# Patient Record
Sex: Female | Born: 1940 | Race: White | Hispanic: No | Smoking: Current every day smoker
Health system: Southern US, Community
[De-identification: ages and names within clinical notes are randomized; demographics above are authoritative.]

## PROBLEM LIST (undated history)

## (undated) DIAGNOSIS — R7309 Other abnormal glucose: Secondary | ICD-10-CM

## (undated) DIAGNOSIS — Z720 Tobacco use: Secondary | ICD-10-CM

## (undated) DIAGNOSIS — I700 Atherosclerosis of aorta: Secondary | ICD-10-CM

## (undated) DIAGNOSIS — R52 Pain, unspecified: Secondary | ICD-10-CM

## (undated) DIAGNOSIS — I2510 Atherosclerotic heart disease of native coronary artery without angina pectoris: Secondary | ICD-10-CM

## (undated) DIAGNOSIS — R0600 Dyspnea, unspecified: Secondary | ICD-10-CM

## (undated) DIAGNOSIS — M1990 Unspecified osteoarthritis, unspecified site: Secondary | ICD-10-CM

## (undated) DIAGNOSIS — J449 Chronic obstructive pulmonary disease, unspecified: Secondary | ICD-10-CM

## (undated) HISTORY — DX: Atherosclerotic heart disease of native coronary artery without angina pectoris: I25.10

## (undated) HISTORY — DX: Unspecified osteoarthritis, unspecified site: M19.90

## (undated) HISTORY — DX: Other abnormal glucose: R73.09

## (undated) HISTORY — DX: Chronic obstructive pulmonary disease, unspecified: J44.9

## (undated) HISTORY — PX: MULTIPLE TOOTH EXTRACTIONS: SHX2053

## (undated) HISTORY — DX: Tobacco use: Z72.0

## (undated) HISTORY — DX: Atherosclerosis of aorta: I70.0

## (undated) HISTORY — PX: EYE SURGERY: SHX253

---
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
## 1966-07-22 HISTORY — PX: OTHER SURGICAL HISTORY: SHX169

## 1974-07-22 HISTORY — PX: ABDOMINAL HYSTERECTOMY: SHX81

## 1983-07-23 HISTORY — PX: OTHER SURGICAL HISTORY: SHX169

## 1988-07-22 HISTORY — PX: BREAST SURGERY: SHX581

## 1988-07-22 HISTORY — PX: MASTECTOMY: SHX3

## 1998-10-20 ENCOUNTER — Encounter: Payer: Self-pay | Admitting: Internal Medicine

## 1998-10-20 ENCOUNTER — Ambulatory Visit (HOSPITAL_COMMUNITY): Admission: RE | Admit: 1998-10-20 | Discharge: 1998-10-20 | Payer: Self-pay | Admitting: Internal Medicine

## 1999-12-07 ENCOUNTER — Encounter: Payer: Self-pay | Admitting: Internal Medicine

## 1999-12-07 ENCOUNTER — Ambulatory Visit (HOSPITAL_COMMUNITY): Admission: RE | Admit: 1999-12-07 | Discharge: 1999-12-07 | Payer: Self-pay | Admitting: Internal Medicine

## 2000-12-26 ENCOUNTER — Ambulatory Visit (HOSPITAL_COMMUNITY): Admission: RE | Admit: 2000-12-26 | Discharge: 2000-12-26 | Payer: Self-pay | Admitting: Internal Medicine

## 2000-12-26 ENCOUNTER — Encounter: Payer: Self-pay | Admitting: Internal Medicine

## 2001-09-25 ENCOUNTER — Encounter: Payer: Self-pay | Admitting: Internal Medicine

## 2001-09-25 ENCOUNTER — Ambulatory Visit (HOSPITAL_COMMUNITY): Admission: RE | Admit: 2001-09-25 | Discharge: 2001-09-25 | Payer: Self-pay | Admitting: Internal Medicine

## 2002-01-01 ENCOUNTER — Encounter: Payer: Self-pay | Admitting: Internal Medicine

## 2002-01-01 ENCOUNTER — Ambulatory Visit (HOSPITAL_COMMUNITY): Admission: RE | Admit: 2002-01-01 | Discharge: 2002-01-01 | Payer: Self-pay | Admitting: Internal Medicine

## 2002-10-15 ENCOUNTER — Ambulatory Visit (HOSPITAL_COMMUNITY): Admission: RE | Admit: 2002-10-15 | Discharge: 2002-10-15 | Payer: Self-pay | Admitting: Internal Medicine

## 2002-10-15 ENCOUNTER — Encounter: Payer: Self-pay | Admitting: Internal Medicine

## 2003-01-28 ENCOUNTER — Ambulatory Visit (HOSPITAL_COMMUNITY): Admission: RE | Admit: 2003-01-28 | Discharge: 2003-01-28 | Payer: Self-pay | Admitting: Internal Medicine

## 2003-01-28 ENCOUNTER — Encounter: Payer: Self-pay | Admitting: Internal Medicine

## 2003-07-23 HISTORY — PX: BACK SURGERY: SHX140

## 2003-10-28 ENCOUNTER — Ambulatory Visit (HOSPITAL_COMMUNITY): Admission: RE | Admit: 2003-10-28 | Discharge: 2003-10-28 | Payer: Self-pay

## 2004-02-14 ENCOUNTER — Ambulatory Visit (HOSPITAL_COMMUNITY): Admission: RE | Admit: 2004-02-14 | Discharge: 2004-02-14 | Payer: Self-pay | Admitting: Internal Medicine

## 2004-03-06 ENCOUNTER — Ambulatory Visit (HOSPITAL_COMMUNITY): Admission: RE | Admit: 2004-03-06 | Discharge: 2004-03-06 | Payer: Self-pay | Admitting: Internal Medicine

## 2004-03-14 ENCOUNTER — Ambulatory Visit (HOSPITAL_COMMUNITY): Admission: RE | Admit: 2004-03-14 | Discharge: 2004-03-14 | Payer: Self-pay | Admitting: Internal Medicine

## 2004-07-22 HISTORY — PX: CHOLECYSTECTOMY: SHX55

## 2004-12-14 ENCOUNTER — Ambulatory Visit (HOSPITAL_COMMUNITY): Admission: RE | Admit: 2004-12-14 | Discharge: 2004-12-14 | Payer: Self-pay | Admitting: Internal Medicine

## 2005-01-24 ENCOUNTER — Ambulatory Visit (HOSPITAL_COMMUNITY): Admission: RE | Admit: 2005-01-24 | Discharge: 2005-01-24 | Payer: Self-pay | Admitting: Internal Medicine

## 2005-02-14 ENCOUNTER — Encounter: Admission: RE | Admit: 2005-02-14 | Discharge: 2005-03-06 | Payer: Self-pay | Admitting: Family Medicine

## 2005-02-26 ENCOUNTER — Ambulatory Visit (HOSPITAL_COMMUNITY): Admission: RE | Admit: 2005-02-26 | Discharge: 2005-02-26 | Payer: Self-pay | Admitting: Internal Medicine

## 2005-04-28 ENCOUNTER — Emergency Department (HOSPITAL_COMMUNITY): Admission: EM | Admit: 2005-04-28 | Discharge: 2005-04-28 | Payer: Self-pay | Admitting: Emergency Medicine

## 2005-05-10 ENCOUNTER — Ambulatory Visit: Payer: Self-pay | Admitting: Hematology and Oncology

## 2005-05-16 ENCOUNTER — Encounter: Payer: Self-pay | Admitting: Hematology and Oncology

## 2005-05-16 ENCOUNTER — Other Ambulatory Visit: Admission: RE | Admit: 2005-05-16 | Discharge: 2005-05-16 | Payer: Self-pay | Admitting: Hematology and Oncology

## 2005-07-22 HISTORY — PX: SHOULDER SURGERY: SHX246

## 2005-11-02 ENCOUNTER — Encounter: Admission: RE | Admit: 2005-11-02 | Discharge: 2005-11-02 | Payer: Self-pay | Admitting: Sports Medicine

## 2005-11-20 ENCOUNTER — Ambulatory Visit (HOSPITAL_BASED_OUTPATIENT_CLINIC_OR_DEPARTMENT_OTHER): Admission: RE | Admit: 2005-11-20 | Discharge: 2005-11-20 | Payer: Self-pay | Admitting: Orthopedic Surgery

## 2006-03-28 ENCOUNTER — Ambulatory Visit (HOSPITAL_COMMUNITY): Admission: RE | Admit: 2006-03-28 | Discharge: 2006-03-28 | Payer: Self-pay | Admitting: Internal Medicine

## 2006-05-06 ENCOUNTER — Encounter: Admission: RE | Admit: 2006-05-06 | Discharge: 2006-05-06 | Payer: Self-pay | Admitting: Internal Medicine

## 2006-05-16 ENCOUNTER — Ambulatory Visit (HOSPITAL_COMMUNITY): Admission: RE | Admit: 2006-05-16 | Discharge: 2006-05-16 | Payer: Self-pay | Admitting: General Surgery

## 2006-07-22 HISTORY — PX: CHOLECYSTECTOMY: SHX55

## 2006-07-30 ENCOUNTER — Encounter (INDEPENDENT_AMBULATORY_CARE_PROVIDER_SITE_OTHER): Payer: Self-pay | Admitting: Specialist

## 2006-07-30 ENCOUNTER — Ambulatory Visit (HOSPITAL_COMMUNITY): Admission: RE | Admit: 2006-07-30 | Discharge: 2006-07-30 | Payer: Self-pay | Admitting: General Surgery

## 2006-12-30 ENCOUNTER — Ambulatory Visit (HOSPITAL_COMMUNITY): Admission: RE | Admit: 2006-12-30 | Discharge: 2006-12-30 | Payer: Self-pay | Admitting: Internal Medicine

## 2007-01-12 ENCOUNTER — Encounter: Admission: RE | Admit: 2007-01-12 | Discharge: 2007-01-12 | Payer: Self-pay | Admitting: Internal Medicine

## 2007-04-23 ENCOUNTER — Encounter: Admission: RE | Admit: 2007-04-23 | Discharge: 2007-04-23 | Payer: Self-pay | Admitting: Internal Medicine

## 2008-03-27 ENCOUNTER — Encounter: Admission: RE | Admit: 2008-03-27 | Discharge: 2008-03-27 | Payer: Self-pay | Admitting: Orthopedic Surgery

## 2008-05-03 ENCOUNTER — Inpatient Hospital Stay (HOSPITAL_COMMUNITY): Admission: RE | Admit: 2008-05-03 | Discharge: 2008-05-04 | Payer: Self-pay | Admitting: Neurosurgery

## 2009-03-03 ENCOUNTER — Ambulatory Visit (HOSPITAL_COMMUNITY): Admission: RE | Admit: 2009-03-03 | Discharge: 2009-03-03 | Payer: Self-pay | Admitting: Internal Medicine

## 2009-12-12 ENCOUNTER — Ambulatory Visit: Admission: RE | Admit: 2009-12-12 | Discharge: 2009-12-12 | Payer: Self-pay | Admitting: Internal Medicine

## 2009-12-14 ENCOUNTER — Encounter: Payer: Self-pay | Admitting: Internal Medicine

## 2009-12-14 ENCOUNTER — Encounter: Admission: RE | Admit: 2009-12-14 | Discharge: 2009-12-14 | Payer: Self-pay | Admitting: Internal Medicine

## 2010-01-01 ENCOUNTER — Ambulatory Visit (HOSPITAL_COMMUNITY): Admission: RE | Admit: 2010-01-01 | Discharge: 2010-01-01 | Payer: Self-pay | Admitting: Internal Medicine

## 2010-01-12 ENCOUNTER — Ambulatory Visit: Payer: Self-pay | Admitting: Internal Medicine

## 2010-01-12 DIAGNOSIS — F17200 Nicotine dependence, unspecified, uncomplicated: Secondary | ICD-10-CM | POA: Insufficient documentation

## 2010-01-12 DIAGNOSIS — I10 Essential (primary) hypertension: Secondary | ICD-10-CM | POA: Insufficient documentation

## 2010-01-12 DIAGNOSIS — E782 Mixed hyperlipidemia: Secondary | ICD-10-CM

## 2010-01-18 DIAGNOSIS — J302 Other seasonal allergic rhinitis: Secondary | ICD-10-CM

## 2010-01-18 DIAGNOSIS — J3089 Other allergic rhinitis: Secondary | ICD-10-CM

## 2010-02-12 ENCOUNTER — Ambulatory Visit: Payer: Self-pay | Admitting: Internal Medicine

## 2010-04-13 ENCOUNTER — Ambulatory Visit: Payer: Self-pay | Admitting: Internal Medicine

## 2010-04-30 ENCOUNTER — Ambulatory Visit (HOSPITAL_COMMUNITY): Admission: RE | Admit: 2010-04-30 | Discharge: 2010-04-30 | Payer: Self-pay | Admitting: Internal Medicine

## 2010-08-21 NOTE — Miscellaneous (Signed)
Summary: Orders Update pft charges  Clinical Lists Changes  Orders: Added new Service order of Carbon Monoxide diffusing w/capacity (94720) - Signed Added new Service order of Lung Volumes (94240) - Signed Added new Service order of Spirometry (Pre & Post) (94060) - Signed 

## 2010-08-21 NOTE — Assessment & Plan Note (Signed)
Summary: [name]///[name]   Primary Tiane Szydlowski/Referring Freeda Spivey:  Dr Oneta Rack  CC:  2 month follow up visit-Review PFT.[name]  History of Present Illness: History of Present Illness: January 12, 2010- 70yoF smoker seen courtesy of Dr Oneta Rack with complaint of cough. Long hx of chronic bronchitis. sinus congestion each witner. Denies hx asthma. Says she had pneumonia 2 weeks ago- given antibiotic and prednisone, but this time cough didn't clear. Worse at night and supine. Postnasal drip and easy dyspnea on exertion. Denies reflux. Taking ACE inhibitor. Denies blood, fever, swollen glands or chest pain. No cough with meals. CXR- 12/12/09- lingular streaking CT angio- 12/14/09- No PE, diffuse COPD. Mild cardiomegaly. No acute process or infiltrate.  February 12, 2010-  Allergic rhinitis, Tobacco, COPD Gets bronchitis easily. says in the last week she has been wheezing more, bvut otherwise she had  been breathing better since last here.  Sample Benicar seemed less associated with cough than her enalapril. She will follow up with Dr Oneta Rack for this long term. Spiriva sample helped. PFT was due today, but delayed because machine is being repaired. Still smoking 8 cigs/ day. We discussed gradually weaning off.  April 13, 2010- Allergic rhinitis, COPD, tobacco Feels ok. Hot summer was smothery and took Spiriva more regularly. Now uses it some days but doesn't always feel the need. Has a sample Ventolin HFA she uses up to two times a day some days, none on other days. Coughs only some days, not always productive. Denies being awakened, chest pain. Does get occasional palpitation./ skipped beat. Nasal congestion continues to bother her. Eyes itch. Has to pace hereslf, sit and rest while housecleaning or out shopping. Remembers Advair helping in past when she was given it during spell of bronchits.  PFT- mild obstructive disease w/ response to BD, DLCO severely reduced.   Preventive Screening-Counseling &  Management  Alcohol-Tobacco     Smoking Status: current     Smoking Cessation Counseling: yes     Smoke Cessation Stage: contemplative     Packs/Day: <0.25     Pack years: 52 years     Tobacco Counseling: to quit use of tobacco products  Current Medications (verified): 1)  Furosemide 40 Mg Tabs (Furosemide) .... Take 1 By Mouth Once Daily 2)  Losartan Potassium 100 Mg Tabs (Losartan Potassium) .... Take 1 By Mouth Once Daily 3)  Zetia 10 Mg Tabs (Ezetimibe) .... Take 1 By Mouth Once Daily 4)  Aspirin 81 Mg Tbec (Aspirin) .... Take 1 By Mouth Once Daily 5)  Fenofibrate Micronized 134 Mg Caps (Fenofibrate Micronized) .... Take 1 By Mouth Once Daily 6)  Magnesium Oxide 400 Mg Tabs (Magnesium Oxide) .... With Bone Health Take 1 By Mouth Once Daily 7)  Fish Oil 1000 Mg Caps (Omega-3 Fatty Acids) .... Take 1 By Mouth Two Times A Day 8)  Vitamin D3 2000 Unit Caps (Cholecalciferol) .... Take 2 By Mouth Once Daily 9)  Flax Seed Oil 1000 Mg Caps (Flaxseed (Linseed)) .... Take 1 By Mouth Three Times A Day 10)  Spiriva Handihaler 18 Mcg Caps (Tiotropium Bromide Monohydrate) .[name].. 1 Daily  Allergies (verified): 1)  ! * Acyclovir  Past History:  Past Medical History: Last updated: 01/12/2010 C V A / Stroke-1985 Hypertension Hyperlipidemia Tobacco Breast cancer, hx Allergic Rhinitis  Past Surgical History: Last updated: 01/12/2010 Back surgery-2005 Right mastectomy 1990, no adjuvant Cholecystectomy-2006 Hysterectomy-1976  Family History: Last updated: 01/12/2010 Emphysema/COPD-2 brothers, sister Allergies:all brothers and sisters Asthma: 2 sisters Heart Disease: mother, sisters,  brothers Cancer: pt(breast) mother(breast), 3 sisters(breast)  Social History: Last updated: 01/12/2010 married with children smokes age 70 to present 1/2 ppd 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  The patient denies shortness of breath at rest, non-productive cough, coughing up blood, chest pain, irregular heartbeats, acid heartburn, indigestion, loss of appetite, weight change, abdominal pain, difficulty swallowing, sore throat, tooth/dental problems, headaches, sneezing, itching, ear ache, rash, and fever.    Vital Signs:  Patient profile:   70 year old female Height:      64 inches Weight:      166 pounds BMI:     28.60 O2 Sat:      94 % on Room air Pulse rate:   72 / minute BP sitting:   124 / 76  (left arm) Cuff size:   regular  Vitals Entered By: Reynaldo Minium CMA (April 13, 2010 3:56 PM)  O2 Flow:  Room air CC: 2 month follow up visit-Review PFT.   Physical Exam  Additional Exam:  General: A/Ox3; pleasant and cooperative, NAD, SKIN: no rash, lesions NODES: no lymphadenopathy HEENT: [place]/AT, EOM- WNL, Conjuctivae- clear, PERRLA, TM-WNL, Nose- clear, Throat- clear and wnl, Mallampati  II NECK: Supple w/ fair ROM, JVD- none, normal carotid impulses w/o bruits Thyroid- CHEST: Decreased, mild lose cough, R mastectomy HEART: RRR, no m/g/r heard ABDOMEN: Soft and nl; JYN:WGNF, nl pulses, no edema . Right arm in elastic sleeve. NEURO: Grossly intact to observation      Impression & Recommendations:  Problem # 1:  COPD (ICD-496) I believe she has more COPD, as indicated by mild reversibility and her reduced DLCO, than the spirometry flows would suggest. She lives in [place] and doesn't have transportation to permit consideration of the [employer].  I will encourage her to walk a lot. We will let her try Advair again to see if it adds value.  Flu vax.  Problem # 2:  TOBACCO USER (ICD-305.1) She must stop smoking. I've discussed available support and suggested she use the patches.  Problem # 3:  ALLERGIC  RHINITIS (ICD-477.9) Recent seasonal exacerbation with nasal congestin and itching eyes. We are at peak ragweed levels now. Antinhistamines will likely be sufficient to get her through.  Medications Added to Medication List This Visit: 1)  Losartan Potassium 100 Mg Tabs (Losartan potassium) .... Take 1 by mouth once daily 2)  Advair Diskus 100-50 Mcg/dose Aepb (Fluticasone-salmeterol) .[name].. 1 puff and rinse mouth, twice daily 3)  Ventolin Hfa 108 (90 Base) Mcg/act Aers (Albuterol sulfate) .... 2 puffs four times a day as needed rescue inhaler  Other Orders: Est. Patient Level IV (62130)  Patient Instructions: 1)  Please schedule a follow-up appointment in 6 Gray. 2)  Sample / Script Advair 100/50 3)    1 puff and rinse mouth, twice  daily 4)     5)  Continue Spiriva, once daily 6)  Continue Ventolin HFA rescue inhaler/ albuterol 7)    2 puffs up to 4 x daily if needed 8)  Please keep trying to stop smoking 9)  Flu vax Prescriptions: VENTOLIN HFA 108 (90 BASE) MCG/ACT AERS (ALBUTEROL SULFATE) 2 puffs four times a day as needed rescue inhaler  #1 x prn   Entered and Authorized  by:   Waymon Budge MD   Signed by:   Waymon Budge MD on 04/13/2010   Method used:   Print then Give to Patient   RxID:   [id number] ADVAIR DISKUS 100-50 MCG/DOSE AEPB (FLUTICASONE-SALMETEROL) 1 puff and rinse mouth, twice daily  #1 x prn   Entered and Authorized by:   Waymon Budge MD   Signed by:   Waymon Budge MD on 04/13/2010   Method used:   Print then Give to Patient   RxID:   [id number]    Immunization History:  Influenza Immunization History:    Influenza:  historical (04/23/2009)  Pneumovax Immunization History:    Pneumovax:  historical (01/20/2010)

## 2010-08-21 NOTE — Assessment & Plan Note (Signed)
Summary: ROV AFTER PFT ///kp   Primary Provider/Referring Provider:  Dr Posey Boyer  CC:  1 month follow up.  Breathing and cough have improved however still has a dry cough and a lot of wheezing x 1 wk.  Still smoking 8-10 cig/day.Marland Kitchen  History of Present Illness: History of Present Illness: January 12, 2010- 70yoF smoker seen courtesy of Dr Oneta Rack with complaint of cough. Long hx of chronic bronchitis. sinus congestion each witner. Denies hx asthma. Says she had pneumonia 2 weeks ago- given antibiotic and prednisone, but this time cough didn't clear. Worse at night and supine. Postnasal drip and easy dyspnea on exertion. Denies reflux. Taking ACE inhibitor. Denies blood, fever, swollen glands or chest pain. No cough with meals. CXR- 12/12/09- lingular streaking CT angio- 12/14/09- No PE, diffuse COPD. Mild cardiomegaly. No acute process or infiltrate.  February 12, 2010-  Allergic rhinitis, Tobacco, COPD Gets bronchitis easily. says in the last week she has been wheezing more, bvut otherwise she had  been breathing better since last here.  Sample Benicar seemed less associated with cough than her enalapril. She will follow up with Dr Oneta Rack for this long term. Spiriva sample helped. PFT was due today, but delayed because machine is being repaired. Still smoking 8 cigs/ day. We discussed gradually weaning off.    Preventive Screening-Counseling & Management  Alcohol-Tobacco     Smoking Status: current     Smoke Cessation Stage: contemplative     Packs/Day: <0.25  Comments: Trying to quit now.  Current Medications (verified): 1)  Furosemide 40 Mg Tabs (Furosemide) .... Take 1 By Mouth Once Daily 2)  Benicar 20 Mg Tabs (Olmesartan Medoxomil) .... Take 1 Tablet By Mouth Once A Day 3)  Zetia 10 Mg Tabs (Ezetimibe) .... Take 1 By Mouth Once Daily 4)  Aspirin 81 Mg Tbec (Aspirin) .... Take 1 By Mouth Once Daily 5)  Fenofibrate Micronized 134 Mg Caps (Fenofibrate Micronized) .... Take 1 By Mouth  Once Daily 6)  Magnesium Oxide 400 Mg Tabs (Magnesium Oxide) .... With Bone Health Take 1 By Mouth Once Daily 7)  Fish Oil 1000 Mg Caps (Omega-3 Fatty Acids) .... Take 1 By Mouth Two Times A Day 8)  Vitamin D3 2000 Unit Caps (Cholecalciferol) .... Take 2 By Mouth Once Daily 9)  Flax Seed Oil 1000 Mg Caps (Flaxseed (Linseed)) .... Take 1 By Mouth Three Times A Day  Allergies (verified): 1)  ! * Acyclovir  Past History:  Past Medical History: Last updated: 01/12/2010 C V A / Stroke-1985 Hypertension Hyperlipidemia Tobacco Breast cancer, hx Allergic Rhinitis  Past Surgical History: Last updated: 01/12/2010 Back surgery-2005 Right mastectomy 1990, no adjuvant Cholecystectomy-2006 Hysterectomy-1976  Family History: Last updated: 01/12/2010 Emphysema/COPD-2 brothers, sister Allergies:all brothers and sisters Asthma: 2 sisters Heart Disease: mother, sisters, brothers Cancer: pt(breast) mother(breast), 3 sisters(breast)  Social History: Last updated: 01/12/2010 married with children smokes age 65 to present 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  The patient denies shortness of breath at rest, productive cough, non-productive cough, coughing up blood, chest pain, acid heartburn, indigestion, loss of appetite, weight change, abdominal pain, difficulty swallowing, sore throat, tooth/dental problems, headaches, nasal congestion/difficulty breathing through nose, and sneezing.    Vital Signs:  Patient profile:  70 year old female Height:      64 inches Weight:      165.25 pounds BMI:     28.47 O2 Sat:      97 % on Room air Pulse rate:   55 / minute BP sitting:   110 / 62  (left arm) Cuff size:   regular  Vitals Entered By: Gweneth Dimitri RN (February 12, 2010 4:14 PM)  O2 Flow:  Room air CC: 1 month follow up.  Breathing and cough have improved however still has a dry cough and a lot of wheezing x 1 wk.  Still smoking 8-10 cig/day. Comments Medications reviewed with patient Daytime contact number verified with patient. Gweneth Dimitri RN  February 12, 2010 4:16 PM    Physical Exam  Additional Exam:  General: A/Ox3; pleasant and cooperative, NAD, SKIN: no rash, lesions NODES: no lymphadenopathy HEENT: [place]/AT, EOM- WNL, Conjuctivae- clear, PERRLA, TM-WNL, Nose- clear, Throat- clear and wnl NECK: Supple w/ fair ROM, JVD- none, normal carotid impulses w/o bruits Thyroid- CHEST: Clear to P&A, no wheeze or cough here, R mastectomy HEART: RRR, no m/g/r heard ABDOMEN: Soft and nl; ZOX:WRUE, nl pulses, no edema . Right arm in elastic sleeve. NEURO: Grossly intact to observation      Impression & Recommendations:  Problem # 1:  COPD (ICD-496) We will refill Spiriva and reschedule her PFT Cough almost certainly was aggravated by enalapril based on her observation. We will give more  Benicar samples, but ask her to talk with Dr Oneta Rack about his long term preference.,  Problem # 2:  TOBACCO USER (ICD-305.1)  We talked again today about smoking cessation. She may be at a point where she can taper by 1-2 cigarettes/ week.  Problem # 3:  ALLERGIC RHINITIS (ICD-477.9)  Now is not peak seasonal allergy time. We will manage symptomatically as needed.  Medications Added to Medication List This Visit: 1)  Benicar 20 Mg Tabs (Olmesartan medoxomil) .... Take 1 tablet by mouth once a day 2)  Spiriva Handihaler 18 Mcg Caps (Tiotropium bromide monohydrate) .Marland Kitchen.. 1 daily  Other Orders: Est. Patient Level III (45409)  Patient Instructions: 1)  Please schedule a follow-up appointment in 2 months. 2)  Reschedule PFT to be done when machine is working again. 3)  Please keep trying to drop off those last few cigarettes. 4)  Sample and  script Spriva: 1 daily 5)  samples Benicar 20 x 3 weeks. One daily. Please ask Dr Oneta Rack what to do since the enalapril caused you to cough. Prescriptions: SPIRIVA HANDIHALER 18 MCG CAPS (TIOTROPIUM BROMIDE MONOHYDRATE) 1 daily  #30 x prn   Entered and Authorized by:   Waymon Budge MD   Signed by:   Waymon Budge MD on 02/12/2010   Method used:   Print then Give to Patient   RxID:   [id number]

## 2010-08-21 NOTE — Assessment & Plan Note (Signed)
Summary: chronic cough/ mbw   Vital Signs:  Patient profile:   70 year old female Weight:      162 pounds O2 Sat:      94 % on Room air Pulse rate:   60 / minute BP sitting:   124 / 84  (left arm) Cuff size:   regular  Vitals Entered By: Reynaldo Minium CMA (January 12, 2010 3:28 PM)  O2 Flow:  Room air CC: Pulmonary consult-Chronic cough-Dr. Oneta Rack.   Primary Provider/Referring Provider:  Dr Posey Boyer  CC:  Pulmonary consult-Chronic cough-Dr. Oneta Rack.Marland Kitchen  History of Present Illness: January 12, 2010- 68yoF smoker seen courtesy of Dr Oneta Rack with complaint of cough. Long hx of chronic bronchitis. sinus congestion each witner. Denies hx asthma. Says she had pneumonia 2 weeks ago- given antibiotic and prednisone, but this time cough didn't clear. Worse at night and supine. Postnasal drip and easy dyspnea on exertion. Denies reflux. Taking ACE inhibitor. Denies blood, fever, swollen glands or chest pain. No cough with meals. CXR- 12/12/09- lingular streaking CT angio- 12/14/09- No PE, diffuse COPD. Mild cardiomegaly. No acute process or infiltrate.   Preventive Screening-Counseling & Management  Alcohol-Tobacco     Smoking Status: current     Smoking Cessation Counseling: yes     Smoke Cessation Stage: contemplative     Packs/Day: 0.5     Pack years: 52 years     Tobacco Counseling: to quit use of tobacco products  Current Medications (verified): 1)  Furosemide 40 Mg Tabs (Furosemide) .... Take 1 By Mouth Once Daily 2)  Enalapril Maleate 20 Mg Tabs (Enalapril Maleate) .... Take 1 By Mouth Once Daily 3)  Zetia 10 Mg Tabs (Ezetimibe) .... Take 1 By Mouth Once Daily 4)  Aspirin 81 Mg Tbec (Aspirin) .... Take 1 By Mouth Once Daily 5)  Fenofibrate Micronized 134 Mg Caps (Fenofibrate Micronized) .... Take 1 By Mouth Once Daily 6)  Magnesium Oxide 400 Mg Tabs (Magnesium Oxide) .... With Bone Health Take 1 By Mouth Once Daily 7)  Fish Oil 1000 Mg Caps (Omega-3 Fatty Acids) .... Take 1 By Mouth  Two Times A Day 8)  Vitamin D3 2000 Unit Caps (Cholecalciferol) .... Take 2 By Mouth Once Daily 9)  Flax Seed Oil 1000 Mg Caps (Flaxseed (Linseed)) .... Take 1 By Mouth Three Times A Day  Allergies (verified): 1)  ! 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: Smoking Status: current (01/12/2010) Packs/Day: 0.5 (01/12/2010)  Past Medical History: C V A / Stroke-1985 Hypertension Hyperlipidemia Tobacco Breast cancer, hx Allergic Rhinitis  Past Surgical History: Back surgery-2005 Right mastectomy 1990, no adjuvant Cholecystectomy-2006 Hysterectomy-1976  Family History: Emphysema/COPD-2 brothers, sister Allergies:all brothers and sisters Asthma: 2 sisters Heart Disease: mother, sisters, brothers Cancer: pt(breast) mother(breast), 3 sisters(breast)  Social History: married with children smokes age 89 to present 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  The patient denies shortness of breath at rest, non-productive cough, coughing up blood,  chest pain, irregular heartbeats, acid heartburn, indigestion, loss of appetite, abdominal pain, difficulty swallowing, sore throat, tooth/dental problems, headaches, itching, ear ache, anxiety, depression, hand/feet swelling, joint stiffness or pain, rash, change in color of mucus, and fever.         Weight down 10 lbs  Physical  Exam  Additional Exam:  General: A/Ox3; pleasant and cooperative, NAD, SKIN: no rash, lesions NODES: no lymphadenopathy HEENT: [place]/AT, EOM- WNL, Conjuctivae- clear, PERRLA, TM-WNL, Nose- clear, Throat- clear and wnl NECK: Supple w/ fair ROM, JVD- none, normal carotid impulses w/o bruits Thyroid- normal to palpation CHEST: Clear to P&A, no wheeze or cough here, R mastectomy HEART: RRR, no m/g/r heard ABDOMEN: Soft and nl; nml bowel sounds; no organomegaly or masses noted ZOX:WRUE, nl pulses, no edema . Right arm in elastic sleeve. NEURO: Grossly intact to observation      Impression & Recommendations:  Problem # 1:  TOBACCO USER (ICD-305.1)  We began discussion. Cessation will be key, and may make the difference with her cough as well.  Problem # 2:  COUGH (ICD-786.2)  Nonspecific. Worse wih supine position, which can indicate reflux, orthopnea or postnasal drip mechanism. I don't get hx suggesting reflux or aspiration otherwise. Of concern in a smoker and I discussed that with her, noting recent CT does not suggest an area of particular concern. ACE inhibitor cough can begin after a trigger like an acute bronchits, then persist. We will give sample of  Benicar to try for several weeks as an alternative to see if that helps.  Orders: Consultation Level IV (45409)  Problem # 3:  COPD (ICD-496) She probably has significant COPD, with evidece as described on her CT. We will schedule PFT and give sample trial of Spriva.  Medications Added to Medication List This Visit: 1)  Furosemide 40 Mg Tabs (Furosemide) .... Take 1 by mouth once daily 2)  Enalapril Maleate 20 Mg Tabs (Enalapril maleate) .... Take 1 by mouth once daily 3)  Zetia 10 Mg Tabs (Ezetimibe) .... Take 1 by mouth once daily 4)  Aspirin 81 Mg Tbec (Aspirin) .... Take 1 by mouth once daily 5)  Fenofibrate Micronized 134 Mg Caps (Fenofibrate micronized) .... Take 1 by mouth once daily 6)  Magnesium Oxide 400 Mg Tabs  (Magnesium oxide) .... With bone health take 1 by mouth once daily 7)  Fish Oil 1000 Mg Caps (Omega-3 fatty acids) .... Take 1 by mouth two times a day 8)  Vitamin D3 2000 Unit Caps (Cholecalciferol) .... Take 2 by mouth once daily 9)  Flax Seed Oil 1000 Mg Caps (Flaxseed (linseed)) .... Take 1 by mouth three times a day  Patient Instructions: 1)  Please schedule a follow-up appointment in 1 month. 2)  Schedule PFT 3)  Please try to stop smoking. the otc 21 mg nicotine patches can be a good place to start. 4)  sample Spiriva - 1 daily - see if it helps your breathing 5)  sample Benicar 20 mg, 2-3 weeks supply. Try this once daily instead of enalapril to see if cough gets better away from enalapril 6)  Copy sent to:[facility]

## 2010-10-01 ENCOUNTER — Encounter: Payer: Self-pay | Admitting: Internal Medicine

## 2010-10-11 ENCOUNTER — Ambulatory Visit (INDEPENDENT_AMBULATORY_CARE_PROVIDER_SITE_OTHER): Payer: Medicare Other | Admitting: Internal Medicine

## 2010-10-11 ENCOUNTER — Encounter: Payer: Self-pay | Admitting: Internal Medicine

## 2010-10-11 VITALS — BP 120/64 | HR 61 | Ht 64.0 in | Wt 170.8 lb

## 2010-10-11 DIAGNOSIS — F17200 Nicotine dependence, unspecified, uncomplicated: Secondary | ICD-10-CM

## 2010-10-11 DIAGNOSIS — J4489 Other specified chronic obstructive pulmonary disease: Secondary | ICD-10-CM

## 2010-10-11 DIAGNOSIS — J309 Allergic rhinitis, unspecified: Secondary | ICD-10-CM

## 2010-10-11 DIAGNOSIS — J449 Chronic obstructive pulmonary disease, unspecified: Secondary | ICD-10-CM

## 2010-10-11 NOTE — Assessment & Plan Note (Signed)
We will continue to emphasize cessation support and techniques and will give her info on the [facility] smoking cessation program.

## 2010-10-11 NOTE — Progress Notes (Signed)
  Subjective:    Patient ID: [name], female    DOB: 1940/12/06, 70 y.o.   MRN: [id number]  HPI 7 yoF 1/2 PPD smoker returning for f/u of COPD and allergic rhinitis. PFT 2011 showed mild obstruction w/ severe reduction DLCO. Wears elastic sleeve right arm since remote mastectomy. Lat here September 23,2011. She and her husband shared a chest cold 1 month ago, resolved back to baseline w/o presciption med. She admits frequent wheeze. Gets short of breath on long walks, housework.  Uses Albuterol about 3 x/ daily, but hasn't used Advair saying "I didn't know what it was for". Little change day to day or since last here.  Having Spring seasonal nasal congestion and blowing for the past month. Eyes itch. Tried benadryl, but made her too sleepy.  Denies chest pains, blood or nodes. Occasional palpitation.  She would like to stop smoking, but hasn't tried even patches.   Review of Systems    Constitutional:   No weight loss, night sweats,  Fevers, chills, fatigue, lassitude. HEENT:   No headaches,  Difficulty swallowing,  Tooth/dental problems,  Sore throat,                No sneezing, itching, ear ache, nasal congestion, post nasal drip,   CV:  No chest pain,  Orthopnea, PND, swelling in lower extremities, anasarca, dizziness, palpitations  GI  No heartburn, indigestion, abdominal pain, nausea, vomiting, diarrhea, change in bowel habits, loss of appetite  Resp: No shortness of breath with exertion or at rest.  No excess mucus, no productive cough,  No non-productive cough,  No coughing up of blood.  No change in color of mucus.  No wheezing.  No chest wall deformity  Skin: no rash or lesions.  GU: no dysuria, change in color of urine, no urgency or frequency.  No flank pain.  MS:  No joint pain or swelling.  No decreased range of motion.  No back pain.  Psych:  No change in mood or affect. No depression or anxiety.  No memory loss.  Objective:   Physical Exam    General- Alert,  Oriented, Affect-appropriate, Distress- none acute   Room smells of tobacco  Skin- rash-none, lesions- none, excoriation- none.    Prematurely aged  Lymphadenopathy- none  Head- atraumatic  Eyes- Gross vision intact, PERRLA, conjunctivae clear, secretions  Ears- Hearing, canals, Tm L ,   R ,  Nose- Stuffy, No-  Septal dev, mucus, polyps, erosion, perforation   Throat- Mallampati II , mucosa clear , drainage- none, tonsils- atrophic  Neck- flexible , trachea midline, no stridor , thyroid nl, carotid no bruit  Chest - symmetrical excursion , unlabored       Heart/CV- RRR , no murmur , no gallop  , no rub, nl s1 s2                     - JVD- none , edema- none, stasis changes- none, varices- none     Lung- diminished, wheeze- none, cough- none , dullness-none, rub- none     Chest wall- right mastectomy  Abd- tender-no, distended-no, bowel sounds-present, HSM- no  Br/ Gen/ Rectal- Not done, not indicated  Extrem- cyanosis- none, clubbing, none, atrophy- none, strength- nl  Neuro- grossly intact to observation      Assessment & Plan:

## 2010-10-11 NOTE — Assessment & Plan Note (Signed)
PFT consistent with emphysema pattern. I don't expect meds to make a lot of difference, but will let her continue Spiriva. And add a sample trial of Advair for a week.

## 2010-10-11 NOTE — Patient Instructions (Signed)
Please keep trying to stop smoking. It makes your nose and chest more congested and we don't want you to get worse lung and breathing problems.   Give information sheet on [facility] smoking Program  Continue Spiriva once daily  Give sample Advair 250/ 50   1 puff and rinse mouth, twice every day. See if the wheeze gets better.

## 2010-10-11 NOTE — Assessment & Plan Note (Signed)
Seasonal exacerbation. We educated on antihistamines vs decongestants and will give names of otc meds. 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

## 2010-12-04 NOTE — Op Note (Signed)
[name], [name] NO.:  [id number]   MEDICAL RECORD NO.:  [id number]          PATIENT TYPE:  INP   LOCATION:  2899                         FACILITY:  [facility]   PHYSICIAN:  Kathaleen Maser. Pool, M.D.    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   PROCEDURE:  L3-4, L4-5 decompressive laminectomy with L3, L4, and L5  decompressive foraminotomies, bilaterally.   SURGEON:  Kathaleen Maser. Pool, MD   ASSISTANT:  Reinaldo Meeker, MD   ANESTHESIA:  General endotracheal.   INDICATION:  [name] is a 70 year old female with history of back  and bilateral lower extremity pain, left greater than right, consistent  with neurogenic claudication.  Workup demonstrates evidence of severe  stenosis at L3-4 and L4-5.  The patient presents now for two-level  decompressed laminectomy and foraminotomies.   OPERATIVE NOTE:  [name] was placed on the operative table in supine  position.  After caudal anesthesia was achieved, the patient was prone  onto Wilson frame.  The patient's lumbar regions prepped and draped  sterilely.  A #10 blade was used to make a curvilinear skin incision  overlying the L3-4, L4-5 levels.  This carried down sharply in the  midline.  Subperiosteal dissection was then performed through the lamina  and facet joints L3-4, L4-5.  Deep self __________ placed.  Intraoperative x-rays taken, level was confirmed.  Decompressive  laminectomy was then performed using Leksell rongeurs, Kerrison  rongeurs, high-speed drill to remove the inferior two-thirds of the  lamina of L3, the entire lamina of L4, and the superior one-third of the  lamina of L5.  Medial facetectomies L3-4, L4-5 were also performed.  Ligament flavum was elevated and resected in the usual fashion using  Kerrison  rongeurs.  Underlying thecal sac and exiting L3, L4, and L5  nerve roots were identified bilaterally.  Decompressive foraminotomies  then performed along the course of exiting nerve roots bilaterally.  Disk space were inspected and found to be free of any soft and large  disk herniation.  __________ more kind of chronic spondylitic  protrusions.  At this point, __________achieved.  There is no injury to  thecal sac or nerve roots.  Wound was then irrigated with saline  solution.  Gelfoam was placed topically.  Hemostasis found to be good.  Hemovac drains left in the intervertebral space.  The wound was then  closed in layers with Vicryl suture.  Steri-Strips and sterile dressings  were applied.  The patient tolerated the procedure well and she returned  to the recovery room __________.           ______________________________  Kathaleen Maser. Pool, M.D.    HAP/MEDQ  D:  05/03/2008  T:  05/04/2008  Job:  [id number]

## 2010-12-07 NOTE — Op Note (Signed)
NAMEELERI, RUBEN                ACCOUNT NO.:  [id number]   MEDICAL RECORD NO.:  [id number]          PATIENT TYPE:  AMB   LOCATION:  DAY                          FACILITY:  [facility]   PHYSICIAN:  Timothy E. Earlene Plater, M.D. DATE OF BIRTH:  22-Oct-1940   DATE OF PROCEDURE:  07/30/2006  DATE OF DISCHARGE:                               OPERATIVE REPORT   PREOPERATIVE DIAGNOSIS:  Cholecystolithiasis.   POSTOPERATIVE DIAGNOSIS:  Cholecystolithiasis.   PROCEDURE:  Laparoscopic cholecystectomy and operative cholangiogram.   SURGEON:  Timothy E. Earlene Plater, M.D.   ASSISTANT:  Wilmon Arms. Tsuei, M.D.   ANESTHESIA:  General.   Ms. Leiter is 26, has a few controlled medical conditions, past history  of stroke, but lives independently with her husband and manages very  well.  She is minimally disabled from the stroke in the 1980s.  She has  had abdominal pain following meals, some nausea.  Gallstones were  diagnosed in October and after wintertime calls and bronchitis, she is  now ready to proceed with surgery.  She has been seen and evaluated by  Anesthesia and myself.  She appears well.  Laboratory data, chest x-ray  and cardiogram are satisfactory.  She is seen, identified, and the  permit signed.   She is taken to the operating room, placed supine, general endotracheal  anesthesia administered.  The abdomen is prepped and draped.  Marcaine  0.25% with epinephrine is used prior to each incision.  A vertical  infraumbilical incision made, fascia identified, peritoneum entered  without complications.  Hasson catheter placed there, tied in place with  a #1 Vicryl, and the abdomen insufflated.  General peritoneoscopy was  unremarkable except for adhesions to the lower midline and a thickened  gallbladder.  A second 10 mm trocar placed in the midepigastrium, two 5  mm trocars in the right upper quadrant.  Adhesions around the umbilical  port were bluntly taken down and then the gallbladder was  grasped,  placed on tension.  Careful dissection at the base of the gallbladder  revealed the cystic duct.  This was completely dissected out with a full  posterior window.  Above that was the cystic artery.  The cystic duct  was clipped near the gallbladder, opened and a percutaneously-placed  catheter was placed into the cystic duct, a clip applied.  Real-time  cholangiography carried out showed rapid filling of the biliary tree  without abnormality.  The clip and catheter removed.  The stump of the  cystic duct triply clipped and divided.  Cystic artery dissected out,  triply clipped and divided, and the gallbladder removed from the  gallbladder bed without complication, though there was considerable  scarring and thickness in the bed of the gallbladder.  It was placed and  an EndoCatch bag.  The gallbladder bed was carefully visualized, cautery  was used where necessary, and it was dry and the irrigation was clear.  The gallbladder was grasped and brought out through the infraumbilical  incision and that incision tied.  Careful inspection from the superior  port revealed no evidence of complications or problems.  Irrigation  carried out until clear and then all irrigant, CO2,  instruments and trocars removed under direct vision.  Skin incisions  closed with Monocryl and Steri-Strips.  Final counts correct.  She  tolerated it well, was removed to the recovery room in good condition.      Timothy E. Earlene Plater, M.D.  Electronically Signed     TED/MEDQ  D:  07/31/2006  T:  07/31/2006  Job:  [id number]

## 2010-12-07 NOTE — Op Note (Signed)
NAMESELITA, [name] NO.:  [id number]   MEDICAL RECORD NO.:  [id number]          PATIENT TYPE:  AMB   LOCATION:  DSC                          FACILITY:  [facility]   PHYSICIAN:  Loreta Ave, M.D. DATE OF BIRTH:  [birth year]   DATE OF PROCEDURE:  11/20/2005  DATE OF DISCHARGE:                                 OPERATIVE REPORT   INDICATIONS FOR PROCEDURE:  Longstanding subacromial impingement, partial  thickness tearing rotator cuff, distal clavicle osteolysis.  Failed  conservative treatment.  Brought to the operating room at this time for  assessment, decompression and cuff repair of a full thickness tear if found.   PREOPERATIVE DIAGNOSES:  Left shoulder impingement, partial thickness tear  and rotator cuff, marked osteolysis, acromioclavicular joint.   POSTOPERATIVE DIAGNOSES:  Left shoulder impingement, partial thickness tear  and rotator cuff, marked osteolysis, acromioclavicular joint with also  anterior-superior and posterior-superior labral tears.   OPERATION PERFORMED:  Left shoulder examination under anesthesia,  arthroscopy, debridement of rotator cuff and labrum, acromioplasty with CA  ligament release.  Bursectomy.  Excision of distal clavicle.   SURGEON:  Loreta Ave, M.D.   ASSISTANT:  Genene Churn. Barry Dienes, P.A.  present throughout the case.   SPECIMENS:  None.   ANESTHESIA:   CULTURES:  None.   COMPLICATIONS:  None.   DRESSING:  Soft compressive with sling.   DESCRIPTION OF PROCEDURE:  The patient was brought to the operating room and  after adequate anesthesia had been obtained, placed in a beach chair  position on the shoulder positioner, prepped and draped in the usual sterile  fashion.  Three portals, anterior, posterior lateral.  Shoulder entered with  blunt obturator, arthroscope introduced, shoulder inspected, articular  cartilage, capsular ligamentous structures intact.  Fraying of the labrum,  in the front and back debrided.   Some changes in the tendon, but  structurally intact, long head biceps tendon.  Undersurface rotator cuff  looked good.  Cannula redirected subacromially.  Type 3 acromion.  Roughening and thinning of the supraspinatus, but no full thickness tears.  Bursa resected and cuff debrided, acromioplasty to a type 1 acromion with  shaver and high speed bur releasing the CA ligament with cautery. Distal  clavicle grade 4 changes, marked periarticular spurs contributing to  impingement.  Periarticular spurs and the lateral 1 cm of clavicle resected.  Adequacy of decompression and clavicle excision confirmed viewing from all  portals.  Instruments and fluid removed.  Portals of shoulder injected with  Marcaine.  Portals closed with 4-0 nylon.  Sterile compressive dressing  applied.  Anesthesia reversed.  Brought to recovery room.  Tolerated surgery  well without complication.      Loreta Ave, M.D.  Electronically Signed     DFM/MEDQ  D:  11/20/2005  T:  11/21/2005  Job:  [id number]

## 2011-02-05 ENCOUNTER — Encounter: Payer: Self-pay | Admitting: Gastroenterology

## 2011-04-07 ENCOUNTER — Inpatient Hospital Stay (INDEPENDENT_AMBULATORY_CARE_PROVIDER_SITE_OTHER)
Admission: RE | Admit: 2011-04-07 | Discharge: 2011-04-07 | Disposition: A | Discharge status: Home / Self Care | Payer: Medicare Other | Source: Ambulatory Visit | Attending: Emergency Medicine | Admitting: Emergency Medicine

## 2011-04-07 DIAGNOSIS — H7090 Unspecified mastoiditis, unspecified ear: Secondary | ICD-10-CM

## 2011-04-16 ENCOUNTER — Ambulatory Visit: Payer: Medicare Other | Admitting: Internal Medicine

## 2011-04-22 ENCOUNTER — Ambulatory Visit: Payer: Medicare Other | Admitting: Internal Medicine

## 2011-04-22 LAB — BASIC METABOLIC PANEL
BUN: 17
CO2: 25
Chloride: 108
Creatinine, Ser: 0.75

## 2011-04-22 LAB — CBC
HCT: 39.8
MCHC: 34.5
MCV: 98.6
Platelets: 257

## 2011-04-22 LAB — DIFFERENTIAL
Basophils Relative: 1
Eosinophils Absolute: 0.2
Eosinophils Relative: 2
Monocytes Relative: 8
Neutrophils Relative %: 35 — ABNORMAL LOW

## 2011-04-22 LAB — TYPE AND SCREEN
ABO/RH(D): O POS
Antibody Screen: NEGATIVE

## 2011-05-23 ENCOUNTER — Ambulatory Visit (INDEPENDENT_AMBULATORY_CARE_PROVIDER_SITE_OTHER)
Admission: RE | Admit: 2011-05-23 | Discharge: 2011-05-23 | Disposition: A | Discharge status: Home / Self Care | Payer: Medicare Other | Source: Ambulatory Visit | Attending: Internal Medicine | Admitting: Internal Medicine

## 2011-05-23 ENCOUNTER — Ambulatory Visit (INDEPENDENT_AMBULATORY_CARE_PROVIDER_SITE_OTHER): Payer: Medicare Other | Admitting: Internal Medicine

## 2011-05-23 ENCOUNTER — Encounter: Payer: Self-pay | Admitting: Internal Medicine

## 2011-05-23 VITALS — BP 130/84 | HR 87 | Ht 64.0 in | Wt 169.7 lb

## 2011-05-23 DIAGNOSIS — J449 Chronic obstructive pulmonary disease, unspecified: Secondary | ICD-10-CM

## 2011-05-23 DIAGNOSIS — J309 Allergic rhinitis, unspecified: Secondary | ICD-10-CM

## 2011-05-23 DIAGNOSIS — J4489 Other specified chronic obstructive pulmonary disease: Secondary | ICD-10-CM

## 2011-05-23 DIAGNOSIS — F17200 Nicotine dependence, unspecified, uncomplicated: Secondary | ICD-10-CM

## 2011-05-23 MED ORDER — FLUTICASONE PROPIONATE 50 MCG/ACT NA SUSP: 2.0000 | Freq: Every day | NASAL | Status: DC

## 2011-05-23 NOTE — Patient Instructions (Signed)
Order- CXR- dx COPD  Script flonase nasal steroid spray- sent to drug store. This will need time to build up.

## 2011-05-23 NOTE — Progress Notes (Signed)
Patient ID: [name], female    DOB: [birth year], 70 y.o.   MRN: [id number]  HPI 32 yoF 1/2 PPD smoker returning for f/u of COPD and allergic rhinitis. PFT 2011 showed mild obstruction w/ severe reduction DLCO. Wears elastic sleeve right arm since remote mastectomy. Lat here September 23,2011. She and her husband shared a chest cold 1 month ago, resolved back to baseline w/o presciption med. She admits frequent wheeze. Gets short of breath on long walks, housework.  Uses Albuterol about 3 x/ daily, but hasn't used Advair saying "I didn't know what it was for". Little change day to day or since last here.  Having Spring seasonal nasal congestion and blowing for the past month. Eyes itch. Tried benadryl, but made her too sleepy.  Denies chest pains, blood or nodes. Occasional palpitation.  She would like to stop smoking, but hasn't tried even patches.   05/23/11- 69 yoF 1/2 PPD smoker returning for f/u of COPD and allergic rhinitis, complicated by history of CVA, breast cancer. She is trying to quit smoking. Now down to 2 or 3 cigarettes a day using an electronic cigarette instead and tried to taper the strength of that. She thinks her breathing is better. Only occasional albuterol, less than once per week. Had flu and pneumococcal vaccines and a TB skin test negative, this year. Right-sided chest and nose both seem the most congested. Occasionally, cough or blow out some phlegm. She denies blood or chest pain. CT chest 12/14/2009 showed changes of COPD, cardiac enlargement, no PE.   Review of Systems See HPI Constitutional:   No-   weight loss, night sweats, fevers, chills, fatigue, lassitude. HEENT:   No-  headaches, difficulty swallowing, tooth/dental problems, sore throat,       No-  sneezing, itching, ear ache, +nasal congestion, post nasal drip,  CV:  No-   chest pain, orthopnea, PND, swelling in lower extremities, anasarca, dizziness, palpitations Resp: No- acute  shortness of breath  with exertion or at rest.              No-   productive cough,  No non-productive cough,  No- coughing up of blood.              No-   change in color of mucus.  No- wheezing.   Skin: No-   rash or lesions. GI:  No-   heartburn, indigestion, abdominal pain, nausea, vomiting, diarrhea,                 change in bowel habits, loss of appetite GU: No-   dysuria, change in color of urine, no urgency or frequency.  No- flank pain. MS:  No-   joint pain or swelling.  No- decreased range of motion.  No- back pain. Neuro-     nothing unusual Psych:  No- change in mood or affect. No depression or anxiety.  No memory loss.    Objective:   Physical Exam  General- Alert, Oriented, Affect-appropriate, Distress- none acute Skin- rash-none, lesions- none, excoriation- none Lymphadenopathy- none Head- atraumatic            Eyes- Gross vision intact, PERRLA, conjunctivae clear secretions            Ears- Hearing, canals-normal            Nose- Clear, no-Septal dev, mucus, polyps, erosion, perforation             Throat- Mallampati II , mucosa clear , drainage-  none, tonsils- atrophic Neck- flexible , trachea midline, no stridor , thyroid nl, carotid no bruit Chest - symmetrical excursion , unlabored           Heart/CV- RRR , no murmur , no gallop  , no rub, nl s1 s2                           - JVD- none , edema- none, stasis changes- none, varices- none           Lung- diminished breath sounds, wheeze with laughter, unlabored., cough- none , dullness-none, rub- none           Chest wall-  Abd- tender-no, distended-no, bowel sounds-present, HSM- no Br/ Gen/ Rectal- Not done, not indicated Extrem- cyanosis- none, clubbing, none, atrophy- none, strength- nl. Right arm remains in the elastic sleeve. Neuro- grossly intact to observation

## 2011-05-24 NOTE — Assessment & Plan Note (Signed)
She feels better she cuts down on her cigarettes and does not have acute symptoms now. She is comfortable with medications.

## 2011-05-24 NOTE — Assessment & Plan Note (Signed)
I encouraged her efforts to quit smoking. She is using the electronic cigarette as intended, tapering the strength of the nicotine. Hopefully she will be able to get off completely.

## 2011-05-24 NOTE — Assessment & Plan Note (Signed)
Noticing some nasal congestion which may be seasonal. I don't think she has a sinus infection. We discussed Flonase, saline, decongestants.

## 2011-05-27 ENCOUNTER — Other Ambulatory Visit (HOSPITAL_COMMUNITY): Payer: Self-pay | Admitting: Internal Medicine

## 2011-05-27 DIAGNOSIS — Z1231 Encounter for screening mammogram for malignant neoplasm of breast: Secondary | ICD-10-CM

## 2011-05-29 NOTE — Progress Notes (Signed)
Quick Note:  Pt aware of results. ______ 

## 2011-06-04 ENCOUNTER — Other Ambulatory Visit (HOSPITAL_COMMUNITY): Payer: Self-pay | Admitting: Internal Medicine

## 2011-06-04 ENCOUNTER — Ambulatory Visit (HOSPITAL_COMMUNITY)
Admission: RE | Admit: 2011-06-04 | Discharge: 2011-06-04 | Disposition: A | Discharge status: Home / Self Care | Payer: Medicare Other | Source: Ambulatory Visit | Attending: Internal Medicine | Admitting: Internal Medicine

## 2011-06-04 DIAGNOSIS — M545 Low back pain: Secondary | ICD-10-CM

## 2011-06-04 DIAGNOSIS — M25559 Pain in unspecified hip: Secondary | ICD-10-CM

## 2011-06-04 DIAGNOSIS — M47817 Spondylosis without myelopathy or radiculopathy, lumbosacral region: Secondary | ICD-10-CM | POA: Insufficient documentation

## 2011-06-21 ENCOUNTER — Ambulatory Visit (HOSPITAL_COMMUNITY): Payer: Medicare Other

## 2011-06-26 ENCOUNTER — Ambulatory Visit (HOSPITAL_COMMUNITY)
Admission: RE | Admit: 2011-06-26 | Discharge: 2011-06-26 | Disposition: A | Discharge status: Home / Self Care | Payer: Medicare Other | Source: Ambulatory Visit | Attending: Internal Medicine | Admitting: Internal Medicine

## 2011-06-26 ENCOUNTER — Other Ambulatory Visit (HOSPITAL_COMMUNITY): Payer: Self-pay | Admitting: Internal Medicine

## 2011-06-26 DIAGNOSIS — Z13820 Encounter for screening for osteoporosis: Secondary | ICD-10-CM | POA: Insufficient documentation

## 2011-06-26 DIAGNOSIS — Z1231 Encounter for screening mammogram for malignant neoplasm of breast: Secondary | ICD-10-CM

## 2011-06-26 DIAGNOSIS — Z780 Asymptomatic menopausal state: Secondary | ICD-10-CM | POA: Insufficient documentation

## 2011-07-04 ENCOUNTER — Other Ambulatory Visit: Payer: Self-pay | Admitting: Neurosurgery

## 2011-07-04 DIAGNOSIS — M545 Low back pain: Secondary | ICD-10-CM

## 2011-07-04 DIAGNOSIS — M79604 Pain in right leg: Secondary | ICD-10-CM

## 2011-07-07 ENCOUNTER — Other Ambulatory Visit: Payer: Medicare Other

## 2011-07-08 ENCOUNTER — Ambulatory Visit
Admission: RE | Admit: 2011-07-08 | Discharge: 2011-07-08 | Disposition: A | Discharge status: Home / Self Care | Payer: Medicare Other | Source: Ambulatory Visit | Attending: Neurosurgery | Admitting: Neurosurgery

## 2011-07-08 DIAGNOSIS — M79604 Pain in right leg: Secondary | ICD-10-CM

## 2011-07-08 DIAGNOSIS — M545 Low back pain: Secondary | ICD-10-CM

## 2011-07-09 ENCOUNTER — Other Ambulatory Visit: Payer: Medicare Other

## 2011-11-21 ENCOUNTER — Ambulatory Visit: Payer: Medicare Other | Admitting: Internal Medicine

## 2011-12-30 ENCOUNTER — Ambulatory Visit (INDEPENDENT_AMBULATORY_CARE_PROVIDER_SITE_OTHER): Payer: Medicare Other | Admitting: Internal Medicine

## 2011-12-30 ENCOUNTER — Encounter: Payer: Self-pay | Admitting: Internal Medicine

## 2011-12-30 VITALS — BP 118/68 | HR 66 | Ht 64.0 in | Wt 170.2 lb

## 2011-12-30 DIAGNOSIS — J449 Chronic obstructive pulmonary disease, unspecified: Secondary | ICD-10-CM

## 2011-12-30 DIAGNOSIS — F17200 Nicotine dependence, unspecified, uncomplicated: Secondary | ICD-10-CM

## 2011-12-30 DIAGNOSIS — J309 Allergic rhinitis, unspecified: Secondary | ICD-10-CM

## 2011-12-30 MED ORDER — ALBUTEROL SULFATE HFA 108 (90 BASE) MCG/ACT IN AERS: 2.0000 | INHALATION_SPRAY | Freq: Four times a day (QID) | RESPIRATORY_TRACT | Status: DC | PRN

## 2011-12-30 NOTE — Patient Instructions (Addendum)
Please keep trying to stop smoking  Try otc mucinex-dm   To help thin the mucus and help your cough. Take it with extra water so you stay well hydrated.  Script sent to refill your albuterol rescue inhaler

## 2011-12-30 NOTE — Progress Notes (Signed)
Patient ID: [name], female    DOB: 08-31-1940, 71 y.o.   MRN: [id number]  HPI 71 yoF 1/2 PPD smoker returning for f/u of COPD and allergic rhinitis. PFT 2011 showed mild obstruction w/ severe reduction DLCO. Wears elastic sleeve right arm since remote mastectomy. Lat here September 23,2011. She and her husband shared a chest cold 1 month ago, resolved back to baseline w/o presciption med. She admits frequent wheeze. Gets short of breath on long walks, housework.  Uses Albuterol about 3 x/ daily, but hasn't used Advair saying "I didn't know what it was for". Little change day to day or since last here.  Having Spring seasonal nasal congestion and blowing for the past month. Eyes itch. Tried benadryl, but made her too sleepy.  Denies chest pains, blood or nodes. Occasional palpitation.  She would like to stop smoking, but hasn't tried even patches.   05/23/11- 71 yoF 1/2 PPD smoker returning for f/u of COPD and allergic rhinitis, complicated by history of CVA, breast cancer. She is trying to quit smoking. Now down to 2 or 3 cigarettes a day using an electronic cigarette instead and tried to taper the strength of that. She thinks her breathing is better. Only occasional albuterol, less than once per week. Had flu and pneumococcal vaccines and a TB skin test negative, this year. Right-sided chest and nose both seem the most congested. Occasionally, cough or blow out some phlegm. She denies blood or chest pain. CT chest 12/14/2009 showed changes of COPD, cardiac enlargement, no PE.  12/30/11- 71 yoF 1/2 PPD smoker returning for f/u of COPD and allergic rhinitis, complicated by history of CVA, breast cancer C/O cough with white mucus and wheezing since 5/29. Denies sob, chest pain, and chest tightness. Still smoking 5 or 6 cigarettes a day despite repeated counseling. After the winter comfortably. Blames the spring allergy for some nasal congestion. Saw her primary physician in May for bronchitis.  Doxycycline caused diarrhea. She completed prednisone taper. Now still has productive cough with white phlegm. Denies fever or adenopathy.  CXR 05/29/11- IMPRESSION:  Stable chest x-ray. No active lung disease.  Original Report Authenticated By: Juline Patch, M.D.   Review of Systems-See HPI Constitutional:   No-   weight loss, night sweats, fevers, chills, fatigue, lassitude. HEENT:   No-  headaches, difficulty swallowing, tooth/dental problems, sore throat,       No-  sneezing, itching, ear ache, +nasal congestion, post nasal drip,  CV:  No-   chest pain, orthopnea, PND, swelling in lower extremities, anasarca, dizziness, palpitations Resp: No- acute  shortness of breath with exertion or at rest.              + productive cough,  No non-productive cough,  No- coughing up of blood.              No-   change in color of mucus.  No- wheezing.   Skin: No-   rash or lesions. GI:  No-   heartburn, indigestion, abdominal pain, nausea, vomiting, GU: . MS:  No-   joint pain or swelling.   Neuro-     nothing unusual Psych:  No- change in mood or affect. No depression or anxiety.  No memory loss.  Objective:   Physical Exam  General- Alert, Oriented, Affect-appropriate, Distress- none acute Skin- rash-none, lesions- none, excoriation- none Lymphadenopathy- none Head- atraumatic            Eyes- Gross vision intact, PERRLA,  conjunctivae clear secretions            Ears- Hearing, canals-normal            Nose- Clear, no-Septal dev, mucus, polyps, erosion, perforation             Throat- Mallampati II , mucosa clear , drainage- none, tonsils- atrophic, dentures Neck- flexible , trachea midline, no stridor , thyroid nl, carotid no bruit Chest - symmetrical excursion , unlabored           Heart/CV- RRR , no murmur , no gallop  , no rub, nl s1 s2                           - JVD- none , edema- none, stasis changes- none, varices- none           Lung- diminished breath sounds, wheeze -none,  unlabored, Loose cough , dullness-none, rub- none           Chest wall-  Abd-  Br/ Gen/ Rectal- Not done, not indicated Extrem- cyanosis- none, clubbing, none, atrophy- none, strength- nl. Right arm remains in the elastic sleeve. Neuro- grossly intact to observation

## 2012-01-04 NOTE — Assessment & Plan Note (Signed)
Seasonal nasal congestion probably reflected some pollen allergy, aggravated by her smoking as discussed.

## 2012-01-04 NOTE — Assessment & Plan Note (Signed)
I again discussed her health history and compared it to the risks associated with her smoking. She is not trying to quit.

## 2012-01-04 NOTE — Assessment & Plan Note (Signed)
Chronic bronchitis, slowly clearing after exacerbation in late May 2013. No evident infection now, sputum is white. We discussed her medications and we'll refill albuterol. Recommend Mucinex DM.

## 2012-01-10 ENCOUNTER — Telehealth: Payer: Self-pay | Admitting: Internal Medicine

## 2012-01-10 NOTE — Telephone Encounter (Signed)
Received PA for Ventolin HFA- called [phone] to initiate PA.  Answered questions over the phone and will await approval/denial. Will forward to [facility].

## 2012-01-13 NOTE — Telephone Encounter (Signed)
PA has been denied; placed in CY's look at on cart for him to advise.

## 2012-01-14 NOTE — Telephone Encounter (Signed)
LMTCB

## 2012-01-14 NOTE — Telephone Encounter (Signed)
Her insurance won't cover Ventolin HFA rescue inhaler. There is not a medical reason to choose one over another. Recommend she use the brand her insurance covers. No prior auth.

## 2012-01-16 MED ORDER — ALBUTEROL SULFATE HFA 108 (90 BASE) MCG/ACT IN AERS: 2.0000 | INHALATION_SPRAY | Freq: Four times a day (QID) | RESPIRATORY_TRACT | Status: DC | PRN

## 2012-01-16 NOTE — Telephone Encounter (Signed)
Spoke with pharmacy at [facility] and was advised that proair is covered. I called the pt to inform her of this and she states ok to send rx Rx was sent and nothing further needed.

## 2012-05-19 ENCOUNTER — Other Ambulatory Visit (HOSPITAL_COMMUNITY): Payer: Self-pay | Admitting: Internal Medicine

## 2012-05-19 ENCOUNTER — Ambulatory Visit (HOSPITAL_COMMUNITY)
Admission: RE | Admit: 2012-05-19 | Discharge: 2012-05-19 | Disposition: A | Discharge status: Home / Self Care | Payer: Medicare Other | Source: Ambulatory Visit | Attending: Internal Medicine | Admitting: Internal Medicine

## 2012-05-19 DIAGNOSIS — I10 Essential (primary) hypertension: Secondary | ICD-10-CM

## 2012-05-19 DIAGNOSIS — F17200 Nicotine dependence, unspecified, uncomplicated: Secondary | ICD-10-CM | POA: Insufficient documentation

## 2012-06-30 ENCOUNTER — Ambulatory Visit: Payer: Medicare Other | Admitting: Internal Medicine

## 2012-07-22 DIAGNOSIS — I700 Atherosclerosis of aorta: Secondary | ICD-10-CM

## 2012-07-22 HISTORY — DX: Atherosclerosis of aorta: I70.0

## 2012-08-03 ENCOUNTER — Encounter: Payer: Self-pay | Admitting: Internal Medicine

## 2012-08-03 ENCOUNTER — Ambulatory Visit (INDEPENDENT_AMBULATORY_CARE_PROVIDER_SITE_OTHER): Payer: Medicare Other | Admitting: Internal Medicine

## 2012-08-03 VITALS — BP 120/76 | HR 67 | Ht 64.0 in | Wt 169.6 lb

## 2012-08-03 DIAGNOSIS — J42 Unspecified chronic bronchitis: Secondary | ICD-10-CM

## 2012-08-03 DIAGNOSIS — J449 Chronic obstructive pulmonary disease, unspecified: Secondary | ICD-10-CM

## 2012-08-03 DIAGNOSIS — F17200 Nicotine dependence, unspecified, uncomplicated: Secondary | ICD-10-CM

## 2012-08-03 MED ORDER — METHYLPREDNISOLONE ACETATE 80 MG/ML IJ SUSP
80.0000 mg | Freq: Once | INTRAMUSCULAR | Status: AC
  Administered 2012-08-03: 80 mg via INTRAMUSCULAR

## 2012-08-03 MED ORDER — LEVALBUTEROL HCL 0.63 MG/3ML IN NEBU
0.6300 mg | INHALATION_SOLUTION | Freq: Once | RESPIRATORY_TRACT | Status: AC
  Administered 2012-08-03: 0.63 mg via RESPIRATORY_TRACT

## 2012-08-03 MED ORDER — ALBUTEROL SULFATE HFA 108 (90 BASE) MCG/ACT IN AERS: 2.0000 | INHALATION_SPRAY | Freq: Four times a day (QID) | RESPIRATORY_TRACT | Status: DC | PRN

## 2012-08-03 NOTE — Patient Instructions (Addendum)
Script to refill albuterol HFA rescue inhaler  Ok to continue [employer] 0.63    Dx COPD acute exacerbation  Depo 80  Information given on [facility] Smoking Cessation program

## 2012-08-03 NOTE — Progress Notes (Signed)
Patient ID: [name], female    DOB: 1940/11/30, 72 y.o.   MRN: [id number]  HPI 40 yoF 1/2 PPD smoker returning for f/u of COPD and allergic rhinitis. PFT 2011 showed mild obstruction w/ severe reduction DLCO. Wears elastic sleeve right arm since remote mastectomy. Lat here September 23,2011. She and her husband shared a chest cold 1 month ago, resolved back to baseline w/o presciption med. She admits frequent wheeze. Gets short of breath on long walks, housework.  Uses Albuterol about 3 x/ daily, but hasn't used Advair saying "I didn't know what it was for". Little change day to day or since last here.  Having Spring seasonal nasal congestion and blowing for the past month. Eyes itch. Tried benadryl, but made her too sleepy.  Denies chest pains, blood or nodes. Occasional palpitation.  She would like to stop smoking, but hasn't tried even patches.   05/23/11- 72 yoF 1/2 PPD smoker returning for f/u of COPD and allergic rhinitis, complicated by history of CVA, breast cancer. She is trying to quit smoking. Now down to 2 or 3 cigarettes a day using an electronic cigarette instead and tried to taper the strength of that. She thinks her breathing is better. Only occasional albuterol, less than once per week. Had flu and pneumococcal vaccines and a TB skin test negative, this year. Right-sided chest and nose both seem the most congested. Occasionally, cough or blow out some phlegm. She denies blood or chest pain. CT chest 12/14/2009 showed changes of COPD, cardiac enlargement, no PE.  12/30/11- 72 yoF 1/2 PPD smoker returning for f/u of COPD and allergic rhinitis, complicated by history of CVA, breast cancer C/O cough with white mucus and wheezing since 5/29. Denies sob, chest pain, and chest tightness. Still smoking 5 or 6 cigarettes a day despite repeated counseling. After the winter comfortably. Blames the spring allergy for some nasal congestion. Saw her primary physician in May for bronchitis.  Doxycycline caused diarrhea. She completed prednisone taper. Now still has productive cough with white phlegm. Denies fever or adenopathy.  CXR 05/29/11- IMPRESSION:  Stable chest x-ray. No active lung disease.  Original Report Authenticated By: Juline Patch, M.D.    08/03/12- 72 yoF 1/2 PPD smoker returning for f/u of COPD and allergic rhinitis, complicated by history of CVA, breast cancer/ lymphedema FOLLOWS FOR: had been doing good with breathing until today; started coughing-productive-yellow in color; SOB and wheezing today as well. No effort to stop smoking despite her discussions. Reports doing very well until the last day or so-new onset of postnasal drip, cough, scant yellow sputum without fever swollen glands or sore throat. Using her Spiriva about 3 times per week and lost her rescue inhaler. CXR 05/19/12 IMPRESSION:  Stable chest x-ray. No active lung disease. Prior right  mastectomy.  Original Report Authenticated By: Juline Patch, M.D.   Review of Systems-See HPI Constitutional:   No-   weight loss, night sweats, fevers, chills, fatigue, lassitude. HEENT:   No-  headaches, difficulty swallowing, tooth/dental problems, sore throat,       No-  sneezing, itching, ear ache, +nasal congestion, +post nasal drip,  CV:  No-   chest pain, orthopnea, PND, swelling in lower extremities, anasarca, dizziness, palpitations Resp: No- acute  shortness of breath with exertion or at rest.              + productive cough,  No non-productive cough,  No- coughing up of blood.  No-   change in color of mucus.  No- wheezing.   Skin: No-   rash or lesions. GI:  No-   heartburn, indigestion, abdominal pain, nausea, vomiting, GU: . MS:  No-   joint pain or swelling.   Neuro-     nothing unusual Psych:  No- change in mood or affect. No depression or anxiety.  No memory loss.  Objective:   Physical Exam  General- Alert, Oriented, Affect-appropriate, Distress- none acute Skin-  rash-none, lesions- none, excoriation- none Lymphadenopathy- none Head- atraumatic            Eyes- Gross vision intact, PERRLA, conjunctivae clear secretions            Ears- Hearing, canals-normal            Nose- Clear, no-Septal dev, mucus, polyps, erosion, perforation             Throat- Mallampati II , mucosa clear , drainage- none, tonsils- atrophic, dentures Neck- flexible , trachea midline, no stridor , thyroid nl, carotid no bruit Chest - symmetrical excursion , unlabored           Heart/CV- RRR , no murmur , no gallop  , no rub, nl s1 s2                           - JVD- none , edema- none, stasis changes- none, varices- none           Lung- diminished breath sounds, wheeze -none, unlabored, +Loose cough , dullness-none, rub- none           Chest wall-  Abd-  Br/ Gen/ Rectal- Not done, not indicated Extrem- cyanosis- none, clubbing, none, atrophy- none, strength- nl. Right arm remains in the elastic sleeve. Neuro- grossly intact to observation

## 2012-08-15 NOTE — Assessment & Plan Note (Signed)
She remains on moved by my repeated efforts to steer her towards an effort at smoking cessation including available support systems and techniques.

## 2012-08-15 NOTE — Assessment & Plan Note (Signed)
Acute upper respiratory infection potentially becoming an acute exacerbation of her COPD Plan-nebulizer treatment, Depo-Medrol, fluids. Refill her rescue inhaler.

## 2013-02-02 ENCOUNTER — Ambulatory Visit: Payer: Medicare Other | Admitting: Internal Medicine

## 2013-02-08 ENCOUNTER — Ambulatory Visit: Payer: Medicare Other | Admitting: Internal Medicine

## 2013-03-30 ENCOUNTER — Ambulatory Visit: Payer: Self-pay | Admitting: Internal Medicine

## 2013-04-06 ENCOUNTER — Encounter: Payer: Self-pay | Admitting: Internal Medicine

## 2013-04-06 ENCOUNTER — Ambulatory Visit (INDEPENDENT_AMBULATORY_CARE_PROVIDER_SITE_OTHER): Payer: Medicare Other | Admitting: Internal Medicine

## 2013-04-06 ENCOUNTER — Ambulatory Visit (INDEPENDENT_AMBULATORY_CARE_PROVIDER_SITE_OTHER)
Admission: RE | Admit: 2013-04-06 | Discharge: 2013-04-06 | Disposition: A | Discharge status: Home / Self Care | Payer: Medicare Other | Source: Ambulatory Visit | Attending: Internal Medicine | Admitting: Internal Medicine

## 2013-04-06 VITALS — BP 126/60 | HR 73 | Resp 18 | Ht 64.0 in | Wt 169.3 lb

## 2013-04-06 DIAGNOSIS — F17200 Nicotine dependence, unspecified, uncomplicated: Secondary | ICD-10-CM

## 2013-04-06 DIAGNOSIS — J449 Chronic obstructive pulmonary disease, unspecified: Secondary | ICD-10-CM

## 2013-04-06 DIAGNOSIS — J302 Other seasonal allergic rhinitis: Secondary | ICD-10-CM

## 2013-04-06 DIAGNOSIS — J309 Allergic rhinitis, unspecified: Secondary | ICD-10-CM

## 2013-04-06 DIAGNOSIS — J441 Chronic obstructive pulmonary disease with (acute) exacerbation: Secondary | ICD-10-CM

## 2013-04-06 DIAGNOSIS — Z23 Encounter for immunization: Secondary | ICD-10-CM

## 2013-04-06 MED ORDER — TIOTROPIUM BROMIDE MONOHYDRATE 18 MCG IN CAPS: 18.0000 ug | ORAL_CAPSULE | Freq: Every day | RESPIRATORY_TRACT | Status: DC

## 2013-04-06 MED ORDER — FLUTICASONE PROPIONATE 50 MCG/ACT NA SUSP: 2.0000 | Freq: Every day | NASAL | Status: DC

## 2013-04-06 NOTE — Patient Instructions (Addendum)
Refill scripts sent for flonase and spiriva  Flu vax  Order- CXR dx COPD  Please keep trying to stop smoking

## 2013-04-06 NOTE — Progress Notes (Signed)
Patient ID: [name], female    DOB: 08/11/1940, 72 y.o.   MRN: [id number]  HPI 19 yoF 1/2 PPD smoker returning for f/u of COPD and allergic rhinitis. PFT 2011 showed mild obstruction w/ severe reduction DLCO. Wears elastic sleeve right arm since remote mastectomy. Lat here September 23,2011. She and her husband shared a chest cold 1 month ago, resolved back to baseline w/o presciption med. She admits frequent wheeze. Gets short of breath on long walks, housework.  Uses Albuterol about 3 x/ daily, but hasn't used Advair saying "I didn't know what it was for". Little change day to day or since last here.  Having Spring seasonal nasal congestion and blowing for the past month. Eyes itch. Tried benadryl, but made her too sleepy.  Denies chest pains, blood or nodes. Occasional palpitation.  She would like to stop smoking, but hasn't tried even patches.   05/23/11- 72 yoF 1/2 PPD smoker returning for f/u of COPD and allergic rhinitis, complicated by history of CVA, breast cancer. She is trying to quit smoking. Now down to 2 or 3 cigarettes a day using an electronic cigarette instead and tried to taper the strength of that. She thinks her breathing is better. Only occasional albuterol, less than once per week. Had flu and pneumococcal vaccines and a TB skin test negative, this year. Right-sided chest and nose both seem the most congested. Occasionally, cough or blow out some phlegm. She denies blood or chest pain. CT chest 12/14/2009 showed changes of COPD, cardiac enlargement, no PE.  12/30/11- 72 yoF 1/2 PPD smoker returning for f/u of COPD and allergic rhinitis, complicated by history of CVA, breast cancer C/O cough with white mucus and wheezing since 5/29. Denies sob, chest pain, and chest tightness. Still smoking 5 or 6 cigarettes a day despite repeated counseling. After the winter comfortably. Blames the spring allergy for some nasal congestion. Saw her primary physician in May for bronchitis.  Doxycycline caused diarrhea. She completed prednisone taper. Now still has productive cough with white phlegm. Denies fever or adenopathy.  CXR 05/29/11- IMPRESSION:  Stable chest x-ray. No active lung disease.  Original Report Authenticated By: Juline Patch, M.D.    08/03/12- 72 yoF 1/2 PPD smoker returning for f/u of COPD and allergic rhinitis, complicated by history of CVA, breast cancer/ lymphedema FOLLOWS FOR: had been doing good with breathing until today; started coughing-productive-yellow in color; SOB and wheezing today as well. No effort to stop smoking despite her discussions. Reports doing very well until the last day or so-new onset of postnasal drip, cough, scant yellow sputum without fever swollen glands or sore throat. Using her Spiriva about 3 times per week and lost her rescue inhaler. CXR 05/19/12 IMPRESSION:  Stable chest x-ray. No active lung disease. Prior right  mastectomy.  Original Report Authenticated By: Juline Patch, M.D.   04/06/13- 72 yoF 1/2 PPD smoker returning for f/u of COPD and allergic rhinitis, complicated by history of CVA, breast cancer/ lymphedema FOLLOWS FOR:sob-same,cough-yellow,wheezing,denies cp and tightness No effort to stop smoking. Some sinus congestion. Rescue inhaler < 1x/ day.  Review of Systems-See HPI Constitutional:   No-   weight loss, night sweats, fevers, chills, fatigue, lassitude. HEENT:   No-  headaches, difficulty swallowing, tooth/dental problems, sore throat,       No-  sneezing, itching, ear ache, +nasal congestion, +post nasal drip,  CV:  No-   chest pain, orthopnea, PND, swelling in lower extremities, anasarca, dizziness, palpitations Resp: No-  acute  shortness of breath with exertion or at rest.              + productive cough,  No non-productive cough,  No- coughing up of blood.              + change in color of mucus.  No- wheezing.   Skin: No-   rash or lesions. GI:  No-   heartburn, indigestion, abdominal pain, nausea,  vomiting, GU: . MS:  No-   joint pain or swelling.   Neuro-     nothing unusual Psych:  No- change in mood or affect. No depression or anxiety.  No memory loss.  Objective:   Physical Exam  General- Alert, Oriented, Affect-appropriate, Distress- none acute Skin- rash-none, lesions- none, excoriation- none Lymphadenopathy- none Head- atraumatic            Eyes- Gross vision intact, PERRLA, conjunctivae clear secretions            Ears- Hearing, canals-normal            Nose- +turbinate edema, no-Septal dev, mucus, polyps, erosion, perforation             Throat- Mallampati II , mucosa clear , drainage- none, tonsils- atrophic, dentures Neck- flexible , trachea midline, no stridor , thyroid nl, carotid no bruit Chest - symmetrical excursion , unlabored           Heart/CV- RRR , no murmur , no gallop  , no rub, nl s1 s2                           - JVD- none , edema- none, stasis changes- none, varices- none           Lung- +diminished breath sounds, wheeze -none, unlabored, +Loose cough , dullness-none, rub- none           Chest wall-  Abd-  Br/ Gen/ Rectal- Not done, not indicated Extrem- cyanosis- none, clubbing, none, atrophy- none, strength- nl. +Right arm remains in the elastic sleeve. Neuro- grossly intact to observation

## 2013-04-07 NOTE — Progress Notes (Signed)
Quick Note:  Informed pt of CXR results and she verbalized understanding and has no further questions or concerns at this time ______ 

## 2013-04-11 NOTE — Assessment & Plan Note (Signed)
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 Plan-refill Flonase

## 2013-04-11 NOTE — Assessment & Plan Note (Signed)
Little clinical change. We discussed her medications. Plan-refill Spiriva, flu shot

## 2013-04-11 NOTE — Assessment & Plan Note (Signed)
I talked with her again today. She she is not prepared to make an effort to stop smoking

## 2013-05-22 ENCOUNTER — Encounter: Payer: Self-pay | Admitting: Internal Medicine

## 2013-05-22 DIAGNOSIS — I2510 Atherosclerotic heart disease of native coronary artery without angina pectoris: Secondary | ICD-10-CM | POA: Insufficient documentation

## 2013-05-22 DIAGNOSIS — M1990 Unspecified osteoarthritis, unspecified site: Secondary | ICD-10-CM | POA: Insufficient documentation

## 2013-05-31 ENCOUNTER — Encounter: Payer: Self-pay | Admitting: Internal Medicine

## 2013-05-31 ENCOUNTER — Ambulatory Visit: Payer: Self-pay | Admitting: Internal Medicine

## 2013-05-31 VITALS — BP 142/78 | HR 72 | Temp 98.1°F | Resp 16 | Ht 63.0 in | Wt 169.0 lb

## 2013-05-31 DIAGNOSIS — Z79899 Other long term (current) drug therapy: Secondary | ICD-10-CM

## 2013-05-31 DIAGNOSIS — E782 Mixed hyperlipidemia: Secondary | ICD-10-CM

## 2013-05-31 DIAGNOSIS — I10 Essential (primary) hypertension: Secondary | ICD-10-CM

## 2013-05-31 DIAGNOSIS — R7309 Other abnormal glucose: Secondary | ICD-10-CM

## 2013-05-31 DIAGNOSIS — Z0000 Encounter for general adult medical examination without abnormal findings: Secondary | ICD-10-CM

## 2013-05-31 DIAGNOSIS — Z1212 Encounter for screening for malignant neoplasm of rectum: Secondary | ICD-10-CM

## 2013-05-31 DIAGNOSIS — E559 Vitamin D deficiency, unspecified: Secondary | ICD-10-CM

## 2013-05-31 LAB — HEPATIC FUNCTION PANEL
ALT: 39 U/L — ABNORMAL HIGH (ref 0–35)
AST: 32 U/L (ref 0–37)
Alkaline Phosphatase: 71 U/L (ref 39–117)
Indirect Bilirubin: 0.3 mg/dL (ref 0.0–0.9)
Total Protein: 7.1 g/dL (ref 6.0–8.3)

## 2013-05-31 LAB — BASIC METABOLIC PANEL WITH GFR
BUN: 17 mg/dL (ref 6–23)
CO2: 24 mEq/L (ref 19–32)
Chloride: 104 mEq/L (ref 96–112)
GFR, Est African American: 78 mL/min
Glucose, Bld: 99 mg/dL (ref 70–99)
Potassium: 4.2 mEq/L (ref 3.5–5.3)

## 2013-05-31 LAB — CBC WITH DIFFERENTIAL/PLATELET
Basophils Relative: 0 % (ref 0–1)
HCT: 40.9 % (ref 36.0–46.0)
Hemoglobin: 14.5 g/dL (ref 12.0–15.0)
MCHC: 35.5 g/dL (ref 30.0–36.0)
Monocytes Absolute: 0.9 10*3/uL (ref 0.1–1.0)
Monocytes Relative: 8 % (ref 3–12)
Neutro Abs: 3.9 10*3/uL (ref 1.7–7.7)

## 2013-05-31 LAB — LIPID PANEL: Total CHOL/HDL Ratio: 4.5 Ratio

## 2013-05-31 LAB — TSH: TSH: 2.536 u[IU]/mL (ref 0.350–4.500)

## 2013-05-31 NOTE — Patient Instructions (Signed)
Continue diet and meds as discussed. Further disposition pending results of labs.  Recommend try Imodium/ Loperamide 2 to 4 tabs/ daily for diarrhea  Diabetes and Exercise Exercising regularly is important. It is not just about losing weight. It has many health benefits, such as:  Improving your overall fitness, flexibility, and endurance.  Increasing your bone density.  Helping with weight control.  Decreasing your body fat.  Increasing your muscle strength.  Reducing stress and tension.  Improving your overall health. People with diabetes who exercise gain additional benefits because exercise:  Reduces appetite.  Improves the body's use of blood sugar (glucose).  Helps lower or control blood glucose.  Decreases blood pressure.  Helps control blood lipids (such as cholesterol and triglycerides).  Improves the body's use of the hormone insulin by:  Increasing the body's insulin sensitivity.  Reducing the body's insulin needs.  Decreases the risk for heart disease because exercising:  Lowers cholesterol and triglycerides levels.  Increases the levels of good cholesterol (such as high-density lipoproteins [HDL]) in the body.  Lowers blood glucose levels. YOUR ACTIVITY PLAN  Choose an activity that you enjoy and set realistic goals. Your health care provider or diabetes educator can help you make an activity plan that works for you. You can break activities into 2 or 3 sessions throughout the day. Doing so is as good as one long session. Exercise ideas include:  Taking the dog for a walk.  Taking the stairs instead of the elevator.  Dancing to your favorite song.  Doing your favorite exercise with a friend. RECOMMENDATIONS FOR EXERCISING WITH TYPE 1 OR TYPE 2 DIABETES   Check your blood glucose before exercising. If blood glucose levels are greater than 240 mg/dL, check for urine ketones. Do not exercise if ketones are present.  Avoid injecting insulin into  areas of the body that are going to be exercised. For example, avoid injecting insulin into:  The arms when playing tennis.  The legs when jogging.  Keep a record of:  Food intake before and after you exercise.  Expected peak times of insulin action.  Blood glucose levels before and after you exercise.  The type and amount of exercise you have done.  Review your records with your health care provider. Your health care provider will help you to develop guidelines for adjusting food intake and insulin amounts before and after exercising.  If you take insulin or oral hypoglycemic agents, watch for signs and symptoms of hypoglycemia. They include:  Dizziness.  Shaking.  Sweating.  Chills.  Confusion.  Drink plenty of water while you exercise to prevent dehydration or heat stroke. Body water is lost during exercise and must be replaced.  Talk to your health care provider before starting an exercise program to make sure it is safe for you. Remember, almost any type of activity is better than none. Document Released: 09/28/2003 Document Revised: 03/10/2013 Document Reviewed: 12/15/2012 [facility] Patient Information 2014 [place], [place]. Hypertension As your heart beats, it forces blood through your arteries. This force is your blood pressure. If the pressure is too high, it is called hypertension (HTN) or high blood pressure. HTN is dangerous because you may have it and not know it. High blood pressure may mean that your heart has to work harder to pump blood. Your arteries may be narrow or stiff. The extra work puts you at risk for heart disease, stroke, and other problems.  Blood pressure consists of two numbers, a higher number over a lower, 110/72,  for example. It is stated as "110 over 72." The ideal is below 120 for the top number (systolic) and under 80 for the bottom (diastolic). Write down your blood pressure today. You should pay close attention to your blood pressure if you  have certain conditions such as:  Heart failure.  Prior heart attack.  Diabetes  Chronic kidney disease.  Prior stroke.  Multiple risk factors for heart disease. To see if you have HTN, your blood pressure should be measured while you are seated with your arm held at the level of the heart. It should be measured at least twice. A one-time elevated blood pressure reading (especially in the Emergency Department) does not mean that you need treatment. There may be conditions in which the blood pressure is different between your right and left arms. It is important to see your caregiver soon for a recheck. Most people have essential hypertension which means that there is not a specific cause. This type of high blood pressure may be lowered by changing lifestyle factors such as:  Stress.  Smoking.  Lack of exercise.  Excessive weight.  Drug/tobacco/alcohol use.  Eating less salt. Most people do not have symptoms from high blood pressure until it has caused damage to the body. Effective treatment can often prevent, delay or reduce that damage. TREATMENT  When a cause has been identified, treatment for high blood pressure is directed at the cause. There are a large number of medications to treat HTN. These fall into several categories, and your caregiver will help you select the medicines that are best for you. Medications may have side effects. You should review side effects with your caregiver. If your blood pressure stays high after you have made lifestyle changes or started on medicines,   Your medication(s) may need to be changed.  Other problems may need to be addressed.  Be certain you understand your prescriptions, and know how and when to take your medicine.  Be sure to follow up with your caregiver within the time frame advised (usually within two weeks) to have your blood pressure rechecked and to review your medications.  If you are taking more than one medicine to lower  your blood pressure, make sure you know how and at what times they should be taken. Taking two medicines at the same time can result in blood pressure that is too low. SEEK IMMEDIATE MEDICAL CARE IF:  You develop a severe headache, blurred or changing vision, or confusion.  You have unusual weakness or numbness, or a faint feeling.  You have severe chest or abdominal pain, vomiting, or breathing problems. MAKE SURE YOU:   Understand these instructions.  Will watch your condition.  Will get help right away if you are not doing well or get worse. Document Released: 07/08/2005 Document Revised: 09/30/2011 Document Reviewed: 02/26/2008 [facility] Patient Information 2014 [place], [place]. Diabetes and Exercise Exercising regularly is important. It is not just about losing weight. It has many health benefits, such as:  Improving your overall fitness, flexibility, and endurance.  Increasing your bone density.  Helping with weight control.  Decreasing your body fat.  Increasing your muscle strength.  Reducing stress and tension.  Improving your overall health. People with diabetes who exercise gain additional benefits because exercise:  Reduces appetite.  Improves the body's use of blood sugar (glucose).  Helps lower or control blood glucose.  Decreases blood pressure.  Helps control blood lipids (such as cholesterol and triglycerides).  Improves the body's use of the  hormone insulin by:  Increasing the body's insulin sensitivity.  Reducing the body's insulin needs.  Decreases the risk for heart disease because exercising:  Lowers cholesterol and triglycerides levels.  Increases the levels of good cholesterol (such as high-density lipoproteins [HDL]) in the body.  Lowers blood glucose levels. YOUR ACTIVITY PLAN  Choose an activity that you enjoy and set realistic goals. Your health care provider or diabetes educator can help you make an activity plan that works for  you. You can break activities into 2 or 3 sessions throughout the day. Doing so is as good as one long session. Exercise ideas include:  Taking the dog for a walk.  Taking the stairs instead of the elevator.  Dancing to your favorite song.  Doing your favorite exercise with a friend. RECOMMENDATIONS FOR EXERCISING WITH TYPE 1 OR TYPE 2 DIABETES   Check your blood glucose before exercising. If blood glucose levels are greater than 240 mg/dL, check for urine ketones. Do not exercise if ketones are present.  Avoid injecting insulin into areas of the body that are going to be exercised. For example, avoid injecting insulin into:  The arms when playing tennis.  The legs when jogging.  Keep a record of:  Food intake before and after you exercise.  Expected peak times of insulin action.  Blood glucose levels before and after you exercise.  The type and amount of exercise you have done.  Review your records with your health care provider. Your health care provider will help you to develop guidelines for adjusting food intake and insulin amounts before and after exercising.  If you take insulin or oral hypoglycemic agents, watch for signs and symptoms of hypoglycemia. They include:  Dizziness.  Shaking.  Sweating.  Chills.  Confusion.  Drink plenty of water while you exercise to prevent dehydration or heat stroke. Body water is lost during exercise and must be replaced.  Talk to your health care provider before starting an exercise program to make sure it is safe for you. Remember, almost any type of activity is better than none. Document Released: 09/28/2003 Document Revised: 03/10/2013 Document Reviewed: 12/15/2012 [facility] Patient Information 2014 [place], [place]. Cholesterol Cholesterol is a white, waxy, fat-like protein needed by your body in small amounts. The liver makes all the cholesterol you need. It is carried from the liver by the blood through the blood vessels.  Deposits (plaque) may build up on blood vessel walls. This makes the arteries narrower and stiffer. Plaque increases the risk for heart attack and stroke. You cannot feel your cholesterol level even if it is very high. The only way to know is by a blood test to check your lipid (fats) levels. Once you know your cholesterol levels, you should keep a record of the test results. Work with your caregiver to to keep your levels in the desired range. WHAT THE RESULTS MEAN:  Total cholesterol is a rough measure of all the cholesterol in your blood.  LDL is the so-called bad cholesterol. This is the type that deposits cholesterol in the walls of the arteries. You want this level to be low.  HDL is the good cholesterol because it cleans the arteries and carries the LDL away. You want this level to be high.  Triglycerides are fat that the body can either burn for energy or store. High levels are closely linked to heart disease. DESIRED LEVELS:  Total cholesterol below 200.  LDL below 100 for people at risk, below 70 for very high  risk.  HDL above 50 is good, above 60 is best.  Triglycerides below 150. HOW TO LOWER YOUR CHOLESTEROL:  Diet.  Choose fish or white meat chicken and [place], roasted or baked. Limit fatty cuts of red meat, fried foods, and processed meats, such as sausage and lunch meat.  Eat lots of fresh fruits and vegetables. Choose whole grains, beans, pasta, potatoes and cereals.  Use only small amounts of olive, corn or canola oils. Avoid butter, mayonnaise, shortening or palm kernel oils. Avoid foods with trans-fats.  Use skim/nonfat milk and low-fat/nonfat yogurt and cheeses. Avoid whole milk, cream, ice cream, egg yolks and cheeses. Healthy desserts include angel food cake, ginger snaps, animal crackers, hard candy, popsicles, and low-fat/nonfat frozen yogurt. Avoid pastries, cakes, pies and cookies.  Exercise.  A regular program helps decrease LDL and raises HDL.  Helps  with weight control.  Do things that increase your activity level like gardening, walking, or taking the stairs.  Medication.  May be prescribed by your caregiver to help lowering cholesterol and the risk for heart disease.  You may need medicine even if your levels are normal if you have several risk factors. HOME CARE INSTRUCTIONS   Follow your diet and exercise programs as suggested by your caregiver.  Take medications as directed.  Have blood work done when your caregiver feels it is necessary. MAKE SURE YOU:   Understand these instructions.  Will watch your condition.  Will get help right away if you are not doing well or get worse. Document Released: 04/02/2001 Document Revised: 09/30/2011 Document Reviewed: 09/23/2007 [facility] Patient Information 2014 [place], [place].

## 2013-05-31 NOTE — Progress Notes (Signed)
Patient ID: [name], female   DOB: Jan 09, 1941, 72 y.o.   MRN: [id number]   HPI This very nice 72 yo MWF presents for complete physical.  Patient' has HTN (1991) and BP has been controlled at home. Patient denies any cardiac Symptoms as chest pain, palpitations, shortness of breath, dizziness or ankle swelling. She does have significant hx/o ASCVD with Left Brain CVA in 1985. She has persistent mild right hemiparesis and limp persisting.  Patient's hyperlipidemia is controlled with diet and medications. Patient denies myalgias or other medication SE's. Last cholesterol last visit was 158 , triglycerides 243, and LDL 70.   Patient has prediabetes/insulin resistance with last A1c 6.8% / elevated Insulin  at 101 (nl<28) in July 2014. She was started on metformin and reports ongoing diarrhea even at low dose. Patient denies reactive hypoglycemic symptoms, visual blurring, diabetic polys, or paresthesias.   Other problems include Vitamin D Deficiency D=12 in 2008 & 50 in July) for which patient is supplementing Vitamin D.      Medication List                      albuterol 108 (90 BASE) MCG/ACT inhaler  Commonly known as:  VENTOLIN HFA  Inhale 2 puffs into the lungs every 6 (six) hours as needed for wheezing or shortness of breath.     aspirin 81 MG tablet  Take 81 mg by mouth daily.     ezetimibe 10 MG tablet  Commonly known as:  ZETIA  Take 10 mg by mouth daily.     fenofibrate micronized 134 MG capsule  Commonly known as:  LOFIBRA  Take 134 mg by mouth daily before breakfast.     fish oil-omega-3 fatty acids 1000 MG capsule  Take 1 capsule by mouth 3 (three) times daily.     Flax Seed Oil 1000 MG Caps  Take 1 capsule by mouth 3 (three) times daily.     fluticasone 50 MCG/ACT nasal spray  Commonly known as:  FLONASE  Place 2 sprays into the nose daily. At bedtime     furosemide 40 MG tablet  Commonly known as:  LASIX  Take 40 mg by mouth. Takes 1/2 tablet daily by  mouth     losartan 100 MG tablet  Commonly known as:  COZAAR  Take 100 mg by mouth daily.     magnesium oxide 400 MG tablet  Commonly known as:  MAG-OX  Take 400 mg by mouth 3 (three) times daily.     metFORMIN 500 MG (MOD) 24 hr tablet  Commonly known as:  GLUMETZA  Take 500 mg by mouth daily.     tiotropium 18 MCG inhalation capsule  Commonly known as:  SPIRIVA  Place 1 capsule (18 mcg total) into inhaler and inhale daily.     Vitamin D3 2000 UNITS Tabs  Take 3 tablets by mouth daily. 6000u per day        Health Maintenance  Topic Date Due  . Colonoscopy  04/24/1991  . Zostavax  04/23/2001  . Influenza Vaccine  02/19/2014  . Tetanus/tdap  07/22/2017  . Pneumococcal Polysaccharide Vaccine Age 30 And Over  Completed    Allergies  Allergen Reactions  . Ace Inhibitors   . Acyclovir   . Amoxil [Amoxicillin] Diarrhea  . Bisoprolol     Decreased Heart Rate  . Crestor [Rosuvastatin]     Elevated LFT's and Myalgias  . Darvocet [Propoxyphene-Acetaminophen]     Bad Dreams  .  Doxycycline Diarrhea  . Enalapril Cough  . Lipitor [Atorvastatin]     Elevated LFT's and Myalgias  . Prednisone     Intolerant to High Dose Prednisone  . Vicodin [Hydrocodone-Acetaminophen]     Increased Anxiety and Nervousness  . Acyclovir And Related Rash    Past Medical History  Diagnosis Date  . CVA (cerebral vascular accident) 27  . Tobacco abuse   . Allergic rhinitis   . Hypertension   . Hyperlipidemia   . Elevated hemoglobin A1c measurement   . Breast cancer   . COPD (chronic obstructive pulmonary disease) (followed by Dr Fannie Knee)       . DJD (degenerative joint disease)   . ASCVD (arteriosclerotic cardiovascular disease)   .      Past Surgical History  Procedure Laterality Date  . Back surgery  2005  . Mastectomy  1990    Right, no adjuvant  . Cholecystectomy  2006  . Abdominal hysterectomy  1976  . Cholecystectomy N/A 2008    Dr. Earlene Plater  . Excision of benign  salivary tumor Left 1968  . Shoulder surgery Left 2007    Dr. Eulah Pont    Family History  Problem Relation Age of Onset  . Emphysema Brother   . Emphysema Brother   . Emphysema Sister   . Allergies Brother     all brothers and sisters  . Asthma Sister   . Asthma Sister   . Heart disease Mother   . Heart disease Sister   . Heart disease Brother   . Cancer Mother     breast  . Cancer Sister     breast    History   Social History  . Marital Status: Married    Spouse Name: N/A    Number of Children: N/A  . Years of Education: N/A   Occupational History  . Not on file.   Social History Main Topics  . Smoking status: Current Every Day Smoker -- 0.30 packs/day for 55 years    Types: Cigarettes  . Smokeless tobacco: Not on file     Comment: Electronic cig as well  . Alcohol Use: No  . Drug Use: Not on file  . Sexual Activity: Not on file   Other Topics Concern  . Not on file   Social History Narrative   Married with children.   Smokes 1/2 ppd currently.  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 Eyes: Denies redness, blurred vision, diplopia, discharge, itchy, watery eyes.  ENT: Denies discharge, congestion, post nasal drip, epistaxis, sore throat, earache, hearing loss, dental pain, Tinnitus, Vertigo, Sinus pain, snoring.  Cardio: Denies chest pain, palpitations, irregular heartbeat, syncope, dyspnea, diaphoresis, orthopnea, PND, claudication, edema Respiratory: occas cough and  dyspnea with moderate exertion/activity, pleurisy, hoarseness, laryngitis, wheezing.  Gastrointestinal: Denies dysphagia, heartburn, reflux, water brash, pain, cramps, nausea, vomiting, bloating, diarrhea, constipation, hematemesis, melena, hematochezia, jaundice, hemorrhoids Genitourinary: Denies dysuria, frequency, urgency, nocturia, hesitancy,  discharge, hematuria, flank pain Musculoskeletal: Denies arthralgia, myalgia, stiffness, Jt. Swelling, pain, limp, and strain/sprain. Skin: Denies puritis, rash, hives, warts, acne, eczema, changing in skin lesion Neuro: Has right sided weakness,but no tremor, incoordination, spasms, paresthesia, pain Psychiatric: Denies confusion, memory loss, sensory loss Endocrine: Denies change in weight, skin, hair change, nocturia, and paresthesia, Diabetic Polys, visual blurring, hyper /hypo glycemic episodes.  Heme/Lymph: Excessive bleeding, bruising, enlarged lymph node  Physical Exam: Filed Vitals:   05/31/13 1404  BP: 142/78  Pulse: 72  Temp: 98.1 F (36.7 C)  Resp: 16   Body mass index is 29.94 kg/(m^2).  General Appearance: Obese, in no apparent distress. Eyes: PERRLA, EOMs, conjunctiva no swelling or erythema, normal fundi and vessels. Sinuses: No Frontal/maxillary tenderness ENT/Mouth: EACs Patent / TMs  nl. Nares clear without erythema, swelling, mucoid exudates. Good dentition. No erythema, swelling, or exudate on post pharynx. Tongue normal, non-obstructing. Tonsils not swollen or erythematous. Hearing normal.  Neck: Supple, thyroid normal. No bruits or JVD. Respiratory: Respiratory effort normal.  BS equal bilaterally without rales, rhonci, wheezing or stridor. Cardio: Heart sounds normal, regular rate and rhythm without murmurs, rubs or gallops. Peripheral pulses brisk and equal bilaterally, without edema. No aortic or femoral bruits. Chest: symmetric, with normal excursions and percussion. Breasts:Left breast normal w/ abn. masses or tenderness. Rt breast surgically absent. Abdomen: Flat, soft, with bowl sounds. Nontender, no guarding, rebound, hernias, masses, or organomegaly.  Lymphatics: Non tender without lymphadenopathy, wears lymphedema sleeve right arm.  Musculoskeletal: Full ROM left extremities,and 1-2/4 mild rt. hemiparesis with mild spasticity, and limping gait. Skin:  Warm, dry without rashes, lesions, ecchymosis.  Neuro: No facial asymetry. Cranial nerves intact, reflexes equal bilaterally. Normal muscle tone left / increased right, no cerebellar symptoms. Sensation intact.  Pysch: Awake and oriented X 3, normal affect, Insight and Judgment appropriate.   Assessment and Plan  1.Hypertension - continue diet, exercise and meds as discussed  2. Hyperlipidemia - continue low cholesterol diet, exercise and meds as discussed  3. Prediabetes/Insulin Resistance - diet, exercise, weight loss as discussed     - discussed trial with imodium/loperamide to avoid other hypoglycemic agents that would either be cost prohibitive or        potentate more weight gain.  4. Vitamin D Deficiency - Supplement as discussed   Plan routine screening labs, EKG, hemoccult, Aortoscan

## 2013-06-01 LAB — MICROALBUMIN / CREATININE URINE RATIO
Microalb Creat Ratio: 7.4 mg/g (ref 0.0–30.0)
Microalb, Ur: 0.5 mg/dL (ref 0.00–1.89)

## 2013-06-01 LAB — INSULIN, FASTING: Insulin fasting, serum: 44 u[IU]/mL — ABNORMAL HIGH (ref 3–28)

## 2013-06-01 LAB — URINALYSIS, MICROSCOPIC ONLY

## 2013-06-01 LAB — VITAMIN D 25 HYDROXY (VIT D DEFICIENCY, FRACTURES): Vit D, 25-Hydroxy: 48 ng/mL (ref 30–89)

## 2013-06-03 ENCOUNTER — Telehealth: Payer: Self-pay | Admitting: Internal Medicine

## 2013-06-03 NOTE — Telephone Encounter (Signed)
Pt called said insurance will not pay for Zetia, she is in doughnut hole.  Please recommend an alternative.  Sending chart back

## 2013-06-03 NOTE — Telephone Encounter (Signed)
Per Dr Oneta Rack, no generic for Zetia and pt allergic to statins.  Dr Oneta Rack suggested pt can stay off Zetia until end of year with a strict diet of no meat, no dairy or cheese, and wt loss. States Zetia will be generic next yr. Pt aware.

## 2013-06-25 ENCOUNTER — Ambulatory Visit: Payer: Medicare Other | Attending: Internal Medicine | Admitting: Occupational Therapy

## 2013-06-25 DIAGNOSIS — IMO0001 Reserved for inherently not codable concepts without codable children: Secondary | ICD-10-CM | POA: Insufficient documentation

## 2013-06-25 DIAGNOSIS — H53419 Scotoma involving central area, unspecified eye: Secondary | ICD-10-CM | POA: Insufficient documentation

## 2013-06-26 ENCOUNTER — Other Ambulatory Visit: Payer: Self-pay | Admitting: Internal Medicine

## 2013-07-05 ENCOUNTER — Encounter: Payer: Self-pay | Admitting: Physician Assistant

## 2013-07-05 ENCOUNTER — Other Ambulatory Visit: Payer: Self-pay | Admitting: Physician Assistant

## 2013-07-05 ENCOUNTER — Ambulatory Visit (INDEPENDENT_AMBULATORY_CARE_PROVIDER_SITE_OTHER): Payer: Medicare Other | Admitting: Physician Assistant

## 2013-07-05 VITALS — BP 122/68 | HR 72 | Temp 98.4°F | Resp 16 | Ht 63.5 in | Wt 167.0 lb

## 2013-07-05 DIAGNOSIS — R1032 Left lower quadrant pain: Secondary | ICD-10-CM

## 2013-07-05 LAB — CBC WITH DIFFERENTIAL/PLATELET
Eosinophils Absolute: 0.2 10*3/uL (ref 0.0–0.7)
Hemoglobin: 14.7 g/dL (ref 12.0–15.0)
Lymphocytes Relative: 35 % (ref 12–46)
Lymphs Abs: 5.5 10*3/uL — ABNORMAL HIGH (ref 0.7–4.0)
MCH: 33.1 pg (ref 26.0–34.0)
MCHC: 35.8 g/dL (ref 30.0–36.0)
MCV: 92.6 fL (ref 78.0–100.0)
Neutro Abs: 8.5 10*3/uL — ABNORMAL HIGH (ref 1.7–7.7)
Neutrophils Relative %: 55 % (ref 43–77)
Platelets: 299 10*3/uL (ref 150–400)
RBC: 4.44 MIL/uL (ref 3.87–5.11)
WBC: 15.5 10*3/uL — ABNORMAL HIGH (ref 4.0–10.5)

## 2013-07-05 MED ORDER — ACETAMINOPHEN-CODEINE #3 300-30 MG PO TABS: 1.0000 | ORAL_TABLET | Freq: Four times a day (QID) | ORAL | Status: DC | PRN

## 2013-07-05 MED ORDER — METRONIDAZOLE 500 MG PO TABS: 500.0000 mg | ORAL_TABLET | Freq: Three times a day (TID) | ORAL | Status: DC

## 2013-07-05 MED ORDER — CIPROFLOXACIN HCL 500 MG PO TABS: 500.0000 mg | ORAL_TABLET | Freq: Two times a day (BID) | ORAL | Status: DC

## 2013-07-05 NOTE — Patient Instructions (Signed)
Diverticulitis °A diverticulum is a small pouch or sac on the colon. Diverticulosis is the presence of these diverticula on the colon. Diverticulitis is the irritation (inflammation) or infection of diverticula. °CAUSES  °The colon and its diverticula contain bacteria. If food particles block the tiny opening to a diverticulum, the bacteria inside can grow and cause an increase in pressure. This leads to infection and inflammation and is called diverticulitis. °SYMPTOMS  °· Abdominal pain and tenderness. Usually, the pain is located on the left side of your abdomen. However, it could be located elsewhere. °· Fever. °· Bloating. °· Feeling sick to your stomach (nausea). °· Throwing up (vomiting). °· Abnormal stools. °DIAGNOSIS  °Your caregiver will take a history and perform a physical exam. Since many things can cause abdominal pain, other tests may be necessary. Tests may include: °· Blood tests. °· Urine tests. °· X-ray of the abdomen. °· CT scan of the abdomen. °Sometimes, surgery is needed to determine if diverticulitis or other conditions are causing your symptoms. °TREATMENT  °Most of the time, you can be treated without surgery. Treatment includes: °· Resting the bowels by only having liquids for a few days. As you improve, you will need to eat a low-fiber diet. °· Intravenous (IV) fluids if you are losing body fluids (dehydrated). °· Antibiotic medicines that treat infections may be given. °· Pain and nausea medicine, if needed. °· Surgery if the inflamed diverticulum has burst. °HOME CARE INSTRUCTIONS  °· Try a clear liquid diet (broth, tea, or water for as long as directed by your caregiver). You may then gradually begin a low-fiber diet as tolerated.  °A low-fiber diet is a diet with less than 10 grams of fiber. Choose the foods below to reduce fiber in the diet: °· White breads, cereals, rice, and pasta. °· Cooked fruits and vegetables or soft fresh fruits and vegetables without the skin. °· Ground or  well-cooked tender beef, ham, veal, lamb, pork, or poultry. °· Eggs and seafood. °· After your diverticulitis symptoms have improved, your caregiver may put you on a high-fiber diet. A high-fiber diet includes 14 grams of fiber for every 1000 calories consumed. For a standard 2000 calorie diet, you would need 28 grams of fiber. Follow these diet guidelines to help you increase the fiber in your diet. It is important to slowly increase the amount fiber in your diet to avoid gas, constipation, and bloating. °· Choose whole-grain breads, cereals, pasta, and brown rice. °· Choose fresh fruits and vegetables with the skin on. Do not overcook vegetables because the more vegetables are cooked, the more fiber is lost. °· Choose more nuts, seeds, legumes, dried peas, beans, and lentils. °· Look for food products that have greater than 3 grams of fiber per serving on the Nutrition Facts label. °· Take all medicine as directed by your caregiver. °· If your caregiver has given you a follow-up appointment, it is very important that you go. Not going could result in lasting (chronic) or permanent injury, pain, and disability. If there is any problem keeping the appointment, call to reschedule. °SEEK MEDICAL CARE IF:  °· Your pain does not improve. °· You have a hard time advancing your diet beyond clear liquids. °· Your bowel movements do not return to normal. °SEEK IMMEDIATE MEDICAL CARE IF:  °· Your pain becomes worse. °· You have an oral temperature above 102° F (38.9° C), not controlled by medicine. °· You have repeated vomiting. °· You have bloody or black, tarry stools. °·   Symptoms that brought you to your caregiver become worse or are not getting better. °MAKE SURE YOU:  °· Understand these instructions. °· Will watch your condition. °· Will get help right away if you are not doing well or get worse. °Document Released: 04/17/2005 Document Revised: 09/30/2011 Document Reviewed: 08/13/2010 °[employer]® Patient Information  ©2014 [employer]. ° °

## 2013-07-05 NOTE — Progress Notes (Signed)
   Subjective:    Patient ID: [name], female    DOB: 05/31/1941, 72 y.o.   MRN: [id number]  Abdominal Pain This is a new problem. The current episode started in the past 7 days. The onset quality is gradual. The problem occurs constantly. The problem has been gradually worsening. The pain is located in the suprapubic region, LLQ and left flank. The pain is at a severity of 5/10. The pain is moderate. The quality of the pain is sharp (stabbing). The abdominal pain radiates to the left flank. Associated symptoms include diarrhea (Has had diarrhea since being on Metformin), dysuria, frequency and myalgias. Pertinent negatives include no anorexia, arthralgias, belching, constipation, fever, flatus, headaches, hematochezia, hematuria, melena, nausea, vomiting or weight loss. The pain is aggravated by coughing, urination and movement. The pain is relieved by nothing. Treatments tried: NSAID. The treatment provided mild relief. Her past medical history is significant for abdominal surgery (Choley, hysterectomy).   Review of Systems  Constitutional: Positive for chills. Negative for fever and weight loss.  HENT: Negative.   Eyes: Negative.   Respiratory: Positive for cough.   Cardiovascular: Negative.   Gastrointestinal: Positive for abdominal pain, diarrhea (Has had diarrhea since being on Metformin) and abdominal distention. Negative for nausea, vomiting, constipation, blood in stool, melena, hematochezia, anal bleeding, rectal pain, anorexia and flatus.  Genitourinary: Positive for dysuria and frequency. Negative for hematuria.  Musculoskeletal: Positive for myalgias. Negative for arthralgias.  Neurological: Negative for headaches.       Objective:   Physical Exam  Vitals reviewed. Constitutional: She appears well-developed and well-nourished.  HENT:  Head: Normocephalic and atraumatic.  Eyes: Conjunctivae and EOM are normal. Pupils are equal, round, and reactive to light.  Neck: Normal  range of motion. Neck supple.  Cardiovascular: Normal rate, regular rhythm and normal heart sounds.   Pulmonary/Chest: Effort normal and breath sounds normal.  Abdominal: Soft. She exhibits no distension and no mass. There is tenderness. There is rebound. There is no guarding.  + LLQ rebound tenderness with rebound tenderness.   Skin: She is not diaphoretic.      Assessment & Plan:  Abdominal pain, left lower quadrant - Plan: CBC with Differential, Hepatic function panel, Urinalysis with microscopic, CT Abdomen Pelvis W Wo Contrast, acetaminophen-codeine (TYLENOL #3) 300-30 MG per tablet, ciprofloxacin (CIPRO) 500 MG tablet, metroNIDAZOLE (FLAGYL) 500 MG tablet, Urine culture  Concerned for diverticulitis and then pyelonephritis- patient would like to be treated out patient. She will go to the ER if her symptoms get worse.

## 2013-07-06 LAB — HEPATIC FUNCTION PANEL
Albumin: 4.7 g/dL (ref 3.5–5.2)
Alkaline Phosphatase: 76 U/L (ref 39–117)
Bilirubin, Direct: 0.2 mg/dL (ref 0.0–0.3)
Indirect Bilirubin: 0.4 mg/dL (ref 0.0–0.9)
Total Bilirubin: 0.6 mg/dL (ref 0.3–1.2)

## 2013-07-06 LAB — BASIC METABOLIC PANEL
CO2: 21 mEq/L (ref 19–32)
Calcium: 10.2 mg/dL (ref 8.4–10.5)
Chloride: 102 mEq/L (ref 96–112)
Sodium: 135 mEq/L (ref 135–145)

## 2013-07-06 LAB — URINALYSIS, ROUTINE W REFLEX MICROSCOPIC
Bilirubin Urine: NEGATIVE
Ketones, ur: NEGATIVE mg/dL
Leukocytes, UA: NEGATIVE
Nitrite: NEGATIVE
Specific Gravity, Urine: 1.015 (ref 1.005–1.030)
pH: 7.5 (ref 5.0–8.0)

## 2013-07-07 LAB — URINE CULTURE: Organism ID, Bacteria: NO GROWTH

## 2013-07-08 ENCOUNTER — Ambulatory Visit
Admission: RE | Admit: 2013-07-08 | Discharge: 2013-07-08 | Disposition: A | Discharge status: Home / Self Care | Payer: Medicare Other | Source: Ambulatory Visit | Attending: Physician Assistant | Admitting: Physician Assistant

## 2013-07-08 MED ORDER — IOHEXOL 300 MG/ML  SOLN
100.0000 mL | Freq: Once | INTRAMUSCULAR | Status: AC | PRN
  Administered 2013-07-08: 100 mL via INTRAVENOUS

## 2013-07-29 ENCOUNTER — Ambulatory Visit (INDEPENDENT_AMBULATORY_CARE_PROVIDER_SITE_OTHER): Payer: Medicare HMO | Admitting: Physician Assistant

## 2013-07-29 ENCOUNTER — Encounter: Payer: Self-pay | Admitting: Physician Assistant

## 2013-07-29 VITALS — BP 130/68 | HR 72 | Temp 98.1°F | Resp 16 | Wt 166.0 lb

## 2013-07-29 DIAGNOSIS — R1032 Left lower quadrant pain: Secondary | ICD-10-CM

## 2013-07-29 LAB — CBC WITH DIFFERENTIAL/PLATELET
Basophils Absolute: 0 10*3/uL (ref 0.0–0.1)
Basophils Relative: 0 % (ref 0–1)
Eosinophils Absolute: 0.2 10*3/uL (ref 0.0–0.7)
Eosinophils Relative: 2 % (ref 0–5)
HCT: 42.1 % (ref 36.0–46.0)
Hemoglobin: 14.7 g/dL (ref 12.0–15.0)
LYMPHS ABS: 5.7 10*3/uL — AB (ref 0.7–4.0)
Lymphocytes Relative: 56 % — ABNORMAL HIGH (ref 12–46)
MCH: 33.1 pg (ref 26.0–34.0)
MCHC: 34.9 g/dL (ref 30.0–36.0)
MCV: 94.8 fL (ref 78.0–100.0)
Monocytes Absolute: 0.8 10*3/uL (ref 0.1–1.0)
Monocytes Relative: 8 % (ref 3–12)
NEUTROS ABS: 3.4 10*3/uL (ref 1.7–7.7)
NEUTROS PCT: 34 % — AB (ref 43–77)
PLATELETS: 304 10*3/uL (ref 150–400)
RBC: 4.44 MIL/uL (ref 3.87–5.11)
RDW: 13.2 % (ref 11.5–15.5)
WBC: 10.2 10*3/uL (ref 4.0–10.5)

## 2013-07-29 MED ORDER — METRONIDAZOLE 500 MG PO TABS: 500.0000 mg | ORAL_TABLET | Freq: Three times a day (TID) | ORAL | Status: AC

## 2013-07-29 MED ORDER — CIPROFLOXACIN HCL 500 MG PO TABS: 500.0000 mg | ORAL_TABLET | Freq: Two times a day (BID) | ORAL | Status: AC

## 2013-07-29 NOTE — Progress Notes (Signed)
   Subjective:    Patient ID: [name], female    DOB: [birth year], 73 y.o.   MRN: [id number]  HPI Patient is here for a follow up for Diverticulitis. She was here on 07/05/2013 and had LL AB pain. She was given Cipro and Flagyl. She had a CT AB that showed uncomplicated diverticulitis. She has finished her ABX and states she feels better but she continues to have AB pain, constant dull ache, worse with BM, loose stools twice a day with no blood. Some nausea, GERD, and decreased appetite. Denies fever, chills, vomiting, blood in stool.   Lab Results  Component Value Date   WBC 15.5* 07/05/2013   HGB 14.7 07/05/2013   HCT 41.1 07/05/2013   MCV 92.6 07/05/2013   PLT 299 07/05/2013   CTAB: IMPRESSION:  1. Findings are compatible with uncomplicated acute diverticulitis  involving the sigmoid colon.  2. Hepatic steatosis. The contour the liver is slightly nodular.  Correlate for any clinical signs of cirrhosis.  3. Atherosclerotic disease noted.   Review of Systems All negative except for above    Objective:   Physical Exam  Vitals reviewed. Constitutional: She is oriented to person, place, and time. She appears well-developed and well-nourished.  Neck: Normal range of motion. Neck supple.  Cardiovascular: Normal rate and regular rhythm.   Pulmonary/Chest: Effort normal and breath sounds normal.  Abdominal: Soft. Bowel sounds are normal. She exhibits no mass. There is tenderness (LLQ tenderness). There is guarding. There is no rebound.  Neurological: She is alert and oriented to person, place, and time.  Skin: Skin is warm and dry. No rash noted.       Assessment & Plan:  Diverticulitis with continuing AB pain-  Bland diet, liquids, check CBC and BMP and Mag.  Given samples on Pentasa 2 weeks for cramping.  Will start another round of ABX since continuing pain  Due for colonoscopy this year, will follow up with GI

## 2013-07-29 NOTE — Patient Instructions (Signed)
Diverticulitis °A diverticulum is a small pouch or sac on the colon. Diverticulosis is the presence of these diverticula on the colon. Diverticulitis is the irritation (inflammation) or infection of diverticula. °CAUSES  °The colon and its diverticula contain bacteria. If food particles block the tiny opening to a diverticulum, the bacteria inside can grow and cause an increase in pressure. This leads to infection and inflammation and is called diverticulitis. °SYMPTOMS  °· Abdominal pain and tenderness. Usually, the pain is located on the left side of your abdomen. However, it could be located elsewhere. °· Fever. °· Bloating. °· Feeling sick to your stomach (nausea). °· Throwing up (vomiting). °· Abnormal stools. °DIAGNOSIS  °Your caregiver will take a history and perform a physical exam. Since many things can cause abdominal pain, other tests may be necessary. Tests may include: °· Blood tests. °· Urine tests. °· X-ray of the abdomen. °· CT scan of the abdomen. °Sometimes, surgery is needed to determine if diverticulitis or other conditions are causing your symptoms. °TREATMENT  °Most of the time, you can be treated without surgery. Treatment includes: °· Resting the bowels by only having liquids for a few days. As you improve, you will need to eat a low-fiber diet. °· Intravenous (IV) fluids if you are losing body fluids (dehydrated). °· Antibiotic medicines that treat infections may be given. °· Pain and nausea medicine, if needed. °· Surgery if the inflamed diverticulum has burst. °HOME CARE INSTRUCTIONS  °· Try a clear liquid diet (broth, tea, or water for as long as directed by your caregiver). You may then gradually begin a low-fiber diet as tolerated.  °A low-fiber diet is a diet with less than 10 grams of fiber. Choose the foods below to reduce fiber in the diet: °· White breads, cereals, rice, and pasta. °· Cooked fruits and vegetables or soft fresh fruits and vegetables without the skin. °· Ground or  well-cooked tender beef, ham, veal, lamb, pork, or poultry. °· Eggs and seafood. °· After your diverticulitis symptoms have improved, your caregiver may put you on a high-fiber diet. A high-fiber diet includes 14 grams of fiber for every 1000 calories consumed. For a standard 2000 calorie diet, you would need 28 grams of fiber. Follow these diet guidelines to help you increase the fiber in your diet. It is important to slowly increase the amount fiber in your diet to avoid gas, constipation, and bloating. °· Choose whole-grain breads, cereals, pasta, and brown rice. °· Choose fresh fruits and vegetables with the skin on. Do not overcook vegetables because the more vegetables are cooked, the more fiber is lost. °· Choose more nuts, seeds, legumes, dried peas, beans, and lentils. °· Look for food products that have greater than 3 grams of fiber per serving on the Nutrition Facts label. °· Take all medicine as directed by your caregiver. °· If your caregiver has given you a follow-up appointment, it is very important that you go. Not going could result in lasting (chronic) or permanent injury, pain, and disability. If there is any problem keeping the appointment, call to reschedule. °SEEK MEDICAL CARE IF:  °· Your pain does not improve. °· You have a hard time advancing your diet beyond clear liquids. °· Your bowel movements do not return to normal. °SEEK IMMEDIATE MEDICAL CARE IF:  °· Your pain becomes worse. °· You have an oral temperature above 102° F (38.9° C), not controlled by medicine. °· You have repeated vomiting. °· You have bloody or black, tarry stools. °·   Symptoms that brought you to your caregiver become worse or are not getting better. °MAKE SURE YOU:  °· Understand these instructions. °· Will watch your condition. °· Will get help right away if you are not doing well or get worse. °Document Released: 04/17/2005 Document Revised: 09/30/2011 Document Reviewed: 08/13/2010 °[employer]® Patient Information  ©2014 [employer]. ° °

## 2013-07-30 LAB — BASIC METABOLIC PANEL WITH GFR
BUN: 12 mg/dL (ref 6–23)
CHLORIDE: 103 meq/L (ref 96–112)
CO2: 26 mEq/L (ref 19–32)
Calcium: 10.6 mg/dL — ABNORMAL HIGH (ref 8.4–10.5)
Creat: 0.7 mg/dL (ref 0.50–1.10)
GFR, Est African American: >89 mL/min
GFR, Est Non African American: 87 mL/min
Glucose, Bld: 110 mg/dL — ABNORMAL HIGH (ref 70–99)
POTASSIUM: 4.3 meq/L (ref 3.5–5.3)
SODIUM: 138 meq/L (ref 135–145)

## 2013-07-30 LAB — MAGNESIUM: MAGNESIUM: 1.9 mg/dL (ref 1.5–2.5)

## 2013-08-24 ENCOUNTER — Ambulatory Visit (INDEPENDENT_AMBULATORY_CARE_PROVIDER_SITE_OTHER): Payer: Medicare HMO | Admitting: Physician Assistant

## 2013-08-24 ENCOUNTER — Encounter: Payer: Self-pay | Admitting: Physician Assistant

## 2013-08-24 VITALS — BP 132/72 | HR 76 | Temp 98.1°F | Resp 16 | Ht 63.5 in | Wt 166.0 lb

## 2013-08-24 DIAGNOSIS — E559 Vitamin D deficiency, unspecified: Secondary | ICD-10-CM

## 2013-08-24 DIAGNOSIS — K5730 Diverticulosis of large intestine without perforation or abscess without bleeding: Secondary | ICD-10-CM | POA: Insufficient documentation

## 2013-08-24 DIAGNOSIS — R7309 Other abnormal glucose: Secondary | ICD-10-CM

## 2013-08-24 DIAGNOSIS — I10 Essential (primary) hypertension: Secondary | ICD-10-CM

## 2013-08-24 DIAGNOSIS — F17200 Nicotine dependence, unspecified, uncomplicated: Secondary | ICD-10-CM

## 2013-08-24 DIAGNOSIS — Z79899 Other long term (current) drug therapy: Secondary | ICD-10-CM

## 2013-08-24 DIAGNOSIS — E785 Hyperlipidemia, unspecified: Secondary | ICD-10-CM

## 2013-08-24 MED ORDER — CIPROFLOXACIN HCL 500 MG PO TABS: 500.0000 mg | ORAL_TABLET | Freq: Two times a day (BID) | ORAL | Status: DC

## 2013-08-24 NOTE — Progress Notes (Signed)
HPI Patient presents for 3 month follow up with hypertension, hyperlipidemia, prediabetes and vitamin D.  Patient's blood pressure has been controlled at home, today their BP is BP: 132/72 mmHg  Patient denies chest pain, shortness of breath, dizziness.   Patient's cholesterol is diet controlled. She is off zetia due to cost. His cholesterol is not at goal. The cholesterol last visit was:   Lab Results  Component Value Date   CHOL 154 05/31/2013   HDL 34* 05/31/2013   TRIG 452* 05/31/2013   CHOLHDL 4.5 05/31/2013    she has been working on diet and exercise for preiabetes, and denies blurry vision, polydipsia, polyphagia and polyuria. His A1C is not at goal. She states she has decreased pasta/white stuff but still drinks juices.  Last A1C in the office was:  Lab Results  Component Value Date   HGBA1C 7.0* 05/31/2013   Patient is on Vitamin D supplement.    She had a recent diverticulitis infection, treated with ABX, she continues to have diarrhea some mornings, goes to the bathroom 4 times a day, states she can not eat anything without having to have a BM, sometime well formed, sometimes not. Denies blood, black stool. She has had some chills yesterday/today.    CT 07/08/2013 IMPRESSION:  1. Findings are compatible with uncomplicated acute diverticulitis  involving the sigmoid colon.  2. Hepatic steatosis. The contour the liver is slightly nodular.  Correlate for any clinical signs of cirrhosis.  3. Atherosclerotic disease noted.  Current Medications:  Current Outpatient Prescriptions on File Prior to Visit  Medication Sig Dispense Refill  . acetaminophen-codeine (TYLENOL #3) 300-30 MG per tablet Take 1-2 tablets by mouth every 6 (six) hours as needed.  60 tablet  0  . aspirin 81 MG tablet Take 81 mg by mouth daily.        . Cholecalciferol (VITAMIN D3) 2000 UNITS TABS Take 3 tablets by mouth daily. 6000u per day      . ezetimibe (ZETIA) 10 MG tablet Take 10 mg by mouth daily.         . fenofibrate micronized (LOFIBRA) 134 MG capsule TAKE ONE CAPSULE BY MOUTH EVERY DAY  90 capsule  1  . fish oil-omega-3 fatty acids 1000 MG capsule Take 1 capsule by mouth 3 (three) times daily.       . Flaxseed, Linseed, (FLAX SEED OIL) 1000 MG CAPS Take 1 capsule by mouth 3 (three) times daily.        . fluticasone (FLONASE) 50 MCG/ACT nasal spray Place 2 sprays into the nose daily. At bedtime  16 g  prn  . furosemide (LASIX) 40 MG tablet Take 40 mg by mouth. Takes 1/2 tablet daily by mouth      . losartan (COZAAR) 100 MG tablet Take 100 mg by mouth daily.        . magnesium oxide (MAG-OX) 400 MG tablet Take 400 mg by mouth 3 (three) times daily.       . metFORMIN (GLUMETZA) 500 MG (MOD) 24 hr tablet Take 500 mg by mouth daily.       Marland Kitchen tiotropium (SPIRIVA) 18 MCG inhalation capsule Place 1 capsule (18 mcg total) into inhaler and inhale daily.  30 capsule  prn  . albuterol (VENTOLIN HFA) 108 (90 BASE) MCG/ACT inhaler Inhale 2 puffs into the lungs every 6 (six) hours as needed for wheezing or shortness of breath.  1 Inhaler  prn   No current facility-administered medications on file prior to visit.  Medical History:  Past Medical History  Diagnosis Date  . CVA (cerebral vascular accident) 37  . Tobacco abuse   . Allergic rhinitis   . Hypertension   . Hyperlipidemia   . Elevated hemoglobin A1c measurement   . Breast cancer   . COPD (chronic obstructive pulmonary disease)   . Lymphocytosis   . DJD (degenerative joint disease)   . ASCVD (arteriosclerotic cardiovascular disease)   . CVA (cerebral vascular accident)    Allergies:  Allergies  Allergen Reactions  . Ace Inhibitors   . Acyclovir   . Amoxil [Amoxicillin] Diarrhea  . Bisoprolol     Decreased Heart Rate  . Crestor [Rosuvastatin]     Elevated LFT's and Myalgias  . Darvocet [Propoxyphene N-Acetaminophen]     Bad Dreams  . Doxycycline Diarrhea  . Enalapril Cough  . Lipitor [Atorvastatin]     Elevated LFT's and  Myalgias  . Prednisone     Intolerant to High Dose Prednisone  . Vicodin [Hydrocodone-Acetaminophen]     Increased Anxiety and Nervousness  . Acyclovir And Related Rash    ROS Constitutional: +  fever, chills Denies headaches, insomnia, fatigue, night sweats Eyes: Denies redness, blurred vision, diplopia, discharge, itchy, watery eyes.  ENT: Denies congestion, post nasal drip, sore throat, earache, dental pain, Tinnitus, Vertigo, Sinus pain, snoring.  Cardio: Denies chest pain, palpitations, irregular heartbeat, dyspnea, diaphoresis, orthopnea, PND, claudication, edema Respiratory: + cough, productive for 5 days, shortness of breath, wheezing.  Gastrointestinal: + diarrhea, AB pain Denies dysphagia, heartburn,  N/V, constipation, hematemesis, melena, hematochezia,  hemorrhoids Genitourinary: Denies dysuria, frequency, urgency, nocturia, hesitancy, discharge, hematuria, flank pain Musculoskeletal: Denies myalgia, stiffness, pain, swelling and strain/sprain. Skin: Denies pruritis, rash, changing in skin lesion Neuro: Denies Weakness, tremor, incoordination, spasms, pain Psychiatric: Denies confusion, memory loss, sensory loss Endocrine: Denies change in weight, skin, hair change, nocturia Diabetic Polys, Denies visual blurring, hyper /hypo glycemic episodes, and paresthesia, Heme/Lymph: Denies Excessive bleeding, bruising, enlarged lymph nodes  Family history- Review and unchanged Social history- Review and unchanged Physical Exam: Filed Vitals:   08/24/13 1434  BP: 132/72  Pulse: 76  Temp: 98.1 F (36.7 C)  Resp: 16   Filed Weights   08/24/13 1434  Weight: 166 lb (75.297 kg)   General Appearance: Well nourished, in no apparent distress. Eyes: PERRLA, EOMs, conjunctiva no swelling or erythema Sinuses: No Frontal/maxillary tenderness ENT/Mouth: Ext aud canals clear, TMs without erythema, bulging. No erythema, swelling, or exudate on post pharynx.  Tonsils not swollen or  erythematous. Hearing normal.  Neck: Supple, thyroid normal.  Respiratory: Respiratory effort normal, BS equal bilaterally + diffuse mild wheezing without rales, rhonchi, or stridor.  Cardio: RRR with no MRGs. Brisk peripheral pulses without edema.  Abdomen: Soft, + BS.  Tender LLQ , no guarding, rebound, hernias, masses. Lymphatics: Non tender without lymphadenopathy.  Musculoskeletal: Full ROM, 5/5 strength, normal gait.  Skin: Warm, dry without rashes, lesions, ecchymosis.  Neuro: Cranial nerves intact. 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  Psych: Awake and oriented X 3, normal affect, Insight and Judgment appropriate.   Assessment and Plan:  Hypertension: Continue medication, monitor blood pressure at home. Continue DASH diet. Cholesterol: Continue diet and exercise. Check cholesterol.  Pre-diabetes-Continue diet and exercise. Check A1C Vitamin D Def- check level and continue medications.  Diarrhea- ? From diverticulitis, ? From metformin will give rules of MF or can stop, suggest following up with GI, we will put her on ABX that covers bronchitis- Cipro Bronchitis/AECOPD- + wheezing, smoking cessation discussed-  Cipro Smoking cessation- trying to quit at home.   Continue diet and meds as discussed. Further disposition pending results of labs. OVER 40 minutes of exam, counseling, chart review, referral performed  Vicie Mutters 3:03 PM

## 2013-08-24 NOTE — Patient Instructions (Signed)
Stop the metformin for one week THEN try with your largest meal  If your stomach/diarrhea does not get better OR if it gets worse please call Dr. Deatra Ina. Phone: [phone]   We are starting you on Metformin to prevent or treat diabetes. Metformin does not cause low blood sugars. In order to create energy your cells need insulin and sugar but sometime your cells do not accept the insulin and this can cause increased sugars and decreased energy. The Metformin helps your cells accept insulin and the sugar to give you more energy.   The two most common side effects are nausea and diarrhea, follow these rules to avoid it! You can take imodium per box instructions when starting metformin if needed.   Rules of metformin: 1) start out slow with only one pill daily. Our goal for you is 4 pills a day or 2000mg  total.  2) take with your largest meal. 3) Take with least amount of carbs.   Call if you have any problems.     Bad carbs also include fruit juice, alcohol, and sweet tea. These are empty calories that do not signal to your brain that you are full.   Please remember the good carbs are still carbs which convert into sugar. So please measure them out no more than 1/2-1 cup of rice, oatmeal, pasta, and beans.  Veggies are however free foods! Pile them on.   I like lean protein at every meal such as chicken, [place], pork chops, cottage cheese, etc. Just do not fry these meats and please center your meal around vegetable, the meats should be a side dish.   No all fruit is created equal. Please see the list below, the fruit at the bottom is higher in sugars than the fruit at the top

## 2013-08-25 ENCOUNTER — Telehealth: Payer: Self-pay | Admitting: Gastroenterology

## 2013-08-25 LAB — CBC WITH DIFFERENTIAL/PLATELET
BASOS ABS: 0 10*3/uL (ref 0.0–0.1)
Basophils Relative: 0 % (ref 0–1)
Eosinophils Absolute: 0.2 10*3/uL (ref 0.0–0.7)
Eosinophils Relative: 2 % (ref 0–5)
HCT: 41.7 % (ref 36.0–46.0)
HEMOGLOBIN: 14.5 g/dL (ref 12.0–15.0)
LYMPHS PCT: 47 % — AB (ref 12–46)
Lymphs Abs: 5.4 10*3/uL — ABNORMAL HIGH (ref 0.7–4.0)
MCH: 32.4 pg (ref 26.0–34.0)
MCHC: 34.8 g/dL (ref 30.0–36.0)
MCV: 93.3 fL (ref 78.0–100.0)
MONO ABS: 1.1 10*3/uL — AB (ref 0.1–1.0)
MONOS PCT: 9 % (ref 3–12)
NEUTROS ABS: 4.8 10*3/uL (ref 1.7–7.7)
NEUTROS PCT: 42 % — AB (ref 43–77)
Platelets: 286 10*3/uL (ref 150–400)
RBC: 4.47 MIL/uL (ref 3.87–5.11)
RDW: 13.4 % (ref 11.5–15.5)
WBC: 11.5 10*3/uL — AB (ref 4.0–10.5)

## 2013-08-25 LAB — TSH: TSH: 2.137 u[IU]/mL (ref 0.350–4.500)

## 2013-08-25 LAB — MAGNESIUM: Magnesium: 2.1 mg/dL (ref 1.5–2.5)

## 2013-08-25 LAB — BASIC METABOLIC PANEL WITH GFR
BUN: 14 mg/dL (ref 6–23)
CHLORIDE: 105 meq/L (ref 96–112)
CO2: 24 mEq/L (ref 19–32)
Calcium: 10.2 mg/dL (ref 8.4–10.5)
Creat: 0.78 mg/dL (ref 0.50–1.10)
GFR, EST NON AFRICAN AMERICAN: 76 mL/min
GFR, Est African American: 88 mL/min
Glucose, Bld: 118 mg/dL — ABNORMAL HIGH (ref 70–99)
Potassium: 4.1 mEq/L (ref 3.5–5.3)
SODIUM: 138 meq/L (ref 135–145)

## 2013-08-25 LAB — HEPATIC FUNCTION PANEL
ALBUMIN: 4.2 g/dL (ref 3.5–5.2)
ALT: 36 U/L — AB (ref 0–35)
AST: 28 U/L (ref 0–37)
Alkaline Phosphatase: 68 U/L (ref 39–117)
BILIRUBIN TOTAL: 0.3 mg/dL (ref 0.2–1.2)
Bilirubin, Direct: 0.1 mg/dL (ref 0.0–0.3)
Indirect Bilirubin: 0.2 mg/dL (ref 0.2–1.2)
TOTAL PROTEIN: 7.1 g/dL (ref 6.0–8.3)

## 2013-08-25 LAB — HEMOGLOBIN A1C
Hgb A1c MFr Bld: 7.1 % — ABNORMAL HIGH (ref <5.7)
MEAN PLASMA GLUCOSE: 157 mg/dL — AB (ref <117)

## 2013-08-25 LAB — INSULIN, FASTING: Insulin fasting, serum: 69 u[IU]/mL — ABNORMAL HIGH (ref 3–28)

## 2013-08-25 LAB — LIPID PANEL
CHOL/HDL RATIO: 4.5 ratio
Cholesterol: 190 mg/dL (ref 0–200)
HDL: 42 mg/dL (ref >39)
LDL Cholesterol: 74 mg/dL (ref 0–99)
TRIGLYCERIDES: 372 mg/dL — AB (ref <150)
VLDL: 74 mg/dL — ABNORMAL HIGH (ref 0–40)

## 2013-08-25 LAB — VITAMIN D 25 HYDROXY (VIT D DEFICIENCY, FRACTURES): Vit D, 25-Hydroxy: 56 ng/mL (ref 30–89)

## 2013-08-25 NOTE — Telephone Encounter (Signed)
Pt has been having diarrhea and abdominal pain for several months. State PCP suggested they see GI. Pt scheduled to see Dr. Deatra Ina 08/31/13@10 :30am. Pt aware of appt.

## 2013-08-26 ENCOUNTER — Telehealth: Payer: Self-pay | Admitting: *Deleted

## 2013-08-26 ENCOUNTER — Ambulatory Visit (INDEPENDENT_AMBULATORY_CARE_PROVIDER_SITE_OTHER): Payer: Medicare HMO | Admitting: Gastroenterology

## 2013-08-26 ENCOUNTER — Encounter: Payer: Self-pay | Admitting: Gastroenterology

## 2013-08-26 ENCOUNTER — Other Ambulatory Visit: Payer: Medicare HMO

## 2013-08-26 VITALS — BP 140/80 | HR 66 | Ht 63.5 in | Wt 165.2 lb

## 2013-08-26 DIAGNOSIS — R1032 Left lower quadrant pain: Secondary | ICD-10-CM

## 2013-08-26 DIAGNOSIS — R197 Diarrhea, unspecified: Secondary | ICD-10-CM

## 2013-08-26 MED ORDER — METRONIDAZOLE 500 MG PO TABS: 500.0000 mg | ORAL_TABLET | Freq: Three times a day (TID) | ORAL | Status: DC

## 2013-08-26 MED ORDER — CIPROFLOXACIN HCL 500 MG PO TABS: 500.0000 mg | ORAL_TABLET | Freq: Two times a day (BID) | ORAL | Status: DC

## 2013-08-26 NOTE — Progress Notes (Signed)
08/26/2013 [name] [id number] [birth year]   HISTORY OF PRESENT ILLNESS:  This is a 73 year old female who is known to Dr. Deatra Ina for previous colonoscopy in May 2005 at which time she was found to have only diverticulosis. She has multiple medical problems including history of CVA with residual deficit, tobacco abuse, hypertension, hyperlipidemia, diabetes mellitus, history of breast cancer, COPD, arteriosclerotic cardiovascular disease. She has been referred back to our office on this occasion by her PCP, Dr. Melford Aase, for evaluation of left lower quadrant abdominal pain and diarrhea. She states that the diarrhea has been present since September after she was started on metformin. She has diarrhea approximately 4-5 times a day. She has recently been told to hold metformin if that helps the diarrhea. She denies any nausea, vomiting, fever, or bloody stools.  Also, since sometime in November she's been experiencing left lower quadrant abdominal pain.  She saw her PCP in December and labs demonstrated a leukocytosis of 15.5. A CT scan of the abdomen and pelvis was ordered with contrast and showed findings compatible with uncomplicated acute diverticulitis involving the sigmoid colon. She was also noted to have hepatic steatosis with a slightly nodular liver contour as well as atherosclerotic disease. She was placed on a 7 day course of Cipro twice a day and Flagyl. She says that despite the antibiotics the pain never completely went away and only just improvede minimally. She was seen again recently by her PCP and was placed on Cipro a couple of days ago, but states that she only took it for one day and it caused her diarrhea to be worse so she discontinued it; she now asked for her to be added to her medication allergy/intolerance list. Pain still reaches an intensity of 9/10 at times and seems to be worse after eating. It is currently about a 5/10 on the pain scale. She had repeat labs performed on February  3 with improved blood blood cell count on CBC. CMP and TSH were unremarkable.   Past Medical History  Diagnosis Date  . CVA (cerebral vascular accident) 76  . Tobacco abuse   . Allergic rhinitis   . Hypertension   . Hyperlipidemia   . Elevated hemoglobin A1c measurement   . Breast cancer     right  . COPD (chronic obstructive pulmonary disease)   . Lymphocytosis   . DJD (degenerative joint disease)   . ASCVD (arteriosclerotic cardiovascular disease)   . CVA (cerebral vascular accident)   . Atherosclerosis of aorta 2014    via CT AB/Pelvis- no AAA   Past Surgical History  Procedure Laterality Date  . Back surgery  2005  . Mastectomy  1990    Right, no adjuvant  . Cholecystectomy  2006  . Abdominal hysterectomy  1976  . Cholecystectomy N/A 2008    Dr. Rosana Hoes  . Excision of benign salivary tumor Left 1968  . Shoulder surgery Left 2007    Dr. Percell Miller    reports that she has been smoking Cigarettes.  She has a 27.5 pack-year smoking history. She has never used smokeless tobacco. She reports that she does not drink alcohol or use illicit drugs. family history includes Allergies in her brother; Asthma in her sister and sister; Cancer in her mother and sister; Emphysema in her brother, brother, and sister; Heart disease in her brother, mother, and sister. There is no history of Colon cancer. Allergies  Allergen Reactions  . Ace Inhibitors   . Acyclovir   . Amoxil [Amoxicillin] Diarrhea  .  Bisoprolol     Decreased Heart Rate  . Ciprofloxacin Diarrhea  . Crestor [Rosuvastatin]     Elevated LFT's and Myalgias  . Darvocet [Propoxyphene N-Acetaminophen]     Bad Dreams  . Doxycycline Diarrhea  . Enalapril Cough  . Lipitor [Atorvastatin]     Elevated LFT's and Myalgias  . Metformin And Related Diarrhea  . Prednisone     Intolerant to High Dose Prednisone  . Vicodin [Hydrocodone-Acetaminophen]     Increased Anxiety and Nervousness  . Acyclovir And Related Rash       Outpatient Encounter Prescriptions as of 08/26/2013  Medication Sig  . acetaminophen-codeine (TYLENOL #3) 300-30 MG per tablet Take 1-2 tablets by mouth every 6 (six) hours as needed.  Marland Kitchen aspirin 81 MG tablet Take 81 mg by mouth daily.    . Cholecalciferol (VITAMIN D3) 2000 UNITS TABS Take 3 tablets by mouth daily. 6000u per day  . fenofibrate micronized (LOFIBRA) 134 MG capsule TAKE ONE CAPSULE BY MOUTH EVERY DAY  . fish oil-omega-3 fatty acids 1000 MG capsule Take 1 capsule by mouth 3 (three) times daily.   . Flaxseed, Linseed, (FLAX SEED OIL) 1000 MG CAPS Take 1 capsule by mouth 3 (three) times daily.    . fluticasone (FLONASE) 50 MCG/ACT nasal spray Place 2 sprays into the nose daily. At bedtime  . furosemide (LASIX) 40 MG tablet Take 40 mg by mouth. Takes 1/2 tablet daily by mouth  . losartan (COZAAR) 100 MG tablet Take 100 mg by mouth daily.    . magnesium oxide (MAG-OX) 400 MG tablet Take 400 mg by mouth 3 (three) times daily.   . metFORMIN (GLUMETZA) 500 MG (MOD) 24 hr tablet Take 500 mg by mouth daily.   Marland Kitchen tiotropium (SPIRIVA) 18 MCG inhalation capsule Place 1 capsule (18 mcg total) into inhaler and inhale daily.  Marland Kitchen albuterol (VENTOLIN HFA) 108 (90 BASE) MCG/ACT inhaler Inhale 2 puffs into the lungs every 6 (six) hours as needed for wheezing or shortness of breath.  . ciprofloxacin (CIPRO) 500 MG tablet Take 1 tablet (500 mg total) by mouth 2 (two) times daily.  . metroNIDAZOLE (FLAGYL) 500 MG tablet Take 1 tablet (500 mg total) by mouth 3 (three) times daily.  . [DISCONTINUED] ciprofloxacin (CIPRO) 500 MG tablet Take 1 tablet (500 mg total) by mouth 2 (two) times daily.  . [DISCONTINUED] ezetimibe (ZETIA) 10 MG tablet Take 10 mg by mouth daily.       REVIEW OF SYSTEMS  : All other systems reviewed and negative except where noted in the History of Present Illness.   PHYSICAL EXAM: BP 140/80  Pulse 66  Ht 5' 3.5" (1.613 m)  Wt 165 lb 3.2 oz (74.934 kg)  BMI 28.80 kg/m2 General:   Chronically ill-appearing white female in no acute distress; non-toxic appearing Head: Normocephalic and atraumatic Eyes:  Sclerae anicteric, conjunctiva pink. Ears: Normal auditory acuity.  Lungs: Clear throughout to auscultation Heart: Regular rate and rhythm Abdomen: Soft, non-distended.  BS present.  LLQ TTP without R/R/G. Musculoskeletal: Symmetrical with no gross deformities  Skin: No lesions on visible extremities Extremities: No edema.  Right arm weakness. Psychological:  Alert and cooperative. Normal mood and affect  ASSESSMENT AND PLAN: -LLQ abdominal pain:  Had CT scan evidence of diverticulitis in December and was treated with 7 days of cipro and flagyl with minimal to no improvement of pain.  Recently was started on cipro again, but stopped taking after one day due to worsening diarrhea.  She has  several medications including antibiotics listed on her allergy list, many of which are just side effect intolerances, mostly diarrhea with antibiotics. She seems to be noncompliant at times. I have asked her to complete a repeat 14 day course of Cipro 500 mg twice a day and Flagyl 500 mg 3 times a day for treatment of festering diverticulitis. I told her that most antibiotics are going to cause diarrhea, but in order to get her to feel better if this is still diverticulitis then she needs to take antibiotics as prescribed. She agreed to do this. I will bring her back to see me again in approximately 10 days and if she is not having any improvement in her symptoms by that point then I would consider repeating CT scan of the abdomen and pelvis. In the interim she was advised to go to the emergency department if her symptoms were to worsen. -Diarrhea:  This has been present since September. She started on metformin around the same time. Her metformin is currently on hold for 7 days, however, now with being on antibiotics she will likely continue to have diarrhea until completing those.  We will  check stool studies for C. difficile, culture for enteric pathogens, and ova and parasites.

## 2013-08-26 NOTE — Telephone Encounter (Signed)
Message copied by Hulan Saas on Thu Aug 26, 2013  3:05 PM ------      Message from: Alonza Bogus D      Created: Thu Aug 26, 2013  2:52 PM      Regarding: Magnesium       I saw this patient earlier today. I did not realize that she had magnesium oxide 400 mg 3 times daily on her medication list. Will you please contact her and see if she is really taking this, if so, this will cause diarrhea and she should discontinue that if there is no other reason that she's taking it.            Thank you,            Jess ------

## 2013-08-26 NOTE — Patient Instructions (Signed)
Your prescriptions are being sent to your pharmacy Go to the basement for labs

## 2013-08-26 NOTE — Progress Notes (Signed)
Reviewed and agree with management. Javeria Briski D. Travez Stancil, M.D., FACG  

## 2013-08-26 NOTE — Telephone Encounter (Signed)
Spoke with patient and she states she is taking the Magnesium Oxide and it was give to her for constipation. She will stop this medication.

## 2013-08-27 ENCOUNTER — Other Ambulatory Visit: Payer: Medicare HMO

## 2013-08-27 DIAGNOSIS — R197 Diarrhea, unspecified: Secondary | ICD-10-CM

## 2013-08-30 LAB — CLOSTRIDIUM DIFFICILE BY PCR: Toxigenic C. Difficile by PCR: NOT DETECTED

## 2013-08-31 ENCOUNTER — Ambulatory Visit: Payer: Medicare Other | Admitting: Gastroenterology

## 2013-08-31 ENCOUNTER — Other Ambulatory Visit: Payer: Self-pay | Admitting: Emergency Medicine

## 2013-08-31 ENCOUNTER — Other Ambulatory Visit: Payer: Self-pay | Admitting: Physician Assistant

## 2013-08-31 LAB — OVA AND PARASITE SCREEN: OP: NONE SEEN

## 2013-08-31 LAB — STOOL CULTURE

## 2013-08-31 MED ORDER — LOSARTAN POTASSIUM 100 MG PO TABS: 100.0000 mg | ORAL_TABLET | Freq: Every day | ORAL | Status: DC

## 2013-09-07 ENCOUNTER — Ambulatory Visit: Payer: Medicare HMO | Admitting: Gastroenterology

## 2013-09-09 ENCOUNTER — Ambulatory Visit (INDEPENDENT_AMBULATORY_CARE_PROVIDER_SITE_OTHER): Payer: Medicare HMO | Admitting: Gastroenterology

## 2013-09-09 ENCOUNTER — Encounter: Payer: Self-pay | Admitting: Gastroenterology

## 2013-09-09 VITALS — BP 136/74 | HR 72 | Ht 63.5 in | Wt 165.0 lb

## 2013-09-09 DIAGNOSIS — K5730 Diverticulosis of large intestine without perforation or abscess without bleeding: Secondary | ICD-10-CM

## 2013-09-09 DIAGNOSIS — R1032 Left lower quadrant pain: Secondary | ICD-10-CM

## 2013-09-09 DIAGNOSIS — R109 Unspecified abdominal pain: Secondary | ICD-10-CM

## 2013-09-09 NOTE — Patient Instructions (Signed)
You have been scheduled for a CT scan of the abdomen and pelvis at [facility] ([address]---this is in the same building as [facility]).   You are scheduled on 09/16/2013 at 1pm. You should arrive 15 minutes prior to your appointment time for registration. Please follow the written instructions below on the day of your exam:  WARNING: IF YOU ARE ALLERGIC TO IODINE/X-RAY DYE, PLEASE NOTIFY RADIOLOGY IMMEDIATELY AT [phone]! YOU WILL BE GIVEN A 13 HOUR PREMEDICATION PREP.  1) Do not eat or drink anything after 9am (4 hours prior to your test) 2) You have been given 2 bottles of oral contrast to drink. The solution may taste               better if refrigerated, but do NOT add ice or any other liquid to this solution. Shake             well before drinking.    Drink 1 bottle of contrast @ 11am (2 hours prior to your exam)  Drink 1 bottle of contrast @ 12pm (1 hour prior to your exam)  You may take any medications as prescribed with a small amount of water except for the following: Metformin, Glucophage, Glucovance, Avandamet, Riomet, Fortamet, Actoplus Met, Janumet, Glumetza or Metaglip. The above medications must be held the day of the exam AND 48 hours after the exam.  The purpose of you drinking the oral contrast is to aid in the visualization of your intestinal tract. The contrast solution may cause some diarrhea. Before your exam is started, you will be given a small amount of fluid to drink. Depending on your individual set of symptoms, you may also receive an intravenous injection of x-ray contrast/dye. Plan on being at [facility] for 30 minutes or long, depending on the type of exam you are having performed.  This test typically takes 30-45 minutes to complete.  If you have any questions regarding your exam or if you need to reschedule, you may call the CT department at [phone] between the hours of 8:00 am and 5:00 pm,  Monday-Friday.  ________________________________________________________________________

## 2013-09-09 NOTE — Progress Notes (Signed)
09/09/2013 [name] [id number] 08-Oct-1940   History of Present Illness:  Patient is a 73 year old female who is known to Dr. Deatra Ina.  She is here today for 10 day follow-up of her LLQ abdominal pain and diarrhea.  Please see note from 08/26/2013 for details.  She was suspected to have ongoing diverticulitis and was placed on a repeat course of cipro and flagyl x 14 days.  She was also having diarrhea at that time.  She comes in today after almost completing the course of antibiotics and says that the diarrhea is better (only having one somewhat loose stool per day), but she continues to have LLQ abdominal pain.  The pain has improved to some degree, but is still present constantly and gets more severe at times.   Current Medications, Allergies, Past Medical History, Past Surgical History, Family History and Social History were reviewed in [employer] record.   Physical Exam: BP 136/74  Pulse 72  Ht 5' 3.5" (1.613 m)  Wt 165 lb (74.844 kg)  BMI 28.77 kg/m2 General:  Elderly white female in no acute distress Head: Normocephalic and atraumatic Eyes:  sclerae anicteric, conjunctiva pink  Ears: Normal auditory acuity Lungs: Clear throughout to auscultation Heart: Regular rate and rhythm. Abdomen: Soft, non-distended.  Normal bowel sounds.  LLQ TTP without R/R/G. Musculoskeletal: Symmetrical with no gross deformities  Extremities: No edema.  Right arm weakness. Psychological:  Alert and cooperative. Normal mood and affect  Assessment and Recommendations: -LLQ abdominal pain:  Still with pain after almost completing another 14 day course of cipro and flagyl; she will complete the remaining course.  Will repeat CT scan of the abdomen and pelvis with contrast to rule out continued/ongoing diverticulitis vs other pain-causing etiologies.  If CT scan negative for ongoing diverticulitis, then she may need colonoscopy (scheduled out about 6 weeks from now). -Diarrhea:  Much  improved.  ? If some of her loose stools is secondary to the metformin.

## 2013-09-10 NOTE — Progress Notes (Signed)
Reviewed and agree with management. Dea Bitting D. Branae Crail, M.D., FACG  

## 2013-09-16 ENCOUNTER — Inpatient Hospital Stay: Admission: RE | Admit: 2013-09-16 | Payer: Medicare HMO | Source: Ambulatory Visit

## 2013-09-20 ENCOUNTER — Ambulatory Visit (INDEPENDENT_AMBULATORY_CARE_PROVIDER_SITE_OTHER)
Admission: RE | Admit: 2013-09-20 | Discharge: 2013-09-20 | Disposition: A | Discharge status: Home / Self Care | Payer: Medicare HMO | Source: Ambulatory Visit | Attending: Gastroenterology | Admitting: Gastroenterology

## 2013-09-20 DIAGNOSIS — R109 Unspecified abdominal pain: Secondary | ICD-10-CM

## 2013-09-20 MED ORDER — IOHEXOL 300 MG/ML  SOLN
100.0000 mL | Freq: Once | INTRAMUSCULAR | Status: AC | PRN
  Administered 2013-09-20: 100 mL via INTRAVENOUS

## 2013-09-21 ENCOUNTER — Other Ambulatory Visit: Payer: Self-pay | Admitting: *Deleted

## 2013-09-21 DIAGNOSIS — N839 Noninflammatory disorder of ovary, fallopian tube and broad ligament, unspecified: Secondary | ICD-10-CM

## 2013-10-01 ENCOUNTER — Other Ambulatory Visit (INDEPENDENT_AMBULATORY_CARE_PROVIDER_SITE_OTHER): Payer: Medicare HMO

## 2013-10-01 ENCOUNTER — Ambulatory Visit (INDEPENDENT_AMBULATORY_CARE_PROVIDER_SITE_OTHER): Payer: Medicare HMO | Admitting: Gastroenterology

## 2013-10-01 ENCOUNTER — Encounter: Payer: Self-pay | Admitting: Gastroenterology

## 2013-10-01 VITALS — BP 140/70 | HR 72 | Ht 63.5 in | Wt 164.5 lb

## 2013-10-01 DIAGNOSIS — R197 Diarrhea, unspecified: Secondary | ICD-10-CM

## 2013-10-01 DIAGNOSIS — R1032 Left lower quadrant pain: Secondary | ICD-10-CM

## 2013-10-01 DIAGNOSIS — K7460 Unspecified cirrhosis of liver: Secondary | ICD-10-CM

## 2013-10-01 LAB — PROTIME-INR
INR: 1 ratio (ref 0.8–1.0)
PROTHROMBIN TIME: 10.4 s (ref 10.2–12.4)

## 2013-10-01 MED ORDER — NA SULFATE-K SULFATE-MG SULF 17.5-3.13-1.6 GM/177ML PO SOLN: 1.0000 | Freq: Once | ORAL | Status: DC

## 2013-10-01 NOTE — Assessment & Plan Note (Signed)
Pain has resolved and was probably 2 to low-grade diverticulitis.  Since it has been over 10 years since her last colonoscopy will proceed with followup exam.  I have recommended that this be done prior to her oophorectomy.

## 2013-10-01 NOTE — Assessment & Plan Note (Signed)
CT scan suggests cirrhosis as evidenced by an irregular liver contour.  Suspect [facility]  Recommendations #1 serologies for hepatitis A., B. and C. #2 upper endoscopy to evaluate for varices

## 2013-10-01 NOTE — Progress Notes (Signed)
          History of Present Illness:  [name] has returned for followup of abdominal pain.  Left lower quadrant pain is gone as well his diarrhea.  Followup CT scan demonstrated a right ovarian cyst.  No further inflammatory changes were seen in the colon.  Both hepatic steatosis and some liver nodularity were noted in both CT scans from December and March, raising the question of early cirrhosis.  She has mild suprapubic discomfort and is scheduled for an oophorectomy because of a large ovarian cyst.  She was told that the cyst appeared benign.  There is no history of liver disease, alcoholism or hepatitis.     Review of Systems: Pertinent positive and negative review of systems were noted in the above HPI section. All other review of systems were otherwise negative.    Current Medications, Allergies, Past Medical History, Past Surgical History, Family History and Social History were reviewed in [facility] record  Vital signs were reviewed in today's medical record. Physical Exam: General: Well developed , well nourished, no acute distress Abdomen is without masses, tenderness organomegaly   See Assessment and Plan under Problem List

## 2013-10-01 NOTE — Assessment & Plan Note (Signed)
Clinically resolved.  

## 2013-10-01 NOTE — Patient Instructions (Addendum)
You have been given a separate informational sheet regarding your tobacco use, the importance of quitting and local resources to help you quit.   You have been scheduled for an endoscopy and colonoscopy with propofol. Please follow the written instructions given to you at your visit today. Please pick up your prep at the pharmacy within the next 1-3 days. If you use inhalers (even only as needed), please bring them with you on the day of your procedure. Your physician has requested that you go to www.startemmi.com and enter the access code given to you at your visit today. This web site gives a general overview about your procedure. However, you should still follow specific instructions given to you by our office regarding your preparation for the procedure.  Go to the basement today for labs

## 2013-10-02 LAB — HEPATITIS C ANTIBODY: HCV Ab: NEGATIVE

## 2013-10-02 LAB — HEPATITIS A ANTIBODY, TOTAL: Hep A Total Ab: REACTIVE — AB

## 2013-10-02 LAB — HEPATITIS B SURFACE ANTIGEN: Hepatitis B Surface Ag: NEGATIVE

## 2013-10-02 LAB — HEPATITIS B SURFACE ANTIBODY,QUALITATIVE: Hep B S Ab: NEGATIVE

## 2013-10-05 ENCOUNTER — Ambulatory Visit: Payer: Medicare Other | Admitting: Internal Medicine

## 2013-10-08 ENCOUNTER — Encounter: Payer: Self-pay | Admitting: Gastroenterology

## 2013-10-11 ENCOUNTER — Encounter: Payer: Self-pay | Admitting: Gynecology

## 2013-10-11 ENCOUNTER — Ambulatory Visit: Payer: Medicare HMO | Attending: Gynecology | Admitting: Gynecology

## 2013-10-11 VITALS — BP 133/64 | HR 66 | Temp 98.5°F | Resp 18 | Ht 63.47 in | Wt 165.0 lb

## 2013-10-11 DIAGNOSIS — Z9010 Acquired absence of unspecified breast and nipple: Secondary | ICD-10-CM | POA: Insufficient documentation

## 2013-10-11 DIAGNOSIS — J449 Chronic obstructive pulmonary disease, unspecified: Secondary | ICD-10-CM | POA: Insufficient documentation

## 2013-10-11 DIAGNOSIS — I10 Essential (primary) hypertension: Secondary | ICD-10-CM | POA: Insufficient documentation

## 2013-10-11 DIAGNOSIS — M1990 Unspecified osteoarthritis, unspecified site: Secondary | ICD-10-CM | POA: Insufficient documentation

## 2013-10-11 DIAGNOSIS — Z881 Allergy status to other antibiotic agents status: Secondary | ICD-10-CM | POA: Insufficient documentation

## 2013-10-11 DIAGNOSIS — Z853 Personal history of malignant neoplasm of breast: Secondary | ICD-10-CM | POA: Insufficient documentation

## 2013-10-11 DIAGNOSIS — J4489 Other specified chronic obstructive pulmonary disease: Secondary | ICD-10-CM | POA: Insufficient documentation

## 2013-10-11 DIAGNOSIS — F17200 Nicotine dependence, unspecified, uncomplicated: Secondary | ICD-10-CM | POA: Insufficient documentation

## 2013-10-11 DIAGNOSIS — Z90710 Acquired absence of both cervix and uterus: Secondary | ICD-10-CM | POA: Insufficient documentation

## 2013-10-11 DIAGNOSIS — E785 Hyperlipidemia, unspecified: Secondary | ICD-10-CM | POA: Insufficient documentation

## 2013-10-11 DIAGNOSIS — Z885 Allergy status to narcotic agent status: Secondary | ICD-10-CM | POA: Insufficient documentation

## 2013-10-11 DIAGNOSIS — Z880 Allergy status to penicillin: Secondary | ICD-10-CM | POA: Insufficient documentation

## 2013-10-11 DIAGNOSIS — Z8673 Personal history of transient ischemic attack (TIA), and cerebral infarction without residual deficits: Secondary | ICD-10-CM | POA: Insufficient documentation

## 2013-10-11 DIAGNOSIS — R1031 Right lower quadrant pain: Secondary | ICD-10-CM | POA: Insufficient documentation

## 2013-10-11 DIAGNOSIS — I700 Atherosclerosis of aorta: Secondary | ICD-10-CM | POA: Insufficient documentation

## 2013-10-11 DIAGNOSIS — N83209 Unspecified ovarian cyst, unspecified side: Secondary | ICD-10-CM | POA: Insufficient documentation

## 2013-10-11 DIAGNOSIS — Z888 Allergy status to other drugs, medicaments and biological substances status: Secondary | ICD-10-CM | POA: Insufficient documentation

## 2013-10-11 DIAGNOSIS — R1032 Left lower quadrant pain: Secondary | ICD-10-CM | POA: Insufficient documentation

## 2013-10-11 DIAGNOSIS — I2510 Atherosclerotic heart disease of native coronary artery without angina pectoris: Secondary | ICD-10-CM | POA: Insufficient documentation

## 2013-10-11 DIAGNOSIS — Z79899 Other long term (current) drug therapy: Secondary | ICD-10-CM | POA: Insufficient documentation

## 2013-10-11 NOTE — Patient Instructions (Signed)
Follow up with Dr. Gaetano Net

## 2013-10-11 NOTE — Progress Notes (Signed)
Consult Note: Gyn-Onc   [name] 73 y.o. female  No chief complaint on file.   Assessment : Ovarian cyst with thin septation and normal CA 125. 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  Plan: Total the patient and her husband that I believe this is a benign ovarian cyst. Given her pain she would like to have it removed. I will ask her to return to the care of Dr. Tomblin to schedule surgery. I think it would be reasonable (if technically feasible) to perform the surgery laparoscopically. I also indicated that removal of both ovaries if possible would be my advice. All other questions are answered.    HPI: 73-year-old white female seen in consultation request of Dr. Tomblin regarding management of a ovarian cyst. The ovarian cyst was first recognized on a CT scan in December which was performed for evaluation of diverticulitis. Cyst at that time measured 2.8 cm. A followup CT scan on 09/20/2013 showed the cyst now measured 3.5 cm. The patient also admits to having right lower quadrant and left lower quadrant pain (right greater than left and is for several months. An ultrasound was then obtained showing the right ovary measures 4.5 x 3.1 cm with thin septations and no blood flow. CA 125 value is 6.9 units per mL. Patient previously had a hysterectomy the left ovary cannot be visualized on ultrasound.  Obstetrical history gravida 2  Review of Systems:10 point review of systems is negative except as noted in interval history.   Vitals: Blood pressure 133/64, pulse 66, temperature 98.5 F (36.9 C), temperature source Oral, resp. rate 18, height 5' 3.47" (1.612 m), weight 165 lb (74.844 kg).  Physical Exam: General : The patient is a healthy woman in no acute distress.  HEENT: normocephalic, extraoccular movements normal; neck is supple without thyromegally  Lynphnodes: Supraclavicular and inguinal nodes not enlarged  Abdomen: Soft, minimal tenderness in both lower quadrants, no  ascites, no organomegally, no masses, no hernias  Pelvic:  EGBUS: Normal female  Vagina: Normal, no lesions  Urethra and Bladder: Normal, non-tender  Cervix: Surgically absent  Uterus: Surgically absent  Bi-manual examination: Mild tenderness in the right adnexal region.; no adenxal masses or nodularity  Rectal: normal sphincter tone, no masses, no blood  Lower extremities: No edema or varicosities. Normal range of motion      Allergies  Allergen Reactions  . Ace Inhibitors   . Acyclovir   . Amoxil [Amoxicillin] Diarrhea  . Bisoprolol     Decreased Heart Rate  . Ciprofloxacin Diarrhea  . Crestor [Rosuvastatin]     Elevated LFT's and Myalgias  . Darvocet [Propoxyphene N-Acetaminophen]     Bad Dreams  . Doxycycline Diarrhea  . Enalapril Cough  . Lipitor [Atorvastatin]     Elevated LFT's and Myalgias  . Metformin And Related Diarrhea  . Prednisone     Intolerant to High Dose Prednisone  . Vicodin [Hydrocodone-Acetaminophen]     Increased Anxiety and Nervousness  . Acyclovir And Related Rash    Past Medical History  Diagnosis Date  . CVA (cerebral vascular accident) 1985  . Tobacco abuse   . Allergic rhinitis   . Hypertension   . Hyperlipidemia   . Elevated hemoglobin A1c measurement   . Breast cancer     right  . COPD (chronic obstructive pulmonary disease)   . Lymphocytosis   . DJD (degenerative joint disease)   . ASCVD (arteriosclerotic cardiovascular disease)   . CVA (cerebral vascular accident)   .   Atherosclerosis of aorta 2014    via CT AB/Pelvis- no AAA  . Ovarian cyst     Past Surgical History  Procedure Laterality Date  . Back surgery  2005  . Mastectomy  1990    Right, no adjuvant  . Cholecystectomy  2006  . Abdominal hysterectomy  1976  . Cholecystectomy N/A 2008    Dr. Davis  . Excision of benign salivary tumor Left 1968  . Shoulder surgery Left 2007    Dr. Murphy    Current Outpatient Prescriptions  Medication Sig Dispense Refill  .  acetaminophen-codeine (TYLENOL #3) 300-30 MG per tablet Take 1-2 tablets by mouth every 6 (six) hours as needed.  60 tablet  0  . aspirin 81 MG tablet Take 81 mg by mouth daily.        . Cholecalciferol (VITAMIN D3) 2000 UNITS TABS Take 3 tablets by mouth daily. 6000u per day      . fenofibrate micronized (LOFIBRA) 134 MG capsule TAKE ONE CAPSULE BY MOUTH ONCE DAILY  90 capsule  1  . fish oil-omega-3 fatty acids 1000 MG capsule Take 1 capsule by mouth 3 (three) times daily.       . Flaxseed, Linseed, (FLAX SEED OIL) 1000 MG CAPS Take 1 capsule by mouth 3 (three) times daily.        . fluticasone (FLONASE) 50 MCG/ACT nasal spray Place 2 sprays into the nose daily. At bedtime  16 g  prn  . furosemide (LASIX) 40 MG tablet Take 40 mg by mouth. Takes 1/2 tablet daily by mouth      . losartan (COZAAR) 100 MG tablet Take 1 tablet (100 mg total) by mouth daily.  30 tablet  3  . Na Sulfate-K Sulfate-Mg Sulf (SUPREP BOWEL PREP) SOLN Take 1 kit by mouth once.  1 Bottle  0  . tiotropium (SPIRIVA) 18 MCG inhalation capsule Place 1 capsule (18 mcg total) into inhaler and inhale daily.  30 capsule  prn  . albuterol (VENTOLIN HFA) 108 (90 BASE) MCG/ACT inhaler Inhale 2 puffs into the lungs every 6 (six) hours as needed for wheezing or shortness of breath.  1 Inhaler  prn   No current facility-administered medications for this visit.    History   Social History  . Marital Status: Married    Spouse Name: N/A    Number of Children: 2  . Years of Education: N/A   Occupational History  . retired    Social History Main Topics  . Smoking status: Current Every Day Smoker -- 0.50 packs/day for 55 years    Types: Cigarettes  . Smokeless tobacco: Never Used     Comment: Electronic cig as well  . Alcohol Use: No  . Drug Use: No  . Sexual Activity: Not on file   Other Topics Concern  . Not on file   Social History Narrative   Married with children.   Smokes 1/2 ppd currently.  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 Emphysema Brother   . Emphysema Brother   . Emphysema Sister   . Allergies Brother     all brothers and sisters  . Asthma Sister   . Asthma Sister   . Heart disease Mother   . Heart disease Sister   . Heart disease Brother   . Cancer Mother     breast  . Cancer Sister       breast  . Colon cancer Neg Hx       CLARKE-PEARSON,Mistie Adney L, MD 10/11/2013, 12:50 PM

## 2013-10-19 ENCOUNTER — Encounter: Payer: Self-pay | Admitting: Gastroenterology

## 2013-10-19 ENCOUNTER — Ambulatory Visit (AMBULATORY_SURGERY_CENTER): Payer: Medicare HMO | Admitting: Gastroenterology

## 2013-10-19 VITALS — BP 135/83 | HR 62 | Temp 100.0°F | Resp 21 | Ht 63.5 in | Wt 164.0 lb

## 2013-10-19 DIAGNOSIS — K7460 Unspecified cirrhosis of liver: Secondary | ICD-10-CM

## 2013-10-19 DIAGNOSIS — R197 Diarrhea, unspecified: Secondary | ICD-10-CM

## 2013-10-19 DIAGNOSIS — K5730 Diverticulosis of large intestine without perforation or abscess without bleeding: Secondary | ICD-10-CM

## 2013-10-19 DIAGNOSIS — R1032 Left lower quadrant pain: Secondary | ICD-10-CM

## 2013-10-19 LAB — GLUCOSE, CAPILLARY: Glucose-Capillary: 114 mg/dL — ABNORMAL HIGH (ref 70–99)

## 2013-10-19 MED ORDER — SODIUM CHLORIDE 0.9 % IV SOLN: 500.0000 mL | INTRAVENOUS | Status: DC

## 2013-10-19 MED ORDER — HYOSCYAMINE SULFATE ER 0.375 MG PO TBCR: EXTENDED_RELEASE_TABLET | ORAL | Status: DC

## 2013-10-19 NOTE — Patient Instructions (Signed)
YOU HAD AN ENDOSCOPIC PROCEDURE TODAY AT THE [facility]: Refer to the procedure report that was given to you for any specific questions about what was found during the examination.  If the procedure report does not answer your questions, please call your gastroenterologist to clarify.  If you requested that your care partner not be given the details of your procedure findings, then the procedure report has been included in a sealed envelope for you to review at your convenience later.  YOU SHOULD EXPECT: Some feelings of bloating in the abdomen. Passage of more gas than usual.  Walking can help get rid of the air that was put into your GI tract during the procedure and reduce the bloating. If you had a lower endoscopy (such as a colonoscopy or flexible sigmoidoscopy) you may notice spotting of blood in your stool or on the toilet paper. If you underwent a bowel prep for your procedure, then you may not have a normal bowel movement for a few days.  DIET: Your first meal following the procedure should be a light meal and then it is ok to progress to your normal diet.  A half-sandwich or bowl of soup is an example of a good first meal.  Heavy or fried foods are harder to digest and may make you feel nauseous or bloated.  Likewise meals heavy in dairy and vegetables can cause extra gas to form and this can also increase the bloating.  Drink plenty of fluids but you should avoid alcoholic beverages for 24 hours.  ACTIVITY: Your care partner should take you home directly after the procedure.  You should plan to take it easy, moving slowly for the rest of the day.  You can resume normal activity the day after the procedure however you should NOT DRIVE or use heavy machinery for 24 hours (because of the sedation medicines used during the test).    SYMPTOMS TO REPORT IMMEDIATELY: A gastroenterologist can be reached at any hour.  During normal business hours, 8:30 AM to 5:00 PM Monday through Friday,  call [phone].  After hours and on weekends, please call the GI answering service at [phone] who will take a message and have the physician on call contact you.   Following lower endoscopy (colonoscopy or flexible sigmoidoscopy):  Excessive amounts of blood in the stool  Significant tenderness or worsening of abdominal pains  Swelling of the abdomen that is new, acute  Fever of 100F or higher  Following upper endoscopy (EGD)  Vomiting of blood or coffee ground material  New chest pain or pain under the shoulder blades  Painful or persistently difficult swallowing  New shortness of breath  Fever of 100F or higher  Black, tarry-looking stools  FOLLOW UP: If any biopsies were taken you will be contacted by phone or by letter within the next 1-3 weeks.  Call your gastroenterologist if you have not heard about the biopsies in 3 weeks.  Our staff will call the home number listed on your records the next business day following your procedure to check on you and address any questions or concerns that you may have at that time regarding the information given to you following your procedure. This is a courtesy call and so if there is no answer at the home number and we have not heard from you through the emergency physician on call, we will assume that you have returned to your regular daily activities without incident.  SIGNATURES/CONFIDENTIALITY: You and/or your care   partner have signed paperwork which will be entered into your electronic medical record.  These signatures attest to the fact that that the information above on your After Visit Summary has been reviewed and is understood.  Full responsibility of the confidentiality of this discharge information lies with you and/or your care-partner.   Handouts were given to your care partner on diverticulosis and a high fiber diet with liberal fluid intake. You may resume your current medications today. Please call if any questions or  concerns.

## 2013-10-19 NOTE — Progress Notes (Signed)
No complaints noted in the recovery room. Maw   

## 2013-10-19 NOTE — Op Note (Signed)
[facility]  [address] [place], [id number]   COLONOSCOPY PROCEDURE REPORT  PATIENT: [name], [name].  MR#: [id number] BIRTHDATE: 06-01-1941 , 72  yrs. old GENDER: Female ENDOSCOPIST: Inda Castle, MD REFERRED OI:[id number] Melford Aase, M.D. PROCEDURE DATE:  10/19/2013 PROCEDURE:   Colonoscopy, diagnostic First Screening Colonoscopy - Avg.  risk and is 50 yrs.  old or older - No.  Prior Negative Screening - Now for repeat screening. 10 or more years since last screening  History of Adenoma - Now for follow-up colonoscopy & has been > or = to 3 yrs.  N/A  Polyps Removed Today? No.  Recommend repeat exam, <10 yrs? No. ASA CLASS:   Class II INDICATIONS: history of recurrent diverticulitis.  Left lower quadrant pain MEDICATIONS: MAC sedation, administered by CRNA and Propofol (Diprivan) 240 mg IV  DESCRIPTION OF PROCEDURE:   After the risks benefits and alternatives of the procedure were thoroughly explained, informed consent was obtained.  A digital rectal exam revealed no abnormalities of the rectum.   The LB [id number]  endoscope was introduced through the anus and advanced to the cecum, which was identified by both the appendix and ileocecal valve. No adverse events experienced.   The quality of the prep was Suprep good  The instrument was then slowly withdrawn as the colon was fully examined.      COLON FINDINGS: There was moderate diverticulosis noted in the sigmoid colon and descending colon with associated muscular hypertrophy, luminal narrowing and colonic spasm.   The colon was otherwise normal.  There was no diverticulosis, inflammation, polyps or cancers unless previously stated.  Retroflexed views revealed no abnormalities. The time to cecum=4 minutes 28 seconds. Withdrawal time=8 minutes 17 seconds.  The scope was withdrawn and the procedure completed. COMPLICATIONS: There were no complications.  ENDOSCOPIC IMPRESSION: 1.   There was  moderate diverticulosis noted in the sigmoid colon and descending colon 2.   The colon was otherwise normal  RECOMMENDATIONS: anticholinergics as needed for abdominal pain  eSigned:  Inda Castle, MD 10/19/2013 4:56 PM   cc:

## 2013-10-19 NOTE — Op Note (Signed)
[place]  [address]. [place], [id number]   ENDOSCOPY PROCEDURE REPORT  PATIENT: [name], [name].  MR#: [id number] BIRTHDATE: 13-Aug-1940 , 72  yrs. old GENDER: Female ENDOSCOPIST: Inda Castle, MD REFERRED BY:  [name], M.D. PROCEDURE DATE:  10/19/2013 PROCEDURE:  EGD, diagnostic ASA CLASS:     Class II INDICATIONS:  Screening for varices.  cryptogenic MEDICATIONS: There was residual sedation effect present from prior procedure, MAC sedation, administered by CRNA, Propofol (Diprivan), Propofol (Diprivan) 60 mg IV, and Simethicone 0.6cc PO TOPICAL ANESTHETIC: Cetacaine Spray  DESCRIPTION OF PROCEDURE: After the risks benefits and alternatives of the procedure were thoroughly explained, informed consent was obtained.  The LB [id number] endoscope was introduced through the mouth and advanced to the third portion of the duodenum. Without limitations.  The instrument was slowly withdrawn as the mucosa was fully examined.      The upper, middle and distal third of the esophagus were carefully inspected and no abnormalities were noted.  The z-line was well seen at the GEJ.  The endoscope was pushed into the fundus which was normal including a retroflexed view.  The antrum, gastric body, first and second part of the duodenum were unremarkable. Retroflexed views revealed no abnormalities.     The scope was then withdrawn from the patient and the procedure completed.  COMPLICATIONS: There were no complications. ENDOSCOPIC IMPRESSION: Normal EGD  RECOMMENDATIONS: Endoscopy 1 year  REPEAT EXAM:  eSigned:  Inda Castle, MD 10/19/2013 4:59 PM   CC:

## 2013-10-19 NOTE — Progress Notes (Signed)
Report to pacu rn, vss, bbs=clear 

## 2013-10-20 ENCOUNTER — Telehealth: Payer: Self-pay | Admitting: *Deleted

## 2013-10-20 NOTE — Telephone Encounter (Signed)
  Follow up Call-  Call back number 10/19/2013  Post procedure Call Back phone  # [phone]  Permission to leave phone message Yes   spoke with husband; pt was asleep  Patient questions:  Do you have a fever, pain , or abdominal swelling? no Pain Score  0 *  Have you tolerated food without any problems? yes  Have you been able to return to your normal activities? yes  Do you have any questions about your discharge instructions: Diet   no Medications  no Follow up visit  no  Do you have questions or concerns about your Care? no  Actions: * If pain score is 4 or above: No action needed, pain <4.

## 2013-11-04 ENCOUNTER — Ambulatory Visit (INDEPENDENT_AMBULATORY_CARE_PROVIDER_SITE_OTHER): Payer: Medicare HMO | Admitting: Internal Medicine

## 2013-11-04 ENCOUNTER — Encounter: Payer: Self-pay | Admitting: Internal Medicine

## 2013-11-04 VITALS — BP 122/80 | HR 76 | Ht 64.0 in | Wt 164.8 lb

## 2013-11-04 DIAGNOSIS — J302 Other seasonal allergic rhinitis: Secondary | ICD-10-CM

## 2013-11-04 DIAGNOSIS — J3089 Other allergic rhinitis: Principal | ICD-10-CM

## 2013-11-04 DIAGNOSIS — J309 Allergic rhinitis, unspecified: Secondary | ICD-10-CM

## 2013-11-04 DIAGNOSIS — F17200 Nicotine dependence, unspecified, uncomplicated: Secondary | ICD-10-CM

## 2013-11-04 MED ORDER — METHYLPREDNISOLONE ACETATE 80 MG/ML IJ SUSP
80.0000 mg | Freq: Once | INTRAMUSCULAR | Status: AC
  Administered 2013-11-04: 80 mg via INTRAMUSCULAR

## 2013-11-04 MED ORDER — AZITHROMYCIN 250 MG PO TABS
ORAL_TABLET | ORAL | Status: DC
Start: 2013-11-04 — End: 2013-11-16

## 2013-11-04 MED ORDER — PHENYLEPHRINE HCL 1 % NA SOLN
3.0000 [drp] | Freq: Once | NASAL | Status: AC
  Administered 2013-11-04: 3 [drp] via NASAL

## 2013-11-04 NOTE — Patient Instructions (Addendum)
Neb neo nasal  Depo 80  Script Zpak sent to drug store  Please keep trying to cut down the smoking  Claritin/ loratadine, and Allegra/ fexofenadine are otc antihistamines that are less likely to make you sleepy

## 2013-11-04 NOTE — Progress Notes (Signed)
Patient ID: [name], female    DOB: November 18, 1940, 73 y.o.   MRN: [id number]  HPI 73 yoF 1/2 PPD smoker returning for f/u of COPD and allergic rhinitis. PFT 2011 showed mild obstruction w/ severe reduction DLCO. Wears elastic sleeve right arm since remote mastectomy. Lat here September 23,2011. She and her husband shared a chest cold 1 month ago, resolved back to baseline w/o presciption med. She admits frequent wheeze. Gets short of breath on long walks, housework.  Uses Albuterol about 3 x/ daily, but hasn't used Advair saying "I didn't know what it was for". Little change day to day or since last here.  Having Spring seasonal nasal congestion and blowing for the past month. Eyes itch. Tried benadryl, but made her too sleepy.  Denies chest pains, blood or nodes. Occasional palpitation.  She would like to stop smoking, but hasn't tried even patches.   05/23/11- 73 yoF 1/2 PPD smoker returning for f/u of COPD and allergic rhinitis, complicated by history of CVA, breast cancer. She is trying to quit smoking. Now down to 2 or 3 cigarettes a day using an electronic cigarette instead and tried to taper the strength of that. She thinks her breathing is better. Only occasional albuterol, less than once per week. Had flu and pneumococcal vaccines and a TB skin test negative, this year. Right-sided chest and nose both seem the most congested. Occasionally, cough or blow out some phlegm. She denies blood or chest pain. CT chest 12/14/2009 showed changes of COPD, cardiac enlargement, no PE.  12/30/11- 73 yoF 1/2 PPD smoker returning for f/u of COPD and allergic rhinitis, complicated by history of CVA, breast cancer C/O cough with white mucus and wheezing since 5/29. Denies sob, chest pain, and chest tightness. Still smoking 5 or 6 cigarettes a day despite repeated counseling. After the winter comfortably. Blames the spring allergy for some nasal congestion. Saw her primary physician in May for bronchitis.  Doxycycline caused diarrhea. She completed prednisone taper. Now still has productive cough with white phlegm. Denies fever or adenopathy.  CXR 05/29/11- IMPRESSION:  Stable chest x-ray. No active lung disease.  Original Report Authenticated By: Joretta Bachelor, M.D.    08/03/12- 73 yoF 1/2 PPD smoker returning for f/u of COPD and allergic rhinitis, complicated by history of CVA, breast cancer/ lymphedema FOLLOWS FOR: had been doing good with breathing until today; started coughing-productive-yellow in color; SOB and wheezing today as well. No effort to stop smoking despite her discussions. Reports doing very well until the last day or so-new onset of postnasal drip, cough, scant yellow sputum without fever swollen glands or sore throat. Using her Spiriva about 3 times per week and lost her rescue inhaler. CXR 05/19/12 IMPRESSION:  Stable chest x-ray. No active lung disease. Prior right  mastectomy.  Original Report Authenticated By: Joretta Bachelor, M.D.   04/06/13- 73 yoF 1/2 PPD smoker returning for f/u of COPD and allergic rhinitis, complicated by history of CVA, breast cancer/ lymphedema FOLLOWS FOR:sob-same,cough-yellow,wheezing,denies cp and tightness No effort to stop smoking. Some sinus congestion. Rescue inhaler < 1x/ day.  11/04/13- 73 yoF 1/2 PPD smoker returning for f/u of COPD and allergic rhinitis, complicated by history of CVA, breast cancer/ lymphedema FOLLOWS FOR: breathing is fine but having flare up of allergies;cough-productive(yellow to green in color), Wheezing as well. Using E-cig 6 mg, not tobacco cigs now. Notices nasal congestion CXR 04/06/13 IMPRESSION:  No active cardiopulmonary disease.  Electronically Signed  By: Rolm Baptise  M.D.  On: 04/06/2013 14:49  Review of Systems-See HPI Constitutional:   No-   weight loss, night sweats, fevers, chills, fatigue, lassitude. HEENT:   No-  headaches, difficulty swallowing, tooth/dental problems, sore throat,       No-   sneezing, itching, ear ache, +nasal congestion, +post nasal drip,  CV:  No-   chest pain, orthopnea, PND, swelling in lower extremities, anasarca, dizziness, palpitations Resp: No- acute  shortness of breath with exertion or at rest.              No- productive cough,  + non-productive cough,  No- coughing up of blood.             No-change in color of mucus.  No- wheezing.   Skin: No-   rash or lesions. GI:  No-   heartburn, indigestion, abdominal pain, nausea, vomiting, GU: . MS:  No-   joint pain or swelling.   Neuro-     nothing unusual Psych:  No- change in mood or affect. No depression or anxiety.  No memory loss.  Objective:   Physical Exam  General- Alert, Oriented, Affect-appropriate, Distress- none acute, +odor of tobacco Skin- rash-none, lesions- none, excoriation- none Lymphadenopathy- none Head- atraumatic            Eyes- Gross vision intact, PERRLA, conjunctivae clear secretions            Ears- Hearing, canals-normal            Nose- +turbinate edema, no-Septal dev, mucus, polyps, erosion, perforation             Throat- Mallampati III , mucosa clear , drainage- none, tonsils- atrophic, dentures Neck- flexible , trachea midline, no stridor , thyroid nl, carotid no bruit Chest - symmetrical excursion , unlabored           Heart/CV- RRR , no murmur , no gallop  , no rub, nl s1 s2                           - JVD- none , edema- none, stasis changes- none, varices- none           Lung- +diminished breath sounds, wheeze+, unlabored, +Loose cough ,                                                dullness-none, rub- none           Chest wall-  Abd-  Br/ Gen/ Rectal- Not done, not indicated Extrem- cyanosis- none, clubbing, none, atrophy- none, strength- nl. +Right arm remains in the elastic sleeve. Neuro- grossly intact to observation

## 2013-11-16 ENCOUNTER — Encounter (HOSPITAL_COMMUNITY): Payer: Self-pay | Admitting: Pharmacist

## 2013-11-23 ENCOUNTER — Ambulatory Visit (INDEPENDENT_AMBULATORY_CARE_PROVIDER_SITE_OTHER): Payer: Medicare HMO | Admitting: Internal Medicine

## 2013-11-23 ENCOUNTER — Encounter: Payer: Self-pay | Admitting: Internal Medicine

## 2013-11-23 VITALS — BP 126/78 | HR 64 | Temp 97.5°F | Resp 16 | Ht 63.5 in | Wt 160.0 lb

## 2013-11-23 DIAGNOSIS — I10 Essential (primary) hypertension: Secondary | ICD-10-CM

## 2013-11-23 DIAGNOSIS — Z79899 Other long term (current) drug therapy: Secondary | ICD-10-CM | POA: Insufficient documentation

## 2013-11-23 DIAGNOSIS — E559 Vitamin D deficiency, unspecified: Secondary | ICD-10-CM | POA: Insufficient documentation

## 2013-11-23 DIAGNOSIS — R7309 Other abnormal glucose: Secondary | ICD-10-CM

## 2013-11-23 DIAGNOSIS — E782 Mixed hyperlipidemia: Secondary | ICD-10-CM

## 2013-11-23 LAB — CBC WITH DIFFERENTIAL/PLATELET
Basophils Absolute: 0 10*3/uL (ref 0.0–0.1)
Basophils Relative: 0 % (ref 0–1)
EOS ABS: 0.3 10*3/uL (ref 0.0–0.7)
EOS PCT: 3 % (ref 0–5)
HEMATOCRIT: 40.2 % (ref 36.0–46.0)
HEMOGLOBIN: 14.2 g/dL (ref 12.0–15.0)
LYMPHS ABS: 5.7 10*3/uL — AB (ref 0.7–4.0)
Lymphocytes Relative: 52 % — ABNORMAL HIGH (ref 12–46)
MCH: 32.6 pg (ref 26.0–34.0)
MCHC: 35.3 g/dL (ref 30.0–36.0)
MCV: 92.4 fL (ref 78.0–100.0)
MONOS PCT: 7 % (ref 3–12)
Monocytes Absolute: 0.8 10*3/uL (ref 0.1–1.0)
Neutro Abs: 4.1 10*3/uL (ref 1.7–7.7)
Neutrophils Relative %: 38 % — ABNORMAL LOW (ref 43–77)
Platelets: 331 10*3/uL (ref 150–400)
RBC: 4.35 MIL/uL (ref 3.87–5.11)
RDW: 13.1 % (ref 11.5–15.5)
WBC: 10.9 10*3/uL — ABNORMAL HIGH (ref 4.0–10.5)

## 2013-11-23 NOTE — Progress Notes (Signed)
Patient ID: [name], female   DOB: November 15, 1940, 73 y.o.   MRN: [id number]    This very nice 73 y.o. MWF presents for 3 month follow up with Hypertension, Hyperlipidemia, Pre-Diabetes and Vitamin D Deficiency. Patient relates she's scheduled for day surgery on May 12 by Dr Gertie Fey for a Rt ovarian cyst. She also reports recent Negative EGD & Colon in Jan by Dr Deatra Ina.   HTN predates since 34. 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  BP has been controlled at home. Today's BP: 126/78 mmHg. Patient denies any cardiac type chest pain, palpitations, dyspnea/orthopnea/PND, dizziness, claudication, or dependent edema.            Hyperlipidemia is controlled with diet & meds. Last chol in Feb 2015 was at goal.  Patient denies myalgias or other med SE's.  Lab Results  Component Value Date   CHOL 190 08/24/2013   HDL 42 08/24/2013   LDLCALC 74 08/24/2013   TRIG 372* 08/24/2013   CHOLHDL 4.5 08/24/2013    Also, the patient has history of PreDiabetes A1c 5.9% in July 2010 and 6.3% in July 2013 and July 2014. Patient denies any symptoms of reactive hypoglycemia, diabetic polys, paresthesias or visual blurring.   Further, Patient has history of Vitamin D Deficiency of 12 in 2008 and last vitamin D was 50 in July 2014. Patient supplements vitamin D without any suspected side-effects.   Medication List       albuterol 108 (90 BASE) MCG/ACT inhaler  Commonly known as:  PROVENTIL HFA;VENTOLIN HFA  Inhale 2 puffs into the lungs every 6 (six) hours as needed for wheezing or shortness of breath.     aspirin 81 MG tablet  Take 81 mg by mouth daily.     fenofibrate micronized 134 MG capsule  Commonly known as:  LOFIBRA  TAKE ONE CAPSULE BY MOUTH ONCE DAILY     fish oil-omega-3 fatty acids 1000 MG capsule  Take 1 capsule by mouth 3 (three) times daily.     Flax Seed Oil 1000 MG Caps  Take 1 capsule by mouth 3 (three) times daily.     fluticasone 50 MCG/ACT nasal spray  Commonly  known as:  FLONASE  Place 2 sprays into both nostrils daily as needed for allergies or rhinitis.     furosemide 40 MG tablet  Commonly known as:  LASIX  Take 20 mg by mouth 3 (three) times a week. Mon, Wed, Fri     Hyoscyamine Sulfate 0.375 MG Tbcr  Take one tablet twice a day as needed abdominal pain     losartan 100 MG tablet  Commonly known as:  COZAAR  Take 50 mg by mouth daily.     Magnesium 500 MG Tabs  Take 1 tablet by mouth 2 (two) times daily.     tiotropium 18 MCG inhalation capsule  Commonly known as:  SPIRIVA  Place 18 mcg into inhaler and inhale daily as needed.     Vitamin D3 2000 UNITS Tabs  Take 3 tablets by mouth daily. 6000u per day       Allergies  Allergen Reactions  . Ace Inhibitors Cough  . Amoxil [Amoxicillin] Diarrhea  . Bisoprolol     Decreased Heart Rate  . Ciprofloxacin Diarrhea  . Crestor [Rosuvastatin]     Elevated LFT's and Myalgias  . Doxycycline Diarrhea  . Enalapril Cough  . Lipitor [Atorvastatin]     Elevated LFT's and Myalgias  . Metformin  And Related Diarrhea  . Prednisone     Intolerant to High Dose Prednisone  . Vicodin [Hydrocodone-Acetaminophen]     Increased Anxiety and Nervousness  . Acyclovir And Related Rash   PMHx:   Past Medical History  Diagnosis Date  . CVA (cerebral vascular accident) 53  . Tobacco abuse   . Allergic rhinitis   . Hypertension   . Hyperlipidemia   . Elevated hemoglobin A1c measurement   . Breast cancer     right  . COPD (chronic obstructive pulmonary disease)   . Lymphocytosis   . DJD (degenerative joint disease)   . ASCVD (arteriosclerotic cardiovascular disease)   . CVA (cerebral vascular accident)   . Atherosclerosis of aorta 2014    via CT AB/Pelvis- no AAA  . Ovarian cyst    FHx:    Reviewed / unchanged  SHx:    Reviewed / unchanged   Systems Review: Constitutional: Denies fever, chills, wt changes, headaches, insomnia, fatigue, night sweats, change in appetite. Eyes: Denies  redness, blurred vision, diplopia, discharge, itchy, watery eyes.  ENT: Denies discharge, congestion, post nasal drip, epistaxis, sore throat, earache, hearing loss, dental pain, tinnitus, vertigo, sinus pain, snoring.  CV: Denies chest pain, palpitations, irregular heartbeat, syncope, dyspnea, diaphoresis, orthopnea, PND, claudication or edema. Respiratory: denies cough, dyspnea, DOE, pleurisy, hoarseness, laryngitis, wheezing.  Gastrointestinal: Denies dysphagia, odynophagia, heartburn, reflux, water brash, abdominal pain or cramps, nausea, vomiting, bloating, diarrhea, constipation, hematemesis, melena, hematochezia  or hemorrhoids. Genitourinary: Denies dysuria, frequency, urgency, nocturia, hesitancy, discharge, hematuria or flank pain. Musculoskeletal: Denies arthralgias, myalgias, stiffness, jt. swelling, pain, limping or strain/sprain.  Skin: Denies pruritus, rash, hives, warts, acne, eczema or change in skin lesion(s). Neuro: No weakness, tremor, incoordination, spasms, paresthesia or pain. Psychiatric: Denies confusion, memory loss or sensory loss. Endo: Denies change in weight, skin or hair change.  Heme/Lymph: No excessive bleeding, bruising or enlarged lymph nodes.  Exam:  BP 126/78  Pulse 64  Temp 97.5 F   Resp 16  Ht 5' 3.5" Wt 160 lb   BMI 27.89 kg/m2  Appears well nourished - in no distress. Eyes: PERRLA, EOMs, conjunctiva no swelling or erythema. Sinuses: No frontal/maxillary tenderness ENT/Mouth: EAC's clear, TM's nl w/o erythema, bulging. Nares clear w/o erythema, swelling, exudates. Oropharynx clear without erythema or exudates. Oral hygiene is good. Tongue normal, non obstructing. Hearing intact.  Neck: Supple. Thyroid nl. Car 2+/2+ without bruits, nodes or JVD. Chest: Respirations nl with BS clear & equal w/o rales, rhonchi, wheezing or stridor.  Cor: Heart sounds normal w/ regular rate and rhythm without sig. murmurs, gallops, clicks, or rubs. Peripheral pulses  normal and equal  without edema.  Abdomen: Soft & bowel sounds normal. Non-tender w/o guarding, rebound, hernias, masses, or organomegaly.  Lymphatics: Unremarkable.  Musculoskeletal: Full ROM all peripheral extremities, joint stability, 5/5 strength, and limping gait favoring the Rt.  Skin: Warm, dry without exposed rashes, lesions or ecchymosis apparent.  Neuro: Cranial nerves intact, reflexes equal bilaterally. Mild Rt spastic HP. Sensory-motor testing grossly intact. Tendon reflexes grossly intact.  Pysch: Alert & oriented x 3. Insight and judgement nl & appropriate. No ideations.  Assessment and Plan:  1. Hypertension - Continue monitor blood pressure at home. Continue diet/meds same.  2. Hyperlipidemia - Continue diet/meds, exercise,& lifestyle modifications. Continue monitor periodic cholesterol/liver & renal functions   3. Pre-diabetes - Continue diet, exercise, lifestyle modifications. Monitor appropriate labs.  4. Vitamin D Deficiency - Continue supplementation.  5. ASCVD/ CVA- residual mild Rt HP  6. NASH  Recommended regular exercise, BP monitoring, weight control, and discussed med and SE's. Recommended labs to assess and monitor clinical status. Further disposition pending results of labs.

## 2013-11-23 NOTE — Patient Instructions (Signed)

## 2013-11-24 LAB — BASIC METABOLIC PANEL WITH GFR
BUN: 15 mg/dL (ref 6–23)
CHLORIDE: 104 meq/L (ref 96–112)
CO2: 23 meq/L (ref 19–32)
CREATININE: 0.88 mg/dL (ref 0.50–1.10)
Calcium: 11 mg/dL — ABNORMAL HIGH (ref 8.4–10.5)
GFR, Est African American: 76 mL/min
GFR, Est Non African American: 66 mL/min
Glucose, Bld: 114 mg/dL — ABNORMAL HIGH (ref 70–99)
Potassium: 4.2 mEq/L (ref 3.5–5.3)
SODIUM: 139 meq/L (ref 135–145)

## 2013-11-24 LAB — HEPATIC FUNCTION PANEL
ALT: 28 U/L (ref 0–35)
AST: 25 U/L (ref 0–37)
Albumin: 4.7 g/dL (ref 3.5–5.2)
Alkaline Phosphatase: 65 U/L (ref 39–117)
BILIRUBIN DIRECT: 0.1 mg/dL (ref 0.0–0.3)
Indirect Bilirubin: 0.4 mg/dL (ref 0.2–1.2)
Total Bilirubin: 0.5 mg/dL (ref 0.2–1.2)
Total Protein: 7.5 g/dL (ref 6.0–8.3)

## 2013-11-24 LAB — INSULIN, FASTING: Insulin fasting, serum: 90 u[IU]/mL — ABNORMAL HIGH (ref 3–28)

## 2013-11-24 LAB — LIPID PANEL
CHOL/HDL RATIO: 3.6 ratio
Cholesterol: 167 mg/dL (ref 0–200)
HDL: 47 mg/dL (ref >39)
LDL CALC: 60 mg/dL (ref 0–99)
Triglycerides: 302 mg/dL — ABNORMAL HIGH (ref <150)
VLDL: 60 mg/dL — ABNORMAL HIGH (ref 0–40)

## 2013-11-24 LAB — TSH: TSH: 2.655 u[IU]/mL (ref 0.350–4.500)

## 2013-11-24 LAB — HEMOGLOBIN A1C
Hgb A1c MFr Bld: 6.9 % — ABNORMAL HIGH (ref <5.7)
Mean Plasma Glucose: 151 mg/dL — ABNORMAL HIGH (ref <117)

## 2013-11-24 LAB — MAGNESIUM: Magnesium: 2.3 mg/dL (ref 1.5–2.5)

## 2013-11-24 LAB — VITAMIN D 25 HYDROXY (VIT D DEFICIENCY, FRACTURES): Vit D, 25-Hydroxy: 57 ng/mL (ref 30–89)

## 2013-11-24 NOTE — Patient Instructions (Addendum)
   Your procedure is scheduled on:  Tuesday, May 12  Enter through the Main Entrance of [facility] at: 6 AM Pick up the phone at the desk and dial [phone] and inform us of your arrival.  Please call this number if you have any problems the morning of surgery: [phone]  Remember: Do not eat or drink after midnight: Monday Take these medicines the morning of surgery with a SIP OF WATER:  Lasix, losartan, spiriva.  Bring albuterol inhaler with you on day of surgery. Patient instructed to withhold Metformin on Tuesday, day of surgery.  We will check your blood sugar upon arrival to Short Stay Department.  Do not wear jewelry, make-up, or FINGER nail polish No metal in your hair or on your body. Do not wear lotions, powders, perfumes.  You may wear deodorant.  Do not bring valuables to the hospital. Contacts, dentures or bridgework may not be worn into surgery.  Patients discharged on the day of surgery will not be allowed to drive home.  Home with husband [name] cell [phone].

## 2013-11-24 NOTE — Pre-Procedure Instructions (Signed)
Call rec'd from Dr Gaetano Net asking that [place]. Be notified of pt's resp status prior to PAT tomorrow (11/25/13), Information reviewed by Dr. Royce Macadamia, no orders given.

## 2013-11-25 ENCOUNTER — Other Ambulatory Visit: Payer: Self-pay

## 2013-11-25 ENCOUNTER — Encounter (HOSPITAL_COMMUNITY): Payer: Self-pay

## 2013-11-25 ENCOUNTER — Encounter (HOSPITAL_COMMUNITY)
Admission: RE | Admit: 2013-11-25 | Discharge: 2013-11-25 | Disposition: A | Discharge status: Home / Self Care | Payer: Medicare HMO | Source: Ambulatory Visit | Attending: Obstetrics and Gynecology | Admitting: Obstetrics and Gynecology

## 2013-11-25 DIAGNOSIS — Z01812 Encounter for preprocedural laboratory examination: Secondary | ICD-10-CM | POA: Insufficient documentation

## 2013-11-25 DIAGNOSIS — Z01810 Encounter for preprocedural cardiovascular examination: Secondary | ICD-10-CM | POA: Insufficient documentation

## 2013-11-25 NOTE — Anesthesia Preprocedure Evaluation (Addendum)
Anesthesia Evaluation  Patient identified by MRN, date of birth, ID band Patient awake    Reviewed: Allergy & Precautions, H&P , Patient's Chart, lab work & pertinent test results  Airway Mallampati: III TM Distance: >3 FB Neck ROM: Full    Dental  (+) Upper Dentures, Lower Dentures   Pulmonary shortness of breath and with exertion, COPD COPD inhaler, Current Smoker,  breath sounds clear to auscultation  Pulmonary exam normal + decreased breath sounds      Cardiovascular hypertension, Pt. 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 'bladder tack' woke up several hours post op w/ neuro Sx Residual right sided weakness  Neuromuscular disease CVA, Residual Symptoms    GI/Hepatic negative GI ROS, (+) Cirrhosis -      ,   Endo/Other  diabetes, Well Controlled, Type 2, Oral Hypoglycemic AgentsHyperlipidemia Breast Ca S/P Right MRM w/ axillary node dissection  Renal/GU negative Renal ROS  negative genitourinary   Musculoskeletal  (+) Arthritis -,   Abdominal (+) + obese,   Peds  Hematology negative hematology ROS (+)   Anesthesia Other Findings Right UE lymphedema  Reproductive/Obstetrics Right ovarian Cyst                         Anesthesia Physical Anesthesia Plan  ASA: III  Anesthesia Plan: General   Post-op Pain Management:    Induction: Intravenous  Airway Management Planned: Oral ETT  Additional Equipment:   Intra-op Plan:   Post-operative Plan: Extubation in OR  Informed Consent: I have reviewed the patients History and Physical, chart, labs and discussed the procedure including the risks, benefits and alternatives for the proposed anesthesia with the patient or authorized representative who has indicated his/her  understanding and acceptance.   Dental advisory given  Plan Discussed with: CRNA, Anesthesiologist and Surgeon  Anesthesia Plan Comments:         Anesthesia Quick Evaluation

## 2013-11-25 NOTE — Pre-Procedure Instructions (Signed)
Dr Royce Macadamia wanted anesthesia and PACU records from 08/12/83.  Per Medical Records at [facility], these records have been destroyed.  Dr Royce Macadamia made aware.

## 2013-11-29 MED ORDER — GENTAMICIN SULFATE 40 MG/ML IJ SOLN
INTRAVENOUS | Status: AC
  Administered 2013-11-30: 113 mL via INTRAVENOUS
  Filled 2013-11-29: qty 7.75

## 2013-11-29 NOTE — H&P (Signed)
[name] is an 73 y.o. female S/P TAH noted to have a right ovarian cyst on CT in December of '14. It initially measured 2.8 cm, then 3.5 cm on a follow up CT. Diverticulosis, hepatic steatosis and atherosclerotic disease also noted. No adenopathy or ascites was noted. U/S in my office measured a 4.5 cm right ovarian cyst with thin internal septations and a CA-125 of 6.9. Consultation with Dr Fermin Schwab was obtained and he felt it was most likely a benign cyst but in view of possible growth recommended laparoscopic BSO. U/S in my office on 11/25/13 measured a 3.7 cm thin walled right ovarian cyst. Patient has C/O occasional mill LLQ pain unchanged from December 2014.  Pertinent Gynecological History: Menses: post-menopausal Bleeding: N/A Contraception: abstinence DES exposure: denies Blood transfusions: none Sexually transmitted diseases: no past history Previous GYN Procedures: See above  Last mammogram: normal Date: 2014 Last pap: normal Date: 2015 OB History: G2, P2   Menstrual History: Menarche age: unknown  No LMP recorded. Patient is postmenopausal.    Past Medical History  Diagnosis Date  . CVA (cerebral vascular accident) 52  . Tobacco abuse   . Allergic rhinitis   . Hypertension   . Hyperlipidemia   . Elevated hemoglobin A1c measurement   . COPD (chronic obstructive pulmonary disease)   . Lymphocytosis     right arm  . ASCVD (arteriosclerotic cardiovascular disease)   . CVA (cerebral vascular accident)   . Atherosclerosis of aorta 2014    via CT AB/Pelvis- no AAA  . Ovarian cyst   . SVD (spontaneous vaginal delivery)     x 2  . Shortness of breath     occasional  . Diabetes mellitus without complication   . DJD (degenerative joint disease)     neck, lower back, feet, hands  . Breast cancer 1990    right  . Neuromuscular disorder     right arm/leg  weakness from stroke 1985    Past Surgical History  Procedure Laterality Date  . Mastectomy  1990   Right, no adjuvant  . Cholecystectomy  2006  . Abdominal hysterectomy  1976  . Cholecystectomy N/A 2008    Dr. Rosana Hoes  . Excision of benign salivary tumor Left 1968  . Shoulder surgery Left 2007    Dr. Percell Miller  . Back surgery  2005    lower back  . Multiple tooth extractions    . Breast surgery  1990    right  . Bladder tact  1985  . Eye surgery      bilateral cataract eye surgery - now low vision    Family History  Problem Relation Age of Onset  . Emphysema Brother   . Emphysema Brother   . Emphysema Sister   . Allergies Brother     all brothers and sisters  . Asthma Sister   . Asthma Sister   . Heart disease Mother   . Heart disease Sister   . Heart disease Brother   . Cancer Mother     breast  . Cancer Sister     breast  . Colon cancer Neg Hx     Social History:  reports that she has been smoking Cigarettes.  She has a 27.5 pack-year smoking history. She uses smokeless tobacco. She reports that she does not drink alcohol or use illicit drugs.  Allergies:  Allergies  Allergen Reactions  . Ace Inhibitors Cough  . Amoxil [Amoxicillin] Diarrhea  . Bisoprolol  Decreased Heart Rate  . Ciprofloxacin Diarrhea  . Crestor [Rosuvastatin]     Elevated LFT's and Myalgias  . Doxycycline Diarrhea  . Enalapril Cough  . Lipitor [Atorvastatin]     Elevated LFT's and Myalgias  . Metformin And Related Diarrhea  . Prednisone     Intolerant to High Dose Prednisone  . Vicodin [Hydrocodone-Acetaminophen]     Increased Anxiety and Nervousness  . Acyclovir And Related Rash    No prescriptions prior to admission    Review of Systems  Constitutional: Negative for fever.    Height 5' 3.5" (1.613 m), weight 73.483 kg (162 lb). Physical Exam  Cardiovascular: Normal rate and regular rhythm.   Respiratory: Effort normal and breath sounds normal.  GI: Soft. There is no tenderness.  Neurological: She has normal reflexes.    No results found for this or any previous visit  (from the past 24 hour(s)).  No results found.  Assessment/Plan: 73 yo G2P2 S/P TAH with right ovarian cyst and question of some growth, S/P Gyn oncology consultation for L/S BSO Risks reviewed with patient including infection, organ damage, bleeding/transfusion-Hep/HIV, DVT/PE, pneumonia, return to OR, laparotomy, pelvic/abdominal pain. All questions answered.  Shon Millet II 11/29/2013, 5:15 PM

## 2013-11-30 ENCOUNTER — Encounter (HOSPITAL_COMMUNITY): Payer: Self-pay | Admitting: Anesthesiology

## 2013-11-30 ENCOUNTER — Encounter (HOSPITAL_COMMUNITY): Payer: Medicare HMO | Admitting: Anesthesiology

## 2013-11-30 ENCOUNTER — Ambulatory Visit (HOSPITAL_COMMUNITY)
Admission: RE | Admit: 2013-11-30 | Discharge: 2013-11-30 | Disposition: A | Discharge status: Home / Self Care | Payer: Medicare HMO | Source: Ambulatory Visit | Attending: Obstetrics and Gynecology | Admitting: Obstetrics and Gynecology

## 2013-11-30 ENCOUNTER — Encounter (HOSPITAL_COMMUNITY)
Admission: RE | Disposition: A | Discharge status: Home / Self Care | Payer: Self-pay | Source: Ambulatory Visit | Attending: Obstetrics and Gynecology

## 2013-11-30 ENCOUNTER — Ambulatory Visit (HOSPITAL_COMMUNITY): Payer: Medicare HMO | Admitting: Anesthesiology

## 2013-11-30 DIAGNOSIS — K7689 Other specified diseases of liver: Secondary | ICD-10-CM | POA: Insufficient documentation

## 2013-11-30 DIAGNOSIS — N736 Female pelvic peritoneal adhesions (postinfective): Secondary | ICD-10-CM | POA: Insufficient documentation

## 2013-11-30 DIAGNOSIS — D72820 Lymphocytosis (symptomatic): Secondary | ICD-10-CM | POA: Insufficient documentation

## 2013-11-30 DIAGNOSIS — J4489 Other specified chronic obstructive pulmonary disease: Secondary | ICD-10-CM | POA: Insufficient documentation

## 2013-11-30 DIAGNOSIS — E785 Hyperlipidemia, unspecified: Secondary | ICD-10-CM | POA: Diagnosis not present

## 2013-11-30 DIAGNOSIS — N83209 Unspecified ovarian cyst, unspecified side: Secondary | ICD-10-CM | POA: Insufficient documentation

## 2013-11-30 DIAGNOSIS — N838 Other noninflammatory disorders of ovary, fallopian tube and broad ligament: Secondary | ICD-10-CM | POA: Insufficient documentation

## 2013-11-30 DIAGNOSIS — I7090 Unspecified atherosclerosis: Secondary | ICD-10-CM | POA: Diagnosis not present

## 2013-11-30 DIAGNOSIS — I700 Atherosclerosis of aorta: Secondary | ICD-10-CM | POA: Diagnosis not present

## 2013-11-30 DIAGNOSIS — J449 Chronic obstructive pulmonary disease, unspecified: Secondary | ICD-10-CM | POA: Diagnosis not present

## 2013-11-30 DIAGNOSIS — Z8673 Personal history of transient ischemic attack (TIA), and cerebral infarction without residual deficits: Secondary | ICD-10-CM | POA: Diagnosis not present

## 2013-11-30 DIAGNOSIS — Z853 Personal history of malignant neoplasm of breast: Secondary | ICD-10-CM | POA: Insufficient documentation

## 2013-11-30 DIAGNOSIS — K5730 Diverticulosis of large intestine without perforation or abscess without bleeding: Secondary | ICD-10-CM | POA: Insufficient documentation

## 2013-11-30 DIAGNOSIS — F17200 Nicotine dependence, unspecified, uncomplicated: Secondary | ICD-10-CM | POA: Insufficient documentation

## 2013-11-30 DIAGNOSIS — I2510 Atherosclerotic heart disease of native coronary artery without angina pectoris: Secondary | ICD-10-CM | POA: Insufficient documentation

## 2013-11-30 DIAGNOSIS — D279 Benign neoplasm of unspecified ovary: Secondary | ICD-10-CM | POA: Diagnosis not present

## 2013-11-30 DIAGNOSIS — E119 Type 2 diabetes mellitus without complications: Secondary | ICD-10-CM | POA: Insufficient documentation

## 2013-11-30 DIAGNOSIS — I10 Essential (primary) hypertension: Secondary | ICD-10-CM | POA: Diagnosis not present

## 2013-11-30 HISTORY — PX: LAPAROSCOPIC BILATERAL SALPINGO OOPHERECTOMY: SHX5890

## 2013-11-30 LAB — GLUCOSE, CAPILLARY
Glucose-Capillary: 121 mg/dL — ABNORMAL HIGH (ref 70–99)
Glucose-Capillary: 136 mg/dL — ABNORMAL HIGH (ref 70–99)

## 2013-11-30 SURGERY — SALPINGO-OOPHORECTOMY, BILATERAL, LAPAROSCOPIC
Anesthesia: General | Site: Abdomen | Laterality: Bilateral

## 2013-11-30 MED ORDER — SODIUM CHLORIDE 0.9 % IJ SOLN
INTRAMUSCULAR | Status: AC
  Filled 2013-11-30: qty 3

## 2013-11-30 MED ORDER — LIDOCAINE HCL 1 % IJ SOLN
INTRAMUSCULAR | Status: AC
  Filled 2013-11-30: qty 20

## 2013-11-30 MED ORDER — SODIUM CHLORIDE 0.9 % IJ SOLN
INTRAMUSCULAR | Status: DC | PRN
  Administered 2013-11-30: 10 mL via INTRAVENOUS

## 2013-11-30 MED ORDER — PROPOFOL 10 MG/ML IV BOLUS
INTRAVENOUS | Status: DC | PRN
  Administered 2013-11-30: 100 mg via INTRAVENOUS
  Administered 2013-11-30 (×2): 50 mg via INTRAVENOUS

## 2013-11-30 MED ORDER — EPHEDRINE 5 MG/ML INJ
INTRAVENOUS | Status: AC
  Filled 2013-11-30: qty 10

## 2013-11-30 MED ORDER — MIDAZOLAM HCL 2 MG/2ML IJ SOLN
INTRAMUSCULAR | Status: AC
  Filled 2013-11-30: qty 2

## 2013-11-30 MED ORDER — LIDOCAINE HCL (CARDIAC) 20 MG/ML IV SOLN
INTRAVENOUS | Status: AC
  Filled 2013-11-30: qty 5

## 2013-11-30 MED ORDER — ALBUTEROL SULFATE HFA 108 (90 BASE) MCG/ACT IN AERS: INHALATION_SPRAY | RESPIRATORY_TRACT | Status: DC | PRN

## 2013-11-30 MED ORDER — PROPOFOL 10 MG/ML IV EMUL
INTRAVENOUS | Status: AC
  Filled 2013-11-30: qty 20

## 2013-11-30 MED ORDER — BUPIVACAINE HCL (PF) 0.5 % IJ SOLN
INTRAMUSCULAR | Status: DC | PRN
  Administered 2013-11-30: 30 mL

## 2013-11-30 MED ORDER — FENTANYL CITRATE 0.05 MG/ML IJ SOLN
INTRAMUSCULAR | Status: AC
  Administered 2013-11-30: 25 ug via INTRAVENOUS
  Filled 2013-11-30: qty 2

## 2013-11-30 MED ORDER — FENTANYL CITRATE 0.05 MG/ML IJ SOLN
INTRAMUSCULAR | Status: DC | PRN
  Administered 2013-11-30: 25 ug via INTRAVENOUS
  Administered 2013-11-30: 50 ug via INTRAVENOUS
  Administered 2013-11-30 (×2): 25 ug via INTRAVENOUS

## 2013-11-30 MED ORDER — ROCURONIUM BROMIDE 100 MG/10ML IV SOLN
INTRAVENOUS | Status: DC | PRN
  Administered 2013-11-30: 30 mg via INTRAVENOUS

## 2013-11-30 MED ORDER — BUPIVACAINE HCL (PF) 0.5 % IJ SOLN
INTRAMUSCULAR | Status: AC
  Filled 2013-11-30: qty 30

## 2013-11-30 MED ORDER — MEPERIDINE HCL 25 MG/ML IJ SOLN: 6.2500 mg | INTRAMUSCULAR | Status: DC | PRN

## 2013-11-30 MED ORDER — METHYLENE BLUE 1 % INJ SOLN
INTRAMUSCULAR | Status: AC
  Filled 2013-11-30: qty 10

## 2013-11-30 MED ORDER — ONDANSETRON HCL 4 MG/2ML IJ SOLN: 4.0000 mg | Freq: Once | INTRAMUSCULAR | Status: DC | PRN

## 2013-11-30 MED ORDER — GLYCOPYRROLATE 0.2 MG/ML IJ SOLN
INTRAMUSCULAR | Status: DC | PRN
  Administered 2013-11-30: 0.4 mg via INTRAVENOUS
  Administered 2013-11-30: 0.3 mg via INTRAVENOUS

## 2013-11-30 MED ORDER — ONDANSETRON HCL 4 MG/2ML IJ SOLN
INTRAMUSCULAR | Status: AC
  Filled 2013-11-30: qty 2

## 2013-11-30 MED ORDER — FENTANYL CITRATE 0.05 MG/ML IJ SOLN
INTRAMUSCULAR | Status: AC
  Filled 2013-11-30: qty 5

## 2013-11-30 MED ORDER — DIPHENHYDRAMINE HCL 50 MG/ML IJ SOLN
INTRAMUSCULAR | Status: DC | PRN
  Administered 2013-11-30: 12.5 mg via INTRAVENOUS

## 2013-11-30 MED ORDER — SODIUM CHLORIDE 0.9 % IJ SOLN
INTRAMUSCULAR | Status: AC
  Filled 2013-11-30: qty 10

## 2013-11-30 MED ORDER — EPHEDRINE SULFATE 50 MG/ML IJ SOLN
INTRAMUSCULAR | Status: DC | PRN
  Administered 2013-11-30: 10 mg via INTRAVENOUS
  Administered 2013-11-30: 5 mg via INTRAVENOUS

## 2013-11-30 MED ORDER — NEOSTIGMINE METHYLSULFATE 10 MG/10ML IV SOLN
INTRAVENOUS | Status: DC | PRN
  Administered 2013-11-30: 3 mg via INTRAVENOUS

## 2013-11-30 MED ORDER — NEOSTIGMINE METHYLSULFATE 10 MG/10ML IV SOLN
INTRAVENOUS | Status: AC
  Filled 2013-11-30: qty 1

## 2013-11-30 MED ORDER — PHENYLEPHRINE 8 MG IN D5W 100 ML (0.08MG/ML) PREMIX OPTIME
INJECTION | INTRAVENOUS | Status: DC | PRN
  Administered 2013-11-30: 60 ug/min via INTRAVENOUS

## 2013-11-30 MED ORDER — ONDANSETRON HCL 4 MG/2ML IJ SOLN
INTRAMUSCULAR | Status: DC | PRN
  Administered 2013-11-30: 4 mg via INTRAVENOUS

## 2013-11-30 MED ORDER — DIPHENHYDRAMINE HCL 50 MG/ML IJ SOLN
INTRAMUSCULAR | Status: AC
  Filled 2013-11-30: qty 1

## 2013-11-30 MED ORDER — GLYCOPYRROLATE 0.2 MG/ML IJ SOLN
INTRAMUSCULAR | Status: AC
  Filled 2013-11-30: qty 2

## 2013-11-30 MED ORDER — LACTATED RINGERS IR SOLN
Status: DC | PRN
  Administered 2013-11-30: 3000 mL

## 2013-11-30 MED ORDER — ROCURONIUM BROMIDE 100 MG/10ML IV SOLN
INTRAVENOUS | Status: AC
  Filled 2013-11-30: qty 1

## 2013-11-30 MED ORDER — FENTANYL CITRATE 0.05 MG/ML IJ SOLN
25.0000 ug | INTRAMUSCULAR | Status: DC | PRN
  Administered 2013-11-30: 25 ug via INTRAVENOUS

## 2013-11-30 MED ORDER — HEPARIN SODIUM (PORCINE) 5000 UNIT/ML IJ SOLN
INTRAMUSCULAR | Status: AC
  Filled 2013-11-30: qty 1

## 2013-11-30 MED ORDER — LACTATED RINGERS IV SOLN
INTRAVENOUS | Status: DC
Start: 2013-11-30 — End: 2013-11-30
  Administered 2013-11-30 (×2): via INTRAVENOUS

## 2013-11-30 MED ORDER — GLYCOPYRROLATE 0.2 MG/ML IJ SOLN
INTRAMUSCULAR | Status: AC
  Filled 2013-11-30: qty 3

## 2013-11-30 MED ORDER — LIDOCAINE HCL (CARDIAC) 20 MG/ML IV SOLN
INTRAVENOUS | Status: DC | PRN
  Administered 2013-11-30: 60 mg via INTRAVENOUS

## 2013-11-30 MED ORDER — HEPARIN SODIUM (PORCINE) 5000 UNIT/ML IJ SOLN
INTRAMUSCULAR | Status: DC | PRN
  Administered 2013-11-30: 5000 [IU] via SUBCUTANEOUS

## 2013-11-30 MED ORDER — PHENYLEPHRINE HCL 10 MG/ML IJ SOLN
INTRAMUSCULAR | Status: DC | PRN
  Administered 2013-11-30: 60 ug via INTRAVENOUS

## 2013-11-30 MED ORDER — HEPARIN SODIUM (PORCINE) 5000 UNIT/ML IJ SOLN
INTRAMUSCULAR | Status: DC | PRN
Start: 2013-11-30 — End: 2013-11-30
  Administered 2013-11-30: 5000 [IU] via SUBCUTANEOUS

## 2013-11-30 SURGICAL SUPPLY — 30 items
ADH SKN CLS APL DERMABOND .7 (GAUZE/BANDAGES/DRESSINGS) ×1
BAG SPEC RTRVL LRG 6X4 10 (ENDOMECHANICALS) ×1
BARRIER ADHS 3X4 INTERCEED (GAUZE/BANDAGES/DRESSINGS) IMPLANT
BRR ADH 4X3 ABS CNTRL BYND (GAUZE/BANDAGES/DRESSINGS)
CABLE HIGH FREQUENCY MONO STRZ (ELECTRODE) IMPLANT
CATH ROBINSON RED A/P 16FR (CATHETERS) ×3 IMPLANT
CLOSURE WOUND 1/4 X3 (GAUZE/BANDAGES/DRESSINGS)
CLOTH BEACON ORANGE TIMEOUT ST (SAFETY) ×3 IMPLANT
DERMABOND ADVANCED (GAUZE/BANDAGES/DRESSINGS) ×2
DERMABOND ADVANCED .7 DNX12 (GAUZE/BANDAGES/DRESSINGS) ×1 IMPLANT
GLOVE BIO SURGEON STRL SZ8 (GLOVE) ×6 IMPLANT
NEEDLE INSUFFLATION 120MM (ENDOMECHANICALS) ×3 IMPLANT
NS IRRIG 1000ML POUR BTL (IV SOLUTION) ×3 IMPLANT
PACK LAPAROSCOPY BASIN (CUSTOM PROCEDURE TRAY) ×3 IMPLANT
POUCH SPECIMEN RETRIEVAL 10MM (ENDOMECHANICALS) ×2 IMPLANT
PROTECTOR NERVE ULNAR (MISCELLANEOUS) ×3 IMPLANT
SEALER TISSUE G2 CVD JAW 35 (ENDOMECHANICALS) IMPLANT
SEALER TISSUE G2 CVD JAW 45CM (ENDOMECHANICALS) ×3 IMPLANT
SET IRRIG TUBING LAPAROSCOPIC (IRRIGATION / IRRIGATOR) IMPLANT
STRIP CLOSURE SKIN 1/4X3 (GAUZE/BANDAGES/DRESSINGS) IMPLANT
SUT VIC AB 3-0 PS2 18 (SUTURE) ×6
SUT VIC AB 3-0 PS2 18XBRD (SUTURE) ×2 IMPLANT
SUT VICRYL 0 UR6 27IN ABS (SUTURE) ×3 IMPLANT
SYR 30ML LL (SYRINGE) IMPLANT
TOWEL OR 17X24 6PK STRL BLUE (TOWEL DISPOSABLE) ×6 IMPLANT
TROCAR BALLN GELPORT 12X130M (ENDOMECHANICALS) ×2 IMPLANT
TROCAR XCEL NON-BLD 11X100MML (ENDOMECHANICALS) ×2 IMPLANT
TROCAR XCEL NON-BLD 5MMX100MML (ENDOMECHANICALS) ×2 IMPLANT
WARMER LAPAROSCOPE (MISCELLANEOUS) ×3 IMPLANT
WATER STERILE IRR 1000ML POUR (IV SOLUTION) ×1 IMPLANT

## 2013-11-30 NOTE — Anesthesia Postprocedure Evaluation (Signed)
  Anesthesia Post-op Note  Patient: [name]  Procedure(s) Performed: Procedure(s): LAPAROSCOPIC RIGHT SALPINGO OOPHORECTOMY with extensive lysis of adhesions (Bilateral) Patient is awake and responsive. Pain and nausea are reasonably well controlled. Vital signs are stable and clinically acceptable. Oxygen saturation is clinically acceptable. There are no apparent anesthetic complications at this time. Patient is ready for discharge.

## 2013-11-30 NOTE — Discharge Instructions (Signed)

## 2013-11-30 NOTE — Brief Op Note (Signed)
11/30/2013  9:35 AM  PATIENT:  [name]  73 y.o. female  PRE-OPERATIVE DIAGNOSIS:  RIGHT OVARIAN CYST  POST-OPERATIVE DIAGNOSIS:  RIGHT OVARIAN CYST  PROCEDURE:  Procedure(s): LAPAROSCOPIC RIGHT SALPINGO OOPHORECTOMY with extensive lysis of adhesions (Bilateral)  SURGEON:  Surgeon(s) and Role:    * Shon Millet II, MD - Primary  PHYSICIAN ASSISTANT:   ASSISTANTS: none   ANESTHESIA:   general  EBL:  Total I/O In: 1400 [I.V.:1400] Out: 30 [Urine:30]  BLOOD ADMINISTERED:none  DRAINS: none   LOCAL MEDICATIONS USED:  Amount: 30 ml, NONE and OTHER .5% Marcaine  SPECIMEN:  Source of Specimen:  right ovary and tube, aspirate of right ovarian cyst, washings of pelvis  DISPOSITION OF SPECIMEN:  PATHOLOGY  COUNTS:  YES  TOURNIQUET:  * No tourniquets in log *  DICTATION: .Other Dictation: Dictation Number [id number]  PLAN OF CARE: Discharge to home after PACU  PATIENT DISPOSITION:  PACU - hemodynamically stable.   Delay start of Pharmacological VTE agent (>24hrs) due to surgical blood loss or risk of bleeding: not applicable

## 2013-11-30 NOTE — Transfer of Care (Signed)
Immediate Anesthesia Transfer of Care Note  Patient: [name]  Procedure(s) Performed: Procedure(s): LAPAROSCOPIC RIGHT SALPINGO OOPHORECTOMY with extensive lysis of adhesions (Bilateral)  Patient Location: PACU  Anesthesia Type:General  Level of Consciousness: awake, sedated and patient cooperative  Airway & Oxygen Therapy: Patient Spontanous Breathing and Patient connected to nasal cannula oxygen  Post-op Assessment: Report given to PACU RN and Post -op Vital signs reviewed and stable  Post vital signs: Reviewed and stable  Complications: No apparent anesthesia complications

## 2013-11-30 NOTE — Progress Notes (Signed)
No changes to H&P per patient history Reviewed with patient procedure-L/S with BSO All questions answered

## 2013-12-01 ENCOUNTER — Encounter (HOSPITAL_COMMUNITY): Payer: Self-pay | Admitting: Obstetrics and Gynecology

## 2013-12-01 MED FILL — Heparin Sodium (Porcine) Inj 5000 Unit/ML: INTRAMUSCULAR | Qty: 2 | Status: AC

## 2013-12-01 NOTE — Op Note (Signed)
[name], [name] NO.:  [id number]  MEDICAL RECORD NO.:  [id number]  LOCATION:  [facility]                          FACILITY:  [facility]  PHYSICIAN:  Daleen Bo. Gaetano Net, M.D. DATE OF BIRTH:  09-10-1940  DATE OF PROCEDURE:  11/30/2013 DATE OF DISCHARGE:  11/30/2013                              OPERATIVE REPORT   PREOPERATIVE DIAGNOSIS:  Right ovarian cyst.  POSTOPERATIVE DIAGNOSES: 1. Right ovarian cyst. 2. Abdominopelvic adhesions.  PROCEDURES:  Open laparoscopy with right salpingo-oophorectomy and extensive lysis of adhesions.  SURGEON:  Daleen Bo. Gaetano Net, M.D.  ANESTHESIA:  General endotracheal intubation.  SPECIMENS: 1. Right ovary and tube. 2. Aspirate of right ovarian cyst. 3. Washings of pelvis.  All to Pathology.  ESTIMATED BLOOD LOSS:  Drops.  INDICATIONS AND CONSENT:  This patient is a 73 year old patient status post total abdominal hysterectomy many years ago who had a CAT scan in December of 2014 to investigate left lower quadrant pain.  She was found to have a right ovarian cyst.  Subsequent studies were suspicious for growth of the cyst.  Consultation with Dr. Fermin Schwab of GYN/Oncology was undertaken.  It was felt to be a probable benign cyst. The laparoscopic BSO was recommended.  Details are dictated in the history and physical.  The patient also has multiple medical issues including COPD.  Laparoscopy with bilateral salpingo-oophorectomy was discussed with the patient and her husband preoperatively.  Removal of both tubes and ovaries if possible were discussed.  Preservation of left ovary if it were severely adhesed was discussed in detail of the normal appearance on repeated CTs and ultrasounds.  Potential risks and complications, as reviewed preoperatively including, but not limited to, infection, organ damage, bleeding, requiring transfusion of blood products with HIV and hepatitis acquisition, DVT, PE, pneumonia, laparotomy, return to  the operating room, pelvic abdominal pain, cardiopulmonary complications of anesthesia.  All questions were answered and consent is signed on the chart.  FINDINGS:  There is a dense row of omental adhesions below her previous subumbilical midline incision extending from the umbilicus down to the pelvis.  The right ovary has a smooth approximately 4 cm cyst.  The ovary was free with no adhesions.  No excrescences.  Peritoneal surfaces are without studding.  There is no ascites noted upon entry into the peritoneal cavity.  The left ovary is densely adherent beneath the left sigmoid and has never fully visualized.  DESCRIPTION OF PROCEDURE:  The patient was taken to the operating room where she was identified and placed under general anesthesia via endotracheal intubation.  She was then placed in dorsal lithotomy position.  Time-out undertaken.  She was prepped abdominally and vaginally.  Bladder straight catheterized.  Sponge on a stick was placed in the vagina and she was draped in a sterile fashion.  Subumbilical skin as well as the suprapubic areas injected with approximately 7 mL of 1.5% plain Marcaine.  The subumbilical skin was grasped with 2 Allis clamps and elevated.  Small subumbilical incision was made and dissection was carried out to the fascia with curved Mayo scissors. Fascia was then grasped with Kocher clamps, and the fascia was entered carefully with curved Mayo scissors.  The pursestring of 0 Vicryl was then placed around this and held with hemostats.  The peritoneal cavity was then entered without difficulty.  The disposable Hasson trocar sleeve was then placed, and the operative scope was then used.  The above-described adhesions were noted.  In order to visualize the pelvis, the omental adhesions were taken down at the point of insertion of anterior abdominal wall with the EnSeal bipolar cautery cutting instrument without difficulty.  Good hemostasis was maintained.   At this point, small suprapubic incisions made in the midline and a 5-mm Xcel disposable trocar sleeve was placed under direct visualization without difficulty.  Peritoneal washings were then taken and sent for cytology. The right ovary could then be identified and easily mobilized.  The EnSeal bipolar cautery cutting instrument was used to take down the infundibulopelvic ligament.  The remainder of the attachment the ovary well clear of the course of the ureter.  Then using the 5 mm laparoscope through the suprapubic trocar sleeve, an EndoCatch was used through the umbilical incision to retrieve the ovary and bringing it up to the level of the umbilical incision.  Using the 5 mm laparoscope from below to carefully observed, spinal needle and syringe were used to aspirate the ovary without puncturing the EndoCatch bag.  This was obtained straw- colored fluid, which was sent for cytology and successfully decompressed the ovary allowed easy retrieval through the umbilical incision.  The umbilical trocar sleeve was then replaced.  Irrigation was carried out and good hemostasis was noted all around.  Repeated attempts at exposing the left ovary were unsuccessful.  At this point, due to the repeated benign appearing evaluation of the left ovary on CT and ultrasound, the patient's multiple medical comorbidities that could be exacerbated by prolonged surgery and anticipated further length of the surgery in attempt to retrieve the left ovary, a decision was made to terminate the surgery at this point.  The remaining approximately 26 mL of 1.5% plain Marcaine was instilled into peritoneal cavity.  Suprapubic trocar sleeve was removed.  The umbilical trocar sleeve was removed.  Pneumoperitoneum was completely reduced.  Pursestring suture of the fascia was then cinched tight and tied down.  Inspection reveals excellent closure of the fascia.  A 3-0 Vicryl was used to reapproximate the  subcutaneous tissues and the skin was closed with interrupted 3-0 Vicryl sutures. This suture was also used to reapproximate the suprapubic skin. Dermabond was placed on all.  Ring clamp was removed from the vagina. All counts were correct.  The patient was awakened, taken to recovery room in stable condition.     Daleen Bo Gaetano Net, M.D.     JET/MEDQ  D:  11/30/2013  T:  12/01/2013  Job:  [id number]

## 2013-12-04 ENCOUNTER — Encounter: Payer: Self-pay | Admitting: Internal Medicine

## 2013-12-04 NOTE — Assessment & Plan Note (Signed)
Plan- Nasal Decongestant nebulizer, Z-Pak to hold

## 2013-12-04 NOTE — Assessment & Plan Note (Signed)
She has shifted mostly to electronic cigarettes but emphasized she needs still to quit completely

## 2013-12-17 ENCOUNTER — Encounter: Payer: Self-pay | Admitting: Gastroenterology

## 2013-12-17 ENCOUNTER — Ambulatory Visit (INDEPENDENT_AMBULATORY_CARE_PROVIDER_SITE_OTHER): Payer: Medicare HMO | Admitting: Gastroenterology

## 2013-12-17 VITALS — BP 108/60 | HR 62 | Ht 63.5 in | Wt 159.0 lb

## 2013-12-17 DIAGNOSIS — K5730 Diverticulosis of large intestine without perforation or abscess without bleeding: Secondary | ICD-10-CM

## 2013-12-17 DIAGNOSIS — K7460 Unspecified cirrhosis of liver: Secondary | ICD-10-CM

## 2013-12-17 DIAGNOSIS — Z23 Encounter for immunization: Secondary | ICD-10-CM

## 2013-12-17 NOTE — Assessment & Plan Note (Addendum)
Question of cirrhosis raised by CT scan.  Lab is negative and there is no evidence for esophageal varices.  I initially recommended followup endoscopy in one year but I will reassess in March, 2016 after reviewing her lab and examining the patient.  She will be vaccinated against hepatitis B.

## 2013-12-17 NOTE — Progress Notes (Signed)
          History of Present Illness:  The patient has returned following upper and lower endoscopy.  The former did not demonstrate varices.  Diverticulosis only was seen on the latter.  The patient has no GI complaints.  Question of cirrhosis has been raised because of nodularity seen on CT scan.  Lab work is unrevealing.  Patient underwent a oophorectomy since her last visit.  She is without abdominal pain.   Review of Systems: Pertinent positive and negative review of systems were noted in the above HPI section. All other review of systems were otherwise negative.    Current Medications, Allergies, Past Medical History, Past Surgical History, Family History and Social History were reviewed in [facility] record  Vital signs were reviewed in today's medical record. Physical Exam: General: Well developed , well nourished, no acute distress   See Assessment and Plan under Problem List

## 2013-12-17 NOTE — Assessment & Plan Note (Signed)
Pain has resolved and was probably 2 to low-grade diverticulitis

## 2013-12-17 NOTE — Addendum Note (Signed)
Addended by: Oda Kilts on: 12/17/2013 03:28 PM   Modules accepted: Orders

## 2013-12-17 NOTE — Patient Instructions (Signed)
You will need to follow up with labs in March 2016: CBC and CMET You received your first HEP B injection today Your next Hep B injection is scheduled on 01/17/2014 at 2pm

## 2013-12-17 NOTE — Addendum Note (Signed)
Addended by: Oda Kilts on: 12/17/2013 03:39 PM   Modules accepted: Orders

## 2014-01-17 ENCOUNTER — Ambulatory Visit (INDEPENDENT_AMBULATORY_CARE_PROVIDER_SITE_OTHER): Payer: Medicare HMO | Admitting: Gastroenterology

## 2014-01-17 DIAGNOSIS — K7460 Unspecified cirrhosis of liver: Secondary | ICD-10-CM

## 2014-01-17 DIAGNOSIS — Z23 Encounter for immunization: Secondary | ICD-10-CM

## 2014-01-17 MED ORDER — HEPATITIS B VAC RECOMBINANT 5 MCG/0.5ML IJ SUSP: 0.5000 mL | Freq: Once | INTRAMUSCULAR | Status: DC

## 2014-02-24 ENCOUNTER — Other Ambulatory Visit: Payer: Self-pay

## 2014-02-24 ENCOUNTER — Ambulatory Visit: Payer: Self-pay | Admitting: Emergency Medicine

## 2014-02-24 ENCOUNTER — Encounter: Payer: Self-pay | Admitting: Internal Medicine

## 2014-02-24 ENCOUNTER — Ambulatory Visit (INDEPENDENT_AMBULATORY_CARE_PROVIDER_SITE_OTHER): Payer: Medicare HMO | Admitting: Internal Medicine

## 2014-02-24 VITALS — BP 106/58 | HR 70 | Temp 98.0°F | Resp 18 | Ht 63.5 in | Wt 159.0 lb

## 2014-02-24 DIAGNOSIS — E559 Vitamin D deficiency, unspecified: Secondary | ICD-10-CM

## 2014-02-24 DIAGNOSIS — N6019 Diffuse cystic mastopathy of unspecified breast: Secondary | ICD-10-CM

## 2014-02-24 DIAGNOSIS — I10 Essential (primary) hypertension: Secondary | ICD-10-CM

## 2014-02-24 DIAGNOSIS — R7309 Other abnormal glucose: Secondary | ICD-10-CM

## 2014-02-24 DIAGNOSIS — E782 Mixed hyperlipidemia: Secondary | ICD-10-CM

## 2014-02-24 DIAGNOSIS — J449 Chronic obstructive pulmonary disease, unspecified: Secondary | ICD-10-CM

## 2014-02-24 DIAGNOSIS — Z79899 Other long term (current) drug therapy: Secondary | ICD-10-CM

## 2014-02-24 LAB — CBC WITH DIFFERENTIAL/PLATELET
BASOS ABS: 0 10*3/uL (ref 0.0–0.1)
BASOS PCT: 0 % (ref 0–1)
EOS PCT: 2 % (ref 0–5)
Eosinophils Absolute: 0.2 10*3/uL (ref 0.0–0.7)
HEMATOCRIT: 36.6 % (ref 36.0–46.0)
Hemoglobin: 13 g/dL (ref 12.0–15.0)
Lymphocytes Relative: 45 % (ref 12–46)
Lymphs Abs: 4.5 10*3/uL — ABNORMAL HIGH (ref 0.7–4.0)
MCH: 32.8 pg (ref 26.0–34.0)
MCHC: 35.5 g/dL (ref 30.0–36.0)
MCV: 92.4 fL (ref 78.0–100.0)
MONO ABS: 0.9 10*3/uL (ref 0.1–1.0)
Monocytes Relative: 9 % (ref 3–12)
Neutro Abs: 4.4 10*3/uL (ref 1.7–7.7)
Neutrophils Relative %: 44 % (ref 43–77)
Platelets: 290 10*3/uL (ref 150–400)
RBC: 3.96 MIL/uL (ref 3.87–5.11)
RDW: 13.1 % (ref 11.5–15.5)
WBC: 10.1 10*3/uL (ref 4.0–10.5)

## 2014-02-24 NOTE — Progress Notes (Signed)
Patient ID: [name], female   DOB: 11/21/1940, 73 y.o.   MRN: [id number]   This very nice 73 y.o.MWF presents for 3 month follow up with Hypertension, Hyperlipidemia, Pre-Diabetes and Vitamin D Deficiency.    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  BP has been controlled and today's BP: 106/58 mmHg. Patient denies any cardiac type chest pain, palpitations, dyspnea/orthopnea/PND, dizziness, claudication, or dependent edema.   Hypercholesterolemia is controlled and at goal  with diet & meds, but TG are still high. Patient denies myalgias or other med SE's. Last Lipids were 11/23/2013: Cholesterol, Total 167; HDL Cholesterol by NMR 47; LDL (calc) 60; Triglycerides 302*   Also, the patient has history of PreDiabetes with A1c 5.9% in 2009 and 6.8% in 2012 and 6.5% in 2013 and last A1c was 11/23/2013: Hemoglobin-A1c 6.9%. Patient denies any symptoms of reactive hypoglycemia, diabetic polys, paresthesias or visual blurring.     Further, Patient has history of Vitamin D Deficiency of 12 in 2009 and patient supplements vitamin D without any suspected side-effects. Last vitamin D was 11/23/2013: Vit D, 25-Hydroxy 57  Medication List   albuterol 108 (90 BASE) MCG/ACT inhaler  Commonly known as:  PROVENTIL HFA;VENTOLIN HFA  Inhale 2 puffs into the lungs every 6 (six) hours as needed for wheezing or shortness of breath.     aspirin 81 MG tablet  Take 81 mg by mouth daily.     fenofibrate micronized 134 MG capsule  Commonly known as:  LOFIBRA  TAKE ONE CAPSULE BY MOUTH ONCE DAILY     fish oil-omega-3 fatty acids 1000 MG capsule  Take 1 capsule by mouth 3 (three) times daily.     Flax Seed Oil 1000 MG Caps  Take 1 capsule by mouth 3 (three) times daily.     fluticasone 50 MCG/ACT nasal spray  Commonly known as:  FLONASE  Place 2 sprays into both nostrils daily as needed for allergies or rhinitis.     furosemide 40 MG tablet  Commonly known as:   LASIX  Take 20 mg by mouth 3 (three) times a week. Mon, Wed, Fri     Hyoscyamine Sulfate 0.375 MG Tbcr  Take one tablet twice a day as needed abdominal pain     losartan 100 MG tablet  Commonly known as:  COZAAR  Take 50 mg by mouth daily.     Magnesium 500 MG Tabs  Take 1 tablet by mouth 2 (two) times daily.     metFORMIN 500 MG 24 hr tablet  Commonly known as:  GLUCOPHAGE-XR  Take 500 mg by mouth daily with breakfast.     tiotropium 18 MCG inhalation capsule  Commonly known as:  SPIRIVA  Place 18 mcg into inhaler and inhale daily as needed.     Vitamin D3 2000 UNITS Tabs  Take 3 tablets by mouth daily. 6000u per day     Allergies  Allergen Reactions  . Ace Inhibitors Cough  . Amoxil [Amoxicillin] Diarrhea  . Bisoprolol     Decreased Heart Rate  . Ciprofloxacin Diarrhea  . Crestor [Rosuvastatin]     Elevated LFT's and Myalgias  . Doxycycline Diarrhea  . Enalapril Cough  . Lipitor [Atorvastatin]     Elevated LFT's and Myalgias  . Metformin And Related Diarrhea  . Prednisone     Intolerant to High Dose Prednisone  . Vicodin [Hydrocodone-Acetaminophen]     Increased Anxiety and Nervousness  . Acyclovir  And Related Rash   PMHx:   Past Medical History  Diagnosis Date  . CVA (cerebral vascular accident) 51  . Tobacco abuse   . Allergic rhinitis   . Hypertension   . Hyperlipidemia   . Elevated hemoglobin A1c measurement   . COPD (chronic obstructive pulmonary disease)   . Lymphocytosis     right arm  . ASCVD (arteriosclerotic cardiovascular disease)   . CVA (cerebral vascular accident)   . Atherosclerosis of aorta 2014    via CT AB/Pelvis- no AAA  . Ovarian cyst   . SVD (spontaneous vaginal delivery)     x 2  . Shortness of breath     occasional  . Diabetes mellitus without complication   . DJD (degenerative joint disease)     neck, lower back, feet, hands  . Breast cancer 1990    right  . 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 Eyes: Denies redness, blurred vision, diplopia, discharge, itchy, watery eyes.  ENT: Denies discharge, congestion, post nasal drip, epistaxis, sore throat, earache, hearing loss, dental pain, tinnitus, vertigo, sinus pain, snoring.  CV: Denies chest pain, palpitations, irregular heartbeat, syncope, dyspnea, diaphoresis, orthopnea, PND, claudication or edema. Respiratory: denies cough, dyspnea, DOE, pleurisy, hoarseness, laryngitis, wheezing.  Gastrointestinal: Denies dysphagia, odynophagia, heartburn, reflux, water brash, abdominal pain or cramps, nausea, vomiting, bloating, diarrhea, constipation, hematemesis, melena, hematochezia  or hemorrhoids. Genitourinary: Denies dysuria, frequency, urgency, nocturia, hesitancy, discharge, hematuria or flank pain. Musculoskeletal: Denies arthralgias, myalgias, stiffness, jt. swelling, pain, limping or strain/sprain.  Skin: Denies pruritus, rash, hives, warts, acne, eczema or change in skin lesion(s). Neuro: No weakness, tremor, incoordination, spasms, paresthesia or pain. Psychiatric: Denies confusion, memory loss or sensory loss. Endo: Denies change in weight, skin or hair change.  Heme/Lymph: No excessive bleeding, bruising or enlarged lymph nodes.  Exam:  BP 106/58  Pulse 70  Temp(Src) 98 F (36.7 C) (Temporal)  Resp 18  Ht 5' 3.5" (1.613 m)  Wt 159 lb (72.122 kg)  BMI 27.72 kg/m2  Appears well nourished and in no distress. Eyes: PERRLA, EOMs, conjunctiva no swelling or erythema. Sinuses: No frontal/maxillary tenderness ENT/Mouth: EAC's clear, TM's nl w/o erythema, bulging. Nares clear w/o erythema, swelling, exudates. Oropharynx clear without erythema or exudates. Oral hygiene is good. Tongue normal, non obstructing. Hearing intact.   Neck: Supple. Thyroid nl. Car 2+/2+ without bruits, nodes or JVD. Chest: Respirations nl with BS clear & equal w/o rales, rhonchi, wheezing or stridor.  Cor: Heart sounds normal w/ regular rate and rhythm without sig. murmurs, gallops, clicks, or rubs. Peripheral pulses normal and equal  without edema.  Abdomen: Soft & bowel sounds normal. Non-tender w/o guarding, rebound, hernias, masses, or organomegaly.  Lymphatics: Unremarkable.  Musculoskeletal: Full ROM all peripheral extremities, joint stability, 5/5 strength, and right favored limping gait.  Skin: Warm, dry without exposed rashes, lesions or ecchymosis apparent.  Neuro: Cranial nerves intact, reflexes equal bilaterally. Sensory-motor testing grossly intact. Tendon reflexes grossly intact & hyperactive on the right with mild right sided spasticity.  Pysch: Alert & oriented x 3. Insight and judgement nl & appropriate. No ideations.  Assessment and Plan:  1. Hypertension - Continue monitor blood pressure at home. Continue diet/meds same.  2. Hyperlipidemia - Continue diet/meds, exercise,& lifestyle modifications.  Continue monitor periodic cholesterol/liver & renal functions   3. Pre-Diabetes - Continue diet, exercise, lifestyle modifications. Monitor appropriate labs.  4. Vitamin D Deficiency - Continue supplementation.  5. ASCVD s/p CVA    Recommended regular exercise, BP monitoring, weight control, and discussed med and SE's. Recommended labs to assess and monitor clinical status. Further disposition pending results of labs.

## 2014-02-24 NOTE — Patient Instructions (Addendum)
Recommend the book "The END of DIETING" by Dr Baker Janus   and the book "The END of DIABETES " by Dr Excell Seltzer  At [facility] [email or web address] - get book & Audio CD's      Being diabetic has a  300% increased risk for heart attack, stroke, cancer, and alzheimer- type vascular dementia. It is very important that you work harder with diet by avoiding all foods that are white except chicken & fish. Avoid white rice (brown & wild rice is OK), white potatoes (sweetpotatoes in moderation is OK), White bread or wheat bread or anything made out of white flour like bagels, donuts, rolls, buns, biscuits, cakes, pastries, cookies, pizza crust, and pasta (made from white flour & egg whites) - vegetarian pasta or spinach or wheat pasta is OK. Multigrain breads like Arnold's or [employer], or multigrain sandwich thins or flatbreads.  Diet, exercise and weight loss can reverse and cure diabetes in the early stages.  Diet, exercise and weight loss is very important in the control and prevention of complications of diabetes which affects every system in your body, ie. Brain - dementia/stroke, eyes - glaucoma/blindness, heart - heart attack/heart failure, kidneys - dialysis, stomach - gastric paralysis, intestines - malabsorption, nerves - severe painful neuritis, circulation - gangrene & loss of a leg(s), and finally cancer and Alzheimers.    I recommend avoid fried & greasy foods,  sweets/candy, white rice (brown or wild rice or Quinoa is OK), white potatoes (sweet potatoes are OK) - anything made from white flour - bagels, doughnuts, rolls, buns, biscuits,white and wheat breads, pizza crust and traditional pasta made of white flour & egg white(vegetarian pasta or spinach or wheat pasta is OK).  Multi-grain bread is OK - like multi-grain flat bread or sandwich thins. Avoid alcohol in excess. Exercise is also important.    Eat all the vegetables you want - avoid meat, especially red meat and dairy - especially cheese.  Cheese  is the most concentrated form of trans-fats which is the worst thing to clog up our arteries. Veggie cheese is OK which can be found in the fresh produce section at [employer] or [employer] or [employer]  Smoking Cessation, Tips for Success If you are ready to quit smoking, congratulations! You have chosen to help yourself be healthier. Cigarettes bring nicotine, tar, carbon monoxide, and other irritants into your body. Your lungs, heart, and blood vessels will be able to work better without these poisons. There are many different ways to quit smoking. Nicotine gum, nicotine patches, a nicotine inhaler, or nicotine nasal spray can help with physical craving. Hypnosis, support groups, and medicines help break the habit of smoking. WHAT THINGS CAN I DO TO MAKE QUITTING EASIER?  Here are some tips to help you quit for good:  Pick a date when you will quit smoking completely. Tell all of your friends and family about your plan to quit on that date.  Do not try to slowly cut down on the number of cigarettes you are smoking. Pick a quit date and quit smoking completely starting on that day.  Throw away all cigarettes.   Clean and remove all ashtrays from your home, work, and car.  On a card, write down your reasons for quitting. Carry the card with you and read it when you get the urge to smoke.  Cleanse your body of nicotine. Drink enough water and fluids to keep your urine clear or pale yellow. Do this after quitting to flush the  nicotine from your body.  Learn to predict your moods. Do not let a bad situation be your excuse to have a cigarette. Some situations in your life might tempt you into wanting a cigarette.  Never have "just one" cigarette. It leads to wanting another and another. Remind yourself of your decision to quit.  Change habits associated with smoking. If you smoked while driving or when feeling stressed, try other activities to replace smoking. Stand up when drinking your  coffee. Brush your teeth after eating. Sit in a different chair when you read the paper. Avoid alcohol while trying to quit, and try to drink fewer caffeinated beverages. Alcohol and caffeine may urge you to smoke.  Avoid foods and drinks that can trigger a desire to smoke, such as sugary or spicy foods and alcohol.  Ask people who smoke not to smoke around you.  Have something planned to do right after eating or having a cup of coffee. For example, plan to take a walk or exercise.  Try a relaxation exercise to calm you down and decrease your stress. Remember, you may be tense and nervous for the first 2 weeks after you quit, but this will pass.  Find new activities to keep your hands busy. Play with a pen, coin, or rubber band. Doodle or draw things on paper.  Brush your teeth right after eating. This will help cut down on the craving for the taste of tobacco after meals. You can also try mouthwash.   Use oral substitutes in place of cigarettes. Try using lemon drops, carrots, cinnamon sticks, or chewing gum. Keep them handy so they are available when you have the urge to smoke.  When you have the urge to smoke, try deep breathing.  Designate your home as a nonsmoking area.  If you are a heavy smoker, ask your health care provider about a prescription for nicotine chewing gum. It can ease your withdrawal from nicotine.  Reward yourself. Set aside the cigarette money you save and buy yourself something nice.  Look for support from others. Join a support group or smoking cessation program. Ask someone at home or at work to help you with your plan to quit smoking.  Always ask yourself, "Do I need this cigarette or is this just a reflex?" Tell yourself, "Today, I choose not to smoke," or "I do not want to smoke." You are reminding yourself of your decision to quit.  Do not replace cigarette smoking with electronic cigarettes (commonly called e-cigarettes). The safety of e-cigarettes is  unknown, and some may contain harmful chemicals.  If you relapse, do not give up! Plan ahead and think about what you will do the next time you get the urge to smoke. HOW WILL I FEEL WHEN I QUIT SMOKING? You may have symptoms of withdrawal because your body is used to nicotine (the addictive substance in cigarettes). You may crave cigarettes, be irritable, feel very hungry, cough often, get headaches, or have difficulty concentrating. The withdrawal symptoms are only temporary. They are strongest when you first quit but will go away within 10-14 days. When withdrawal symptoms occur, stay in control. Think about your reasons for quitting. Remind yourself that these are signs that your body is healing and getting used to being without cigarettes. Remember that withdrawal symptoms are easier to treat than the major diseases that smoking can cause.  Even after the withdrawal is over, expect periodic urges to smoke. However, these cravings are generally short lived and will go  away whether you smoke or not. Do not smoke! WHAT RESOURCES ARE AVAILABLE TO HELP ME QUIT SMOKING? Your health care provider can direct you to community resources or hospitals for support, which may include:  Group support.  Education.  Hypnosis.  Therapy. Document Released: 04/05/2004 Document Revised: 11/22/2013 Document Reviewed: 12/24/2012 [facility] Patient Information 2015 [place], [place]. This information is not intended to replace advice given to you by your health care provider. Make sure you discuss any questions you have with your health care provider.  Smoking Cessation, Tips for Success If you are ready to quit smoking, congratulations! You have chosen to help yourself be healthier. Cigarettes bring nicotine, tar, carbon monoxide, and other irritants into your body. Your lungs, heart, and blood vessels will be able to work better without these poisons. There are many different ways to quit smoking. Nicotine gum,  nicotine patches, a nicotine inhaler, or nicotine nasal spray can help with physical craving. Hypnosis, support groups, and medicines help break the habit of smoking. WHAT THINGS CAN I DO TO MAKE QUITTING EASIER?  Here are some tips to help you quit for good:  Pick a date when you will quit smoking completely. Tell all of your friends and family about your plan to quit on that date.  Do not try to slowly cut down on the number of cigarettes you are smoking. Pick a quit date and quit smoking completely starting on that day.  Throw away all cigarettes.   Clean and remove all ashtrays from your home, work, and car.  On a card, write down your reasons for quitting. Carry the card with you and read it when you get the urge to smoke.  Cleanse your body of nicotine. Drink enough water and fluids to keep your urine clear or pale yellow. Do this after quitting to flush the nicotine from your body.  Learn to predict your moods. Do not let a bad situation be your excuse to have a cigarette. Some situations in your life might tempt you into wanting a cigarette.  Never have "just one" cigarette. It leads to wanting another and another. Remind yourself of your decision to quit.  Change habits associated with smoking. If you smoked while driving or when feeling stressed, try other activities to replace smoking. Stand up when drinking your coffee. Brush your teeth after eating. Sit in a different chair when you read the paper. Avoid alcohol while trying to quit, and try to drink fewer caffeinated beverages. Alcohol and caffeine may urge you to smoke.  Avoid foods and drinks that can trigger a desire to smoke, such as sugary or spicy foods and alcohol.  Ask people who smoke not to smoke around you.  Have something planned to do right after eating or having a cup of coffee. For example, plan to take a walk or exercise.  Try a relaxation exercise to calm you down and decrease your stress. Remember, you may  be tense and nervous for the first 2 weeks after you quit, but this will pass.  Find new activities to keep your hands busy. Play with a pen, coin, or rubber band. Doodle or draw things on paper.  Brush your teeth right after eating. This will help cut down on the craving for the taste of tobacco after meals. You can also try mouthwash.   Use oral substitutes in place of cigarettes. Try using lemon drops, carrots, cinnamon sticks, or chewing gum. Keep them handy so they are available when you have the  urge to smoke.  When you have the urge to smoke, try deep breathing.  Designate your home as a nonsmoking area.  If you are a heavy smoker, ask your health care provider about a prescription for nicotine chewing gum. It can ease your withdrawal from nicotine.  Reward yourself. Set aside the cigarette money you save and buy yourself something nice.  Look for support from others. Join a support group or smoking cessation program. Ask someone at home or at work to help you with your plan to quit smoking.  Always ask yourself, "Do I need this cigarette or is this just a reflex?" Tell yourself, "Today, I choose not to smoke," or "I do not want to smoke." You are reminding yourself of your decision to quit.  Do not replace cigarette smoking with electronic cigarettes (commonly called e-cigarettes). The safety of e-cigarettes is unknown, and some may contain harmful chemicals.  If you relapse, do not give up! Plan ahead and think about what you will do the next time you get the urge to smoke. HOW WILL I FEEL WHEN I QUIT SMOKING? You may have symptoms of withdrawal because your body is used to nicotine (the addictive substance in cigarettes). You may crave cigarettes, be irritable, feel very hungry, cough often, get headaches, or have difficulty concentrating. The withdrawal symptoms are only temporary. They are strongest when you first quit but will go away within 10-14 days. When withdrawal symptoms  occur, stay in control. Think about your reasons for quitting. Remind yourself that these are signs that your body is healing and getting used to being without cigarettes. Remember that withdrawal symptoms are easier to treat than the major diseases that smoking can cause.  Even after the withdrawal is over, expect periodic urges to smoke. However, these cravings are generally short lived and will go away whether you smoke or not. Do not smoke! WHAT RESOURCES ARE AVAILABLE TO HELP ME QUIT SMOKING? Your health care provider can direct you to community resources or hospitals for support, which may include:  Group support.  Education.  Hypnosis.  Therapy. Document Released: 04/05/2004 Document Revised: 11/22/2013 Document Reviewed: 12/24/2012 [facility] Patient Information 2015 [place], [place]. This information is not intended to replace advice given to you by your health care provider. Make sure you discuss any questions you have with your health care provider.

## 2014-02-25 LAB — BASIC METABOLIC PANEL WITH GFR
BUN: 13 mg/dL (ref 6–23)
CO2: 24 mEq/L (ref 19–32)
CREATININE: 0.83 mg/dL (ref 0.50–1.10)
Calcium: 10.1 mg/dL (ref 8.4–10.5)
Chloride: 104 mEq/L (ref 96–112)
GFR, EST NON AFRICAN AMERICAN: 71 mL/min
GFR, Est African American: 81 mL/min
GLUCOSE: 114 mg/dL — AB (ref 70–99)
Potassium: 4 mEq/L (ref 3.5–5.3)
Sodium: 139 mEq/L (ref 135–145)

## 2014-02-25 LAB — HEPATIC FUNCTION PANEL
ALBUMIN: 4.3 g/dL (ref 3.5–5.2)
ALT: 27 U/L (ref 0–35)
AST: 23 U/L (ref 0–37)
Alkaline Phosphatase: 61 U/L (ref 39–117)
Bilirubin, Direct: 0.1 mg/dL (ref 0.0–0.3)
Indirect Bilirubin: 0.3 mg/dL (ref 0.2–1.2)
Total Bilirubin: 0.4 mg/dL (ref 0.2–1.2)
Total Protein: 7 g/dL (ref 6.0–8.3)

## 2014-02-25 LAB — HEMOGLOBIN A1C
HEMOGLOBIN A1C: 6.2 % — AB (ref <5.7)
MEAN PLASMA GLUCOSE: 131 mg/dL — AB (ref <117)

## 2014-02-25 LAB — INSULIN, FASTING: INSULIN FASTING, SERUM: 93 u[IU]/mL — AB (ref 3–28)

## 2014-02-25 LAB — LIPID PANEL
Cholesterol: 174 mg/dL (ref 0–200)
HDL: 45 mg/dL (ref >39)
LDL CALC: 79 mg/dL (ref 0–99)
TRIGLYCERIDES: 252 mg/dL — AB (ref <150)
Total CHOL/HDL Ratio: 3.9 Ratio
VLDL: 50 mg/dL — ABNORMAL HIGH (ref 0–40)

## 2014-02-25 LAB — MAGNESIUM: Magnesium: 2.2 mg/dL (ref 1.5–2.5)

## 2014-02-25 LAB — TSH: TSH: 2.324 u[IU]/mL (ref 0.350–4.500)

## 2014-02-26 LAB — VITAMIN D 25 HYDROXY (VIT D DEFICIENCY, FRACTURES): Vit D, 25-Hydroxy: 46 ng/mL (ref 30–89)

## 2014-02-28 ENCOUNTER — Other Ambulatory Visit: Payer: Self-pay | Admitting: *Deleted

## 2014-02-28 MED ORDER — FUROSEMIDE 20 MG PO TABS: 20.0000 mg | ORAL_TABLET | Freq: Every day | ORAL | Status: DC

## 2014-03-15 ENCOUNTER — Ambulatory Visit (HOSPITAL_COMMUNITY): Payer: Medicare HMO

## 2014-03-30 ENCOUNTER — Ambulatory Visit (HOSPITAL_COMMUNITY)
Admission: RE | Admit: 2014-03-30 | Discharge: 2014-03-30 | Disposition: A | Discharge status: Home / Self Care | Payer: Medicare HMO | Source: Ambulatory Visit | Attending: Internal Medicine | Admitting: Internal Medicine

## 2014-03-30 ENCOUNTER — Other Ambulatory Visit: Payer: Self-pay | Admitting: Internal Medicine

## 2014-03-30 DIAGNOSIS — Z1231 Encounter for screening mammogram for malignant neoplasm of breast: Secondary | ICD-10-CM | POA: Insufficient documentation

## 2014-03-30 DIAGNOSIS — N6019 Diffuse cystic mastopathy of unspecified breast: Secondary | ICD-10-CM

## 2014-04-01 ENCOUNTER — Other Ambulatory Visit: Payer: Self-pay | Admitting: Internal Medicine

## 2014-04-01 DIAGNOSIS — R928 Other abnormal and inconclusive findings on diagnostic imaging of breast: Secondary | ICD-10-CM

## 2014-04-11 ENCOUNTER — Ambulatory Visit
Admission: RE | Admit: 2014-04-11 | Discharge: 2014-04-11 | Disposition: A | Discharge status: Home / Self Care | Payer: Medicare HMO | Source: Ambulatory Visit | Attending: Internal Medicine | Admitting: Internal Medicine

## 2014-04-11 DIAGNOSIS — R928 Other abnormal and inconclusive findings on diagnostic imaging of breast: Secondary | ICD-10-CM

## 2014-04-12 ENCOUNTER — Other Ambulatory Visit: Payer: Self-pay

## 2014-04-12 ENCOUNTER — Other Ambulatory Visit: Payer: Self-pay | Admitting: Internal Medicine

## 2014-04-12 DIAGNOSIS — N6320 Unspecified lump in the left breast, unspecified quadrant: Secondary | ICD-10-CM

## 2014-04-12 DIAGNOSIS — Z9011 Acquired absence of right breast and nipple: Secondary | ICD-10-CM

## 2014-04-12 DIAGNOSIS — Z853 Personal history of malignant neoplasm of breast: Secondary | ICD-10-CM

## 2014-05-06 ENCOUNTER — Ambulatory Visit: Payer: Medicare HMO | Admitting: Internal Medicine

## 2014-05-17 ENCOUNTER — Other Ambulatory Visit: Payer: Self-pay | Admitting: Internal Medicine

## 2014-06-07 ENCOUNTER — Ambulatory Visit (INDEPENDENT_AMBULATORY_CARE_PROVIDER_SITE_OTHER): Payer: Medicare HMO | Admitting: Internal Medicine

## 2014-06-07 ENCOUNTER — Encounter: Payer: Self-pay | Admitting: Internal Medicine

## 2014-06-07 VITALS — BP 130/70 | HR 64 | Temp 98.6°F | Resp 16 | Ht 63.5 in | Wt 158.0 lb

## 2014-06-07 DIAGNOSIS — Z1212 Encounter for screening for malignant neoplasm of rectum: Secondary | ICD-10-CM

## 2014-06-07 DIAGNOSIS — I2510 Atherosclerotic heart disease of native coronary artery without angina pectoris: Secondary | ICD-10-CM

## 2014-06-07 DIAGNOSIS — R6889 Other general symptoms and signs: Secondary | ICD-10-CM

## 2014-06-07 DIAGNOSIS — Z79899 Other long term (current) drug therapy: Secondary | ICD-10-CM

## 2014-06-07 DIAGNOSIS — N189 Chronic kidney disease, unspecified: Secondary | ICD-10-CM

## 2014-06-07 DIAGNOSIS — Z23 Encounter for immunization: Secondary | ICD-10-CM

## 2014-06-07 DIAGNOSIS — Z111 Encounter for screening for respiratory tuberculosis: Secondary | ICD-10-CM

## 2014-06-07 DIAGNOSIS — E1122 Type 2 diabetes mellitus with diabetic chronic kidney disease: Secondary | ICD-10-CM

## 2014-06-07 DIAGNOSIS — I10 Essential (primary) hypertension: Secondary | ICD-10-CM

## 2014-06-07 DIAGNOSIS — E559 Vitamin D deficiency, unspecified: Secondary | ICD-10-CM

## 2014-06-07 DIAGNOSIS — I6789 Other cerebrovascular disease: Secondary | ICD-10-CM

## 2014-06-07 DIAGNOSIS — E119 Type 2 diabetes mellitus without complications: Secondary | ICD-10-CM | POA: Insufficient documentation

## 2014-06-07 DIAGNOSIS — Z1331 Encounter for screening for depression: Secondary | ICD-10-CM

## 2014-06-07 DIAGNOSIS — E782 Mixed hyperlipidemia: Secondary | ICD-10-CM

## 2014-06-07 DIAGNOSIS — Z0001 Encounter for general adult medical examination with abnormal findings: Secondary | ICD-10-CM

## 2014-06-07 DIAGNOSIS — Z9181 History of falling: Secondary | ICD-10-CM

## 2014-06-07 NOTE — Progress Notes (Signed)
Patient ID: [name], female   DOB: 1941/03/22, 73 y.o.   MRN: [id number]  MEDICARE ANNUAL WELLNESS & PREVENTATIVE VISIT AND CPE  Assessment:   1. Encounter for general adult medical examination with abnormal findings   2. Essential hypertension  - Microalbumin / creatinine urine ratio - EKG 12-Lead - [place], RETROPERITNL ABD,  LTD - TSH  3. Hyperlipidemia  - Lipid panel  4. Type 2 diabetes mellitus with diabetic chronic kidney disease  - Hemoglobin A1c - Insulin, fasting  5. Vitamin D deficiency  - Vit D  25 hydroxy  6. 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 Screening for rectal cancer  - POC Hemoccult Bld/Stl   9. Medication management  - Urine Microscopic - CBC with Differential - BASIC METABOLIC PANEL WITH GFR - Hepatic function panel - Magnesium  10. At moderate risk for fall  - Due to mild Right Hemi-paresis  11. Screening for depression  - Negative  12. Need for tuberculosis vaccination  - TB Skin Test  13. Need for prophylactic vaccination and inoculation against influenza  - Flu vaccine HIGH DOSE PF  14. Need for pneumococcal vaccination  - Pneumococcal conjugate vaccine 13-valent IM  15. 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 Hepatitis B, ped/adol 12/17/2013, 01/17/2014  . Influenza Split 05/17/2011,  04/21/2012  . Influenza Whole 04/23/2009, 04/13/2010  . Influenza,inj,Quad PF,36+ Mos 04/06/2013  . Pneumococcal Polysaccharide-23 05/17/2011   HD Influenza  06/07/2014   Prevnar 13  06/07/2014  . Td 07/23/2007   Nutrition assessed and recommended  Colonoscopy 10/19/2013 Recommended yearly ophthalmology/optometry visit for glaucoma screening and checkup Recommended yearly dental visit for hygiene and checkup Advanced directives - No - (Info given to patient tday )  Conditions/risks identified: BMI: Discussed weight loss, diet, and increase physical activity.  Increase physical activity: AHA recommends 150 minutes of physical activity a week.  Medications reviewed Diabetes is near goal, ACE/ARB therapy: Yes. Urinary Incontinence is not an issue: discussed non pharmacology and pharmacology options.  Fall risk: moderate- discussed PT, home fall assessment, medications.   Subjective:   [name] is a 73 y.o. MWF who presents for Medicare Annual Wellness Visit and complete physical.  Date of last medicare wellness visit is unknown.  She has had elevated blood pressure since 1991. Her blood pressure has been controlled at home, & today their BP is BP: 130/70 mmHg. 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  She does some walking. She denies chest pain, shortness of breath, dizziness.  She is on cholesterol medication and denies myalgias. Her cholesterol is at goal. The cholesterol last visit was:  Lab Results  Component Value Date   CHOL 174 02/24/2014   HDL 45 02/24/2014   LDLCALC 79 02/24/2014   TRIG 252* 02/24/2014   CHOLHDL 3.9 02/24/2014   She has prediabetes predating since Dec 2010 with A1c 5.9% and  she has had diabetes for 3 years (Mar 2012).  She has been working on diet and exercise and denies foot ulcerations, hyperglycemia, paresthesia of the feet, polydipsia and weight loss. 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  Last A1C in the office was:  Lab Results  Component Value Date   HGBA1C 6.2* 02/24/2014   Patient is on Vitamin D supplement. Vitamin D deficiency was discovered in 2008 (Vit D = 12)  Lab Results  Component Value Date   VD25OH 46 02/24/2014      Names of Other Physician/Practitioners you currently use: 1. [facility] here for primary care 2. Dr Marica Otter OD, eye doctor, last visit 2015 3. No dentist, has dentures  Patient Care Team: [name], MD as PCP - General (Internal Medicine) Inda Castle, MD as Consulting Physician (Gastroenterology)  Medication Review: Medication Sig  . albuterol  HFA inhaler Inhale 2 puffs  every 6 hours as needed  . aspirin 81 MG tab Take 81 mg by mouth daily.    Marland Kitchen VITAMIN D 2000 UNITS TABS 6000 u per day  . fenofibrate  134 MG cap TAKE ONE CAP ONCE DAILY  . fish oil 1000 MG cap Take 1 capsule by mouth 3 (three) times daily.   Marland Kitchen FLAX SEED OIL1000 MG CAPS Take 1 capsule by mouth 3 (three) times daily.    . fluticasone (FLONASE)  nasal spray Place 2 sprays into both nostrils daily as needed for allergies or rhinitis.  . furosemide  20 MG tab Take 1 tablet (20 mg total) by mouth daily.  Marland Kitchen losartan  100 MG tab Take 50 mg by mouth daily.  . Magnesium 500 MG TABS Take 1 tablet by mouth 2 (two) times daily.   . metFORMIN -XR)500 MG 24 hr tab TAKE FOUR TABLETS BY MOUTH ONCE DAILY FOR  BLOOD  SUGAR  . SPIRIVA)18 MCG  Place 18 mcg into inhaler and inhale daily as needed.   Current Facility-Administered Medications on File Prior to Visit  Medication Dose Route Frequency Provider Last Rate Last Dose  . hepatitis B vac  (RECOMBIVAX)   0.5 mL Intramuscular Once Inda Castle, MD       Current Problems (verified) Patient Active Problem List   Diagnosis Date Noted  . Type II diabetes mellitus with renal manifestations 06/07/2014  . Medication management  11/23/2013  . Vitamin D deficiency 11/23/2013  . Ovarian cyst 10/11/2013  . Cirrhosis of liver without mention of alcohol 10/01/2013  . Diverticulosis of colon without hemorrhage 08/24/2013  . Breast cancer   . Lymphocytosis   . DJD (degenerative joint disease)   . ASCVD (arteriosclerotic cardiovascular disease)   . Seasonal and perennial allergic rhinitis 01/18/2010  . Hyperlipidemia 01/12/2010  . TOBACCO USER 01/12/2010  . Essential hypertension 01/12/2010  . C V A / STROKE 01/12/2010  . COPD 01/12/2010   Screening Tests Health Maintenance  Topic Date Due  . FOOT EXAM  04/24/1951  . OPHTHALMOLOGY EXAM  04/24/1951  . ZOSTAVAX  04/23/2001  . INFLUENZA VACCINE  02/19/2014  . URINE MICROALBUMIN  05/31/2014  . HEMOGLOBIN A1C  08/27/2014  . MAMMOGRAM  04/11/2016  . TETANUS/TDAP  07/22/2017  . COLONOSCOPY  10/20/2023  . PNEUMOCOCCAL POLYSACCHARIDE VACCINE AGE 80 AND OVER  Completed   Immunization History  Administered Date(s) Administered  . Hepatitis B, ped/adol 12/17/2013, 01/17/2014  . Influenza Split 05/17/2011, 04/21/2012  . Influenza Whole  04/23/2009, 04/13/2010   HD Influenza 06/07/2014  . Influenza,inj,Quad PF,36+ Mos 04/06/2013  . Pneumococcal Polysaccharide-23 05/17/2011   Prevnar -13 06/07/2014  . DT 07/23/2007   Preventative care: Last colonoscopy: 10/19/2013 - Dr Deatra Ina  Prior vaccinations: Tdap: 12/24/2011  Influenza: HD 06/07/2014  Pneumococcal: 1996 Prevnar: 06/07/2014 Shingles/Zostavax: declined  History reviewed: allergies, current medications, past family history, past medical history, past social history, past surgical history and problem list   Risk Factors: Tobacco History  Substance Use Topics  . Smoking status: Current Every Day Smoker -- 0.50 packs/day for 55 years    Types: Cigarettes  . Smokeless tobacco: Current User     Comment: Electronic cig as well @ 0.6mg  of e cig only  . Alcohol Use: No   She does smoke.  Are there smokers in  your home (other than you)?  Yes  Alcohol Current alcohol use: none  Caffeine Current caffeine use: coffee 2 cups /day  Exercise Current exercise: walking  Nutrition/Diet Current diet: in general, a "healthy" diet    Cardiac risk factors: advanced age (older than 74 for men, 77 for women), diabetes mellitus, dyslipidemia, hypertension, obesity and sedentary lifestyle.  Depression Screen (Note: if answer to either of the following is "Yes", a more complete depression screening is indicated)   Q1: Over the past two weeks, have you felt down, depressed or hopeless? No  Q2: Over the past two weeks, have you felt little interest or pleasure in doing things? No  Have you lost interest or pleasure in daily life? No  Do you often feel hopeless? No  Do you cry easily over simple problems? No  Activities of Daily Living In your present state of health, do you have any difficulty performing the following activities?:  Driving? No Managing money?  No Feeding yourself? No Getting from bed to chair? No Climbing a flight of stairs? No Preparing food and eating?: No Bathing or showering? No Getting dressed: No Getting to the toilet? No Using the toilet:No Moving around from place to place: No In the past year have you fallen or had a near fall?:No   Are you sexually active?  No  Do you have more than one partner?  No  Vision Difficulties: No  Hearing Difficulties: No Do you often ask people to speak up or repeat themselves? No Do you experience ringing or noises in your ears? No Do you have difficulty understanding soft or whispered voices? Sometimes.  Cognition  Do you feel that you have a problem with memory?No  Do you often misplace items? No  Do you feel safe at home?  Yes  Advanced directives Does patient have a [place]? No Does patient have a Living Will? No (given forms today)  Systems Review : In addition to the HPI above,  No Fever-chills,   No Headache, No changes with hearing,  No problems swallowing food or Liquids,  No Chest pain or productive Cough or Shortness of Breath,  No Abdominal pain, No Nausea or Vomitting, Bowel movements are regular,  No Blood in stool or Urine,  No dysuria,  No new skin rashes or bruises,  No new joints pains-aches,  No new weakness, tingling, numbness in any extremity,  No recent weight loss,  No polyuria, polydypsia or polyphagia,  No significant Mental Stressors.  A full 10 point Review of Systems was done, except as stated above, all other Review of Systems were negative  Objective:     BP  130/70,  pulse 64, temp   98.6 F , resp  16,  ht 5' 3.5" , wt 158 lb  BMI 27.55   General appearance: alert, no distress, WD/WN, female Cognitive Testing  Alert? Yes  Normal Appearance?Yes  Oriented to person? Yes  Place? Yes   Time? Yes  Recall of three objects?  Yes  Can perform simple calculations? Yes  Displays appropriate judgment? Yes  Can read the correct time from a watch/clock?Yes  HEENT: normocephalic, sclerae anicteric, TMs pearly, nares patent, no discharge or erythema, pharynx normal. Counts fingers with peripheral vision, but central vision fails hand motion. Oral cavity: MMM, no lesions Neck: supple, no lymphadenopathy, no thyromegaly, no masses Heart: RRR, normal S1, S2, no murmurs Lungs: CTA bilaterally, no wheezes, rhonchi, or rales Abdomen: +bs, soft, non tender, non distended, no masses, no hepatomegaly, no splenomegaly Musculoskeletal: nontender, no swelling, no obvious deformity Extremities: no edema, no cyanosis, no clubbing Pulses: 2+ symmetric, upper and lower extremities, normal cap refill Neurological: alert, oriented x 3, CN2-12 intact, strength normal upper extremities and lower extremities, MonoFilament testing intact to the toes and sensation normal throughout, DTRs 3+ Rt & 1+ Lt. Limping gait favoring the right. Mild flexion contracturing of Right extremities  -  Upper > Lower. Psychiatric: normal affect, behavior normal, pleasant   Medicare Attestation I have personally reviewed: The patient's medical and social history Their use of alcohol, tobacco or illicit drugs Their current medications and supplements The patient's functional ability including ADLs,fall risks, home safety risks, cognitive, and hearing and visual impairment Diet and physical activities Evidence for depression or mood disorders  The patient's weight, height, BMI, and visual acuity have been recorded in the chart.  I have made referrals, counseling, and provided education to the patient based on review of the above and I have provided the patient with a written personalized care plan for preventive services.    Jakyla Reza DAVID, MD   06/07/2014

## 2014-06-07 NOTE — Patient Instructions (Signed)
 Recommend the book "The END of DIETING" by Dr Joel Furman   and the book "The END of DIABETES " by Dr Joel Fuhrman  At [employer] - get book & Audio CD's      Being diabetic has a  300% increased risk for heart attack, stroke, cancer, and alzheimer- type vascular dementia. It is very important that you work harder with diet by avoiding all foods that are white except chicken & fish. Avoid white rice (brown & wild rice is OK), white potatoes (sweetpotatoes in moderation is OK), White bread or wheat bread or anything made out of white flour like bagels, donuts, rolls, buns, biscuits, cakes, pastries, cookies, pizza crust, and pasta (made from white flour & egg whites) - vegetarian pasta or spinach or wheat pasta is OK. Multigrain breads like Arnold's or [employer], or multigrain sandwich thins or flatbreads.  Diet, exercise and weight loss can reverse and cure diabetes in the early stages.  Diet, exercise and weight loss is very important in the control and prevention of complications of diabetes which affects every system in your body, ie. Brain - dementia/stroke, eyes - glaucoma/blindness, heart - heart attack/heart failure, kidneys - dialysis, stomach - gastric paralysis, intestines - malabsorption, nerves - severe painful neuritis, circulation - gangrene & loss of a leg(s), and finally cancer and Alzheimers.    I recommend avoid fried & greasy foods,  sweets/candy, white rice (brown or wild rice or Quinoa is OK), white potatoes (sweet potatoes are OK) - anything made from white flour - bagels, doughnuts, rolls, buns, biscuits,white and wheat breads, pizza crust and traditional pasta made of white flour & egg white(vegetarian pasta or spinach or wheat pasta is OK).  Multi-grain bread is OK - like multi-grain flat bread or sandwich thins. Avoid alcohol in excess. Exercise is also important.    Eat all the vegetables you want - avoid meat, especially red meat and dairy - especially cheese.  Cheese  is the most concentrated form of trans-fats which is the worst thing to clog up our arteries. Veggie cheese is OK which can be found in the fresh produce section at [employer] or [employer] or [employer]  Preventive Care for Adults A healthy lifestyle and preventive care can promote health and wellness. Preventive health guidelines for women include the following key practices.  A routine yearly physical is a good way to check with your health care provider about your health and preventive screening. It is a chance to share any concerns and updates on your health and to receive a thorough exam.  Visit your dentist for a routine exam and preventive care every 6 months. Brush your teeth twice a day and floss once a day. Good oral hygiene prevents tooth decay and gum disease.  The frequency of eye exams is based on your age, health, family medical history, use of contact lenses, and other factors. Follow your health care provider's recommendations for frequency of eye exams.  Eat a healthy diet. Foods like vegetables, fruits, whole grains, low-fat dairy products, and lean protein foods contain the nutrients you need without too many calories. Decrease your intake of foods high in solid fats, added sugars, and salt. Eat the right amount of calories for you.Get information about a proper diet from your health care provider, if necessary.  Regular physical exercise is one of the most important things you can do for your health. Most adults should get at least 150 minutes of moderate-intensity exercise (any activity that increases   your heart rate and causes you to sweat) each week. In addition, most adults need muscle-strengthening exercises on 2 or more days a week.  Maintain a healthy weight. The body mass index (BMI) is a screening tool to identify possible weight problems. It provides an estimate of body fat based on height and weight. Your health care provider can find your BMI and can help you  achieve or maintain a healthy weight.For adults 20 years and older:  A BMI below 18.5 is considered underweight.  A BMI of 18.5 to 24.9 is normal.  A BMI of 25 to 29.9 is considered overweight.  A BMI of 30 and above is considered obese.  Maintain normal blood lipids and cholesterol levels by exercising and minimizing your intake of saturated fat. Eat a balanced diet with plenty of fruit and vegetables. If your lipid or cholesterol levels are high, you are over 50, or you are at high risk for heart disease, you may need your cholesterol levels checked more frequently.Ongoing high lipid and cholesterol levels should be treated with medicines if diet and exercise are not working.  If you smoke, find out from your health care provider how to quit. If you do not use tobacco, do not start.  Lung cancer screening is recommended for adults aged 55-80 years who are at high risk for developing lung cancer because of a history of smoking. A yearly low-dose CT scan of the lungs is recommended for people who have at least a 30-pack-year history of smoking and are a current smoker or have quit within the past 15 years. A pack year of smoking is smoking an average of 1 pack of cigarettes a day for 1 year (for example: 1 pack a day for 30 years or 2 packs a day for 15 years). Yearly screening should continue until the smoker has stopped smoking for at least 15 years. Yearly screening should be stopped for people who develop a health problem that would prevent them from having lung cancer treatment.  Avoid use of street drugs. Do not share needles with anyone. Ask for help if you need support or instructions about stopping the use of drugs.  High blood pressure causes heart disease and increases the risk of stroke.  Ongoing high blood pressure should be treated with medicines if weight loss and exercise do not work.  If you are 55-79 years old, ask your health care provider if you should take aspirin to  prevent strokes.  Diabetes screening involves taking a blood sample to check your fasting blood sugar level. This should be done once every 3 years, after age 45, if you are within normal weight and without risk factors for diabetes. Testing should be considered at a younger age or be carried out more frequently if you are overweight and have at least 1 risk factor for diabetes.  Breast cancer screening is essential preventive care for women. You should practice "breast self-awareness." This means understanding the normal appearance and feel of your breasts and may include breast self-examination. Any changes detected, no matter how small, should be reported to a health care provider. Women in their 20s and 30s should have a clinical breast exam (CBE) by a health care provider as part of a regular health exam every 1 to 3 years. After age 40, women should have a CBE every year. Starting at age 40, women should consider having a mammogram (breast X-ray test) every year. Women who have a family history of breast cancer should   talk to their health care provider about genetic screening. Women at a high risk of breast cancer should talk to their health care providers about having an MRI and a mammogram every year.  Breast cancer gene (BRCA)-related cancer risk assessment is recommended for women who have family members with BRCA-related cancers. BRCA-related cancers include breast, ovarian, tubal, and peritoneal cancers. Having family members with these cancers may be associated with an increased risk for harmful changes (mutations) in the breast cancer genes BRCA1 and BRCA2. Results of the assessment will determine the need for genetic counseling and BRCA1 and BRCA2 testing.  Routine pelvic exams to screen for cancer are no longer recommended for nonpregnant women who are considered low risk for cancer of the pelvic organs (ovaries, uterus, and vagina) and who do not have symptoms. Ask your health care provider  if a screening pelvic exam is right for you.  If you have had past treatment for cervical cancer or a condition that could lead to cancer, you need Pap tests and screening for cancer for at least 20 years after your treatment. If Pap tests have been discontinued, your risk factors (such as having a new sexual partner) need to be reassessed to determine if screening should be resumed. Some women have medical problems that increase the chance of getting cervical cancer. In these cases, your health care provider may recommend more frequent screening and Pap tests.    Colorectal cancer can be detected and often prevented. Most routine colorectal cancer screening begins at the age of 50 years and continues through age 75 years. However, your health care provider may recommend screening at an earlier age if you have risk factors for colon cancer. On a yearly basis, your health care provider may provide home test kits to check for hidden blood in the stool. Use of a small camera at the end of a tube, to directly examine the colon (sigmoidoscopy or colonoscopy), can detect the earliest forms of colorectal cancer. Talk to your health care provider about this at age 50, when routine screening begins. Direct exam of the colon should be repeated every 5-10 years through age 75 years, unless early forms of pre-cancerous polyps or small growths are found.  Osteoporosis is a disease in which the bones lose minerals and strength with aging. This can result in serious bone fractures or breaks. The risk of osteoporosis can be identified using a bone density scan. Women ages 65 years and over and women at risk for fractures or osteoporosis should discuss screening with their health care providers. Ask your health care provider whether you should take a calcium supplement or vitamin D to reduce the rate of osteoporosis.  Menopause can be associated with physical symptoms and risks. Hormone replacement therapy is available to  decrease symptoms and risks. You should talk to your health care provider about whether hormone replacement therapy is right for you.  Use sunscreen. Apply sunscreen liberally and repeatedly throughout the day. You should seek shade when your shadow is shorter than you. Protect yourself by wearing long sleeves, pants, a wide-brimmed hat, and sunglasses year round, whenever you are outdoors.  Once a month, do a whole body skin exam, using a mirror to look at the skin on your back. Tell your health care provider of new moles, moles that have irregular borders, moles that are larger than a pencil eraser, or moles that have changed in shape or color.  Stay current with required vaccines (immunizations).  Influenza vaccine. All adults   should be immunized every year.  Tetanus, diphtheria, and acellular pertussis (Td, Tdap) vaccine. Pregnant women should receive 1 dose of Tdap vaccine during each pregnancy. The dose should be obtained regardless of the length of time since the last dose. Immunization is preferred during the 27th-36th week of gestation. An adult who has not previously received Tdap or who does not know her vaccine status should receive 1 dose of Tdap. This initial dose should be followed by tetanus and diphtheria toxoids (Td) booster doses every 10 years. Adults with an unknown or incomplete history of completing a 3-dose immunization series with Td-containing vaccines should begin or complete a primary immunization series including a Tdap dose. Adults should receive a Td booster every 10 years.    Zoster vaccine. One dose is recommended for adults aged 60 years or older unless certain conditions are present.    Pneumococcal 13-valent conjugate (PCV13) vaccine. When indicated, a person who is uncertain of her immunization history and has no record of immunization should receive the PCV13 vaccine. An adult aged 19 years or older who has certain medical conditions and has not been previously  immunized should receive 1 dose of PCV13 vaccine. This PCV13 should be followed with a dose of pneumococcal polysaccharide (PPSV23) vaccine. The PPSV23 vaccine dose should be obtained at least 8 weeks after the dose of PCV13 vaccine. An adult aged 19 years or older who has certain medical conditions and previously received 1 or more doses of PPSV23 vaccine should receive 1 dose of PCV13. The PCV13 vaccine dose should be obtained 1 or more years after the last PPSV23 vaccine dose.    Pneumococcal polysaccharide (PPSV23) vaccine. When PCV13 is also indicated, PCV13 should be obtained first. All adults aged 65 years and older should be immunized. An adult younger than age 65 years who has certain medical conditions should be immunized. Any person who resides in a nursing home or long-term care facility should be immunized. An adult smoker should be immunized. People with an immunocompromised condition and certain other conditions should receive both PCV13 and PPSV23 vaccines. People with human immunodeficiency virus (HIV) infection should be immunized as soon as possible after diagnosis. Immunization during chemotherapy or radiation therapy should be avoided. Routine use of PPSV23 vaccine is not recommended for American Indians, [place] Natives, or people younger than 65 years unless there are medical conditions that require PPSV23 vaccine. When indicated, people who have unknown immunization and have no record of immunization should receive PPSV23 vaccine. One-time revaccination 5 years after the first dose of PPSV23 is recommended for people aged 19-64 years who have chronic kidney failure, nephrotic syndrome, asplenia, or immunocompromised conditions. People who received 1-2 doses of PPSV23 before age 65 years should receive another dose of PPSV23 vaccine at age 65 years or later if at least 5 years have passed since the previous dose. Doses of PPSV23 are not needed for people immunized with PPSV23 at or after  age 65 years.   Preventive Services / Frequency  Ages 65 years and over  Blood pressure check.  Lipid and cholesterol check.  Lung cancer screening. / Every year if you are aged 55-80 years and have a 30-pack-year history of smoking and currently smoke or have quit within the past 15 years. Yearly screening is stopped once you have quit smoking for at least 15 years or develop a health problem that would prevent you from having lung cancer treatment.  Clinical breast exam.** / Every year after age 40 years.    BRCA-related cancer risk assessment.** / For women who have family members with a BRCA-related cancer (breast, ovarian, tubal, or peritoneal cancers).  Mammogram.** / Every year beginning at age 40 years and continuing for as long as you are in good health. Consult with your health care provider.  Pap test.** / Every 3 years starting at age 30 years through age 65 or 70 years with 3 consecutive normal Pap tests. Testing can be stopped between 65 and 70 years with 3 consecutive normal Pap tests and no abnormal Pap or HPV tests in the past 10 years.  Fecal occult blood test (FOBT) of stool. / Every year beginning at age 50 years and continuing until age 75 years. You may not need to do this test if you get a colonoscopy every 10 years.  Flexible sigmoidoscopy or colonoscopy.** / Every 5 years for a flexible sigmoidoscopy or every 10 years for a colonoscopy beginning at age 50 years and continuing until age 75 years.  Hepatitis C blood test.** / For all people born from 1945 through 1965 and any individual with known risks for hepatitis C.  Osteoporosis screening.** / A one-time screening for women ages 65 years and over and women at risk for fractures or osteoporosis.  Skin self-exam. / Monthly.  Influenza vaccine. / Every year.  Tetanus, diphtheria, and acellular pertussis (Tdap/Td) vaccine.** / 1 dose of Td every 10 years.  Zoster vaccine.** / 1 dose for adults aged 60 years  or older.  Pneumococcal 13-valent conjugate (PCV13) vaccine.** / Consult your health care provider.  Pneumococcal polysaccharide (PPSV23) vaccine.** / 1 dose for all adults aged 65 years and older. Screening for abdominal aortic aneurysm (AAA)  by ultrasound is recommended for people who have history of high blood pressure or who are current or former smokers. 

## 2014-06-08 LAB — CBC WITH DIFFERENTIAL/PLATELET
BASOS PCT: 0 % (ref 0–1)
Basophils Absolute: 0 10*3/uL (ref 0.0–0.1)
EOS PCT: 2 % (ref 0–5)
Eosinophils Absolute: 0.2 10*3/uL (ref 0.0–0.7)
HCT: 39.4 % (ref 36.0–46.0)
Hemoglobin: 14.3 g/dL (ref 12.0–15.0)
LYMPHS PCT: 58 % — AB (ref 12–46)
Lymphs Abs: 5.5 10*3/uL — ABNORMAL HIGH (ref 0.7–4.0)
MCH: 32.8 pg (ref 26.0–34.0)
MCHC: 36.3 g/dL — ABNORMAL HIGH (ref 30.0–36.0)
MCV: 90.4 fL (ref 78.0–100.0)
MPV: 9.7 fL (ref 9.4–12.4)
Monocytes Absolute: 0.6 10*3/uL (ref 0.1–1.0)
Monocytes Relative: 6 % (ref 3–12)
Neutro Abs: 3.2 10*3/uL (ref 1.7–7.7)
Neutrophils Relative %: 34 % — ABNORMAL LOW (ref 43–77)
PLATELETS: 296 10*3/uL (ref 150–400)
RBC: 4.36 MIL/uL (ref 3.87–5.11)
RDW: 13 % (ref 11.5–15.5)
WBC: 9.4 10*3/uL (ref 4.0–10.5)

## 2014-06-08 LAB — BASIC METABOLIC PANEL WITH GFR
BUN: 14 mg/dL (ref 6–23)
CHLORIDE: 104 meq/L (ref 96–112)
CO2: 24 meq/L (ref 19–32)
Calcium: 10.1 mg/dL (ref 8.4–10.5)
Creat: 1.15 mg/dL — ABNORMAL HIGH (ref 0.50–1.10)
GFR, Est African American: 55 mL/min — ABNORMAL LOW
GFR, Est Non African American: 47 mL/min — ABNORMAL LOW
Glucose, Bld: 126 mg/dL — ABNORMAL HIGH (ref 70–99)
Potassium: 4 mEq/L (ref 3.5–5.3)
Sodium: 138 mEq/L (ref 135–145)

## 2014-06-08 LAB — HEMOGLOBIN A1C
HEMOGLOBIN A1C: 6.4 % — AB (ref <5.7)
MEAN PLASMA GLUCOSE: 137 mg/dL — AB (ref <117)

## 2014-06-08 LAB — TSH: TSH: 2.969 u[IU]/mL (ref 0.350–4.500)

## 2014-06-08 LAB — URINALYSIS, MICROSCOPIC ONLY
Casts: NONE SEEN
Crystals: NONE SEEN

## 2014-06-08 LAB — LIPID PANEL
Cholesterol: 176 mg/dL (ref 0–200)
HDL: 36 mg/dL — ABNORMAL LOW (ref >39)
LDL CALC: 82 mg/dL (ref 0–99)
Total CHOL/HDL Ratio: 4.9 Ratio
Triglycerides: 290 mg/dL — ABNORMAL HIGH (ref <150)
VLDL: 58 mg/dL — AB (ref 0–40)

## 2014-06-08 LAB — HEPATIC FUNCTION PANEL
ALK PHOS: 63 U/L (ref 39–117)
ALT: 37 U/L — ABNORMAL HIGH (ref 0–35)
AST: 33 U/L (ref 0–37)
Albumin: 4.3 g/dL (ref 3.5–5.2)
Bilirubin, Direct: 0.1 mg/dL (ref 0.0–0.3)
Indirect Bilirubin: 0.3 mg/dL (ref 0.2–1.2)
TOTAL PROTEIN: 7.3 g/dL (ref 6.0–8.3)
Total Bilirubin: 0.4 mg/dL (ref 0.2–1.2)

## 2014-06-08 LAB — MICROALBUMIN / CREATININE URINE RATIO
Creatinine, Urine: 59.4 mg/dL
MICROALB UR: 0.3 mg/dL (ref <2.0)
Microalb Creat Ratio: 5.1 mg/g (ref 0.0–30.0)

## 2014-06-08 LAB — MAGNESIUM: Magnesium: 2.1 mg/dL (ref 1.5–2.5)

## 2014-06-08 LAB — INSULIN, FASTING: Insulin fasting, serum: 66.3 u[IU]/mL — ABNORMAL HIGH (ref 2.0–19.6)

## 2014-06-08 LAB — VITAMIN D 25 HYDROXY (VIT D DEFICIENCY, FRACTURES): Vit D, 25-Hydroxy: 39 ng/mL (ref 30–100)

## 2014-06-13 ENCOUNTER — Ambulatory Visit: Payer: Medicare HMO | Admitting: Internal Medicine

## 2014-06-29 ENCOUNTER — Other Ambulatory Visit: Payer: Self-pay | Admitting: Physician Assistant

## 2014-07-19 ENCOUNTER — Ambulatory Visit (INDEPENDENT_AMBULATORY_CARE_PROVIDER_SITE_OTHER): Payer: Medicare HMO | Admitting: Gastroenterology

## 2014-07-19 DIAGNOSIS — K7030 Alcoholic cirrhosis of liver without ascites: Secondary | ICD-10-CM

## 2014-07-19 DIAGNOSIS — K5730 Diverticulosis of large intestine without perforation or abscess without bleeding: Secondary | ICD-10-CM

## 2014-07-19 DIAGNOSIS — Z23 Encounter for immunization: Secondary | ICD-10-CM

## 2014-07-19 DIAGNOSIS — K7460 Unspecified cirrhosis of liver: Secondary | ICD-10-CM

## 2014-07-22 HISTORY — PX: BREAST LUMPECTOMY: SHX2

## 2014-08-02 ENCOUNTER — Ambulatory Visit: Payer: Medicare HMO | Admitting: Internal Medicine

## 2014-08-25 ENCOUNTER — Encounter: Payer: Self-pay | Admitting: Internal Medicine

## 2014-08-25 ENCOUNTER — Ambulatory Visit (INDEPENDENT_AMBULATORY_CARE_PROVIDER_SITE_OTHER): Payer: Medicare HMO | Admitting: Internal Medicine

## 2014-08-25 ENCOUNTER — Ambulatory Visit (INDEPENDENT_AMBULATORY_CARE_PROVIDER_SITE_OTHER)
Admission: RE | Admit: 2014-08-25 | Discharge: 2014-08-25 | Disposition: A | Discharge status: Home / Self Care | Payer: Medicare HMO | Source: Ambulatory Visit | Attending: Internal Medicine | Admitting: Internal Medicine

## 2014-08-25 VITALS — BP 114/66 | HR 53 | Ht 64.0 in | Wt 160.6 lb

## 2014-08-25 DIAGNOSIS — Z720 Tobacco use: Secondary | ICD-10-CM

## 2014-08-25 DIAGNOSIS — J441 Chronic obstructive pulmonary disease with (acute) exacerbation: Secondary | ICD-10-CM

## 2014-08-25 DIAGNOSIS — F17200 Nicotine dependence, unspecified, uncomplicated: Secondary | ICD-10-CM

## 2014-08-25 DIAGNOSIS — J449 Chronic obstructive pulmonary disease, unspecified: Secondary | ICD-10-CM

## 2014-08-25 MED ORDER — LEVALBUTEROL HCL 0.63 MG/3ML IN NEBU
0.6300 mg | INHALATION_SOLUTION | Freq: Once | RESPIRATORY_TRACT | Status: AC
  Administered 2014-08-25: 0.63 mg via RESPIRATORY_TRACT

## 2014-08-25 MED ORDER — CEFDINIR 300 MG PO CAPS: 300.0000 mg | ORAL_CAPSULE | Freq: Two times a day (BID) | ORAL | Status: DC

## 2014-08-25 NOTE — Progress Notes (Signed)
Patient ID: [name], female    DOB: 04/13/1941, 74 y.o.   MRN: [id number]  HPI 74 yoF 1/2 PPD smoker returning for f/u of COPD and allergic rhinitis. PFT 2011 showed mild obstruction w/ severe reduction DLCO. Wears elastic sleeve right arm since remote mastectomy. Lat here September 23,2011. She and her husband shared a chest cold 1 month ago, resolved back to baseline w/o presciption med. She admits frequent wheeze. Gets short of breath on long walks, housework.  Uses Albuterol about 3 x/ daily, but hasn't used Advair saying "I didn't know what it was for". Little change day to day or since last here.  Having Spring seasonal nasal congestion and blowing for the past month. Eyes itch. Tried benadryl, but made her too sleepy.  Denies chest pains, blood or nodes. Occasional palpitation.  She would like to stop smoking, but hasn't tried even patches.   05/23/11- 74 yoF 1/2 PPD smoker returning for f/u of COPD and allergic rhinitis, complicated by history of CVA, breast cancer. She is trying to quit smoking. Now down to 2 or 3 cigarettes a day using an electronic cigarette instead and tried to taper the strength of that. She thinks her breathing is better. Only occasional albuterol, less than once per week. Had flu and pneumococcal vaccines and a TB skin test negative, this year. Right-sided chest and nose both seem the most congested. Occasionally, cough or blow out some phlegm. She denies blood or chest pain. CT chest 12/14/2009 showed changes of COPD, cardiac enlargement, no PE.  12/30/11- 74 yoF 1/2 PPD smoker returning for f/u of COPD and allergic rhinitis, complicated by history of CVA, breast cancer C/O cough with white mucus and wheezing since 5/29. Denies sob, chest pain, and chest tightness. Still smoking 5 or 6 cigarettes a day despite repeated counseling. After the winter comfortably. Blames the spring allergy for some nasal congestion. Saw her primary physician in May for bronchitis.  Doxycycline caused diarrhea. She completed prednisone taper. Now still has productive cough with white phlegm. Denies fever or adenopathy.  CXR 05/29/11- IMPRESSION:  Stable chest x-ray. No active lung disease.  Original Report Authenticated By: Joretta Bachelor, M.D.    08/03/12- 74 yoF 1/2 PPD smoker returning for f/u of COPD and allergic rhinitis, complicated by history of CVA, breast cancer/ lymphedema FOLLOWS FOR: had been doing good with breathing until today; started coughing-productive-yellow in color; SOB and wheezing today as well. No effort to stop smoking despite her discussions. Reports doing very well until the last day or so-new onset of postnasal drip, cough, scant yellow sputum without fever swollen glands or sore throat. Using her Spiriva about 3 times per week and lost her rescue inhaler. CXR 05/19/12 IMPRESSION:  Stable chest x-ray. No active lung disease. Prior right  mastectomy.  Original Report Authenticated By: Joretta Bachelor, M.D.   04/06/13- 74 yoF 1/2 PPD smoker returning for f/u of COPD and allergic rhinitis, complicated by history of CVA, breast cancer/ lymphedema FOLLOWS FOR:sob-same,cough-yellow,wheezing,denies cp and tightness No effort to stop smoking. Some sinus congestion. Rescue inhaler < 1x/ day.  11/04/13- 74 yoF 1/2 PPD smoker returning for f/u of COPD and allergic rhinitis, complicated by history of CVA, breast cancer/ lymphedema FOLLOWS FOR: breathing is fine but having flare up of allergies;cough-productive(yellow to green in color), Wheezing as well. Using E-cig 6 mg, not tobacco cigs now. Notices nasal congestion CXR 04/06/13 IMPRESSION:  No active cardiopulmonary disease.  Electronically Signed  By: Rolm Baptise  M.D.  On: 04/06/2013 14:49  08/25/14- 74 yoF 1/2 PPD smoker returning for f/u of COPD and allergic rhinitis, complicated by history of CVA, breast cancer/ lymphedema FOLLOWS FOR: Recent caught cold(last week) and is making her breathing worse.  Has had Zpak and OTC meds. Also, tried to take Prednisone and was unable to do so-nervous during the day and stays awake at night. Finished Z-Pak last week for chest congestion. Easy shortness of breath with exertion, cough productive yellow but no fever or blood. No effort to stop smoking but using E cigarettes more now. 5 or 6 tobacco cigarettes daily. Pro air and Spiriva are usually enough.  Review of Systems-See HPI Constitutional:   No-   weight loss, night sweats, fevers, chills, fatigue, lassitude. HEENT:   No-  headaches, difficulty swallowing, tooth/dental problems, sore throat,       No-  sneezing, itching, ear ache, +nasal congestion, +post nasal drip,  CV:  No-   chest pain, orthopnea, PND, swelling in lower extremities, anasarca, dizziness, palpitations Resp: +  shortness of breath with exertion or at rest.              No- productive cough,  + non-productive cough,  No- coughing up of blood.             No-change in color of mucus.  No- wheezing.   Skin: No-   rash or lesions. GI:  No-   heartburn, indigestion, abdominal pain, nausea, vomiting, GU: . MS:  No-   joint pain or swelling.   Neuro-     nothing unusual Psych:  No- change in mood or affect. No depression or anxiety.  No memory loss.  Objective:   Physical Exam  General- Alert, Oriented, Affect-appropriate, Distress- none acute, +odor of tobacco Skin- rash-none, lesions- none, excoriation- none Lymphadenopathy- none Head- atraumatic            Eyes- Gross vision intact, PERRLA, conjunctivae clear secretions            Ears- Hearing, canals-normal            Nose- +turbinate edema, no-Septal dev, mucus, polyps, erosion, perforation             Throat- Mallampati III , mucosa clear , drainage- none, tonsils- atrophic, dentures Neck- flexible , trachea midline, no stridor , thyroid nl, carotid no bruit Chest - symmetrical excursion , unlabored           Heart/CV- RRR , no murmur , no gallop  , no rub, nl s1 s2                            - JVD- none , edema- none, stasis changes- none, varices- none           Lung- +diminished breath sounds, wheeze-none, unlabored, +Loose cough, dullness-none, rub- none           Chest wall-  Abd-  Br/ Gen/ Rectal- Not done, not indicated Extrem- cyanosis- none, clubbing, none, atrophy- none, strength- nl. +Right                arm remains in the elastic sleeve. Neuro- grossly intact to observation

## 2014-08-25 NOTE — Patient Instructions (Signed)
Script sent for cefdinir antibiotic  Neb xop 0.63  Order CXR   Dx acute exacerbation of COPD

## 2014-09-01 ENCOUNTER — Other Ambulatory Visit: Payer: Self-pay | Admitting: Internal Medicine

## 2014-09-04 ENCOUNTER — Encounter: Payer: Self-pay | Admitting: Internal Medicine

## 2014-09-04 NOTE — Assessment & Plan Note (Signed)
Recent exacerbation consistent with infection Plan-nebulizer treatments Xopenex, chest x-ray, cefdinir

## 2014-09-04 NOTE — Assessment & Plan Note (Signed)
Multiple reasons for smoking cessation pointed out to her and offer of support with available community resources emphasized.

## 2014-09-07 ENCOUNTER — Ambulatory Visit (INDEPENDENT_AMBULATORY_CARE_PROVIDER_SITE_OTHER): Payer: Medicare HMO | Admitting: Physician Assistant

## 2014-09-07 ENCOUNTER — Encounter: Payer: Self-pay | Admitting: Physician Assistant

## 2014-09-07 VITALS — BP 122/68 | HR 74 | Temp 98.4°F | Resp 18 | Ht 63.5 in | Wt 161.0 lb

## 2014-09-07 DIAGNOSIS — I2510 Atherosclerotic heart disease of native coronary artery without angina pectoris: Secondary | ICD-10-CM

## 2014-09-07 DIAGNOSIS — R1032 Left lower quadrant pain: Secondary | ICD-10-CM

## 2014-09-07 DIAGNOSIS — J449 Chronic obstructive pulmonary disease, unspecified: Secondary | ICD-10-CM

## 2014-09-07 DIAGNOSIS — F17200 Nicotine dependence, unspecified, uncomplicated: Secondary | ICD-10-CM

## 2014-09-07 DIAGNOSIS — E782 Mixed hyperlipidemia: Secondary | ICD-10-CM

## 2014-09-07 DIAGNOSIS — E559 Vitamin D deficiency, unspecified: Secondary | ICD-10-CM

## 2014-09-07 DIAGNOSIS — E1122 Type 2 diabetes mellitus with diabetic chronic kidney disease: Secondary | ICD-10-CM

## 2014-09-07 DIAGNOSIS — I10 Essential (primary) hypertension: Secondary | ICD-10-CM

## 2014-09-07 DIAGNOSIS — Z79899 Other long term (current) drug therapy: Secondary | ICD-10-CM

## 2014-09-07 LAB — CBC WITH DIFFERENTIAL/PLATELET
Basophils Absolute: 0 10*3/uL (ref 0.0–0.1)
Basophils Relative: 0 % (ref 0–1)
EOS PCT: 2 % (ref 0–5)
Eosinophils Absolute: 0.2 10*3/uL (ref 0.0–0.7)
HCT: 39.5 % (ref 36.0–46.0)
Hemoglobin: 13.8 g/dL (ref 12.0–15.0)
Lymphocytes Relative: 49 % — ABNORMAL HIGH (ref 12–46)
Lymphs Abs: 5.2 10*3/uL — ABNORMAL HIGH (ref 0.7–4.0)
MCH: 32.6 pg (ref 26.0–34.0)
MCHC: 34.9 g/dL (ref 30.0–36.0)
MCV: 93.4 fL (ref 78.0–100.0)
MONOS PCT: 7 % (ref 3–12)
MPV: 10.5 fL (ref 8.6–12.4)
Monocytes Absolute: 0.7 10*3/uL (ref 0.1–1.0)
Neutro Abs: 4.5 10*3/uL (ref 1.7–7.7)
Neutrophils Relative %: 42 % — ABNORMAL LOW (ref 43–77)
Platelets: 248 10*3/uL (ref 150–400)
RBC: 4.23 MIL/uL (ref 3.87–5.11)
RDW: 13.9 % (ref 11.5–15.5)
WBC: 10.7 10*3/uL — AB (ref 4.0–10.5)

## 2014-09-07 MED ORDER — ACETAMINOPHEN-CODEINE #3 300-30 MG PO TABS: 1.0000 | ORAL_TABLET | Freq: Four times a day (QID) | ORAL | Status: DC | PRN

## 2014-09-07 NOTE — Progress Notes (Addendum)
Assessment and Plan:  Hypertension: Continue medication, monitor blood pressure at home. Continue DASH diet.  Reminder to go to the ER if any CP, SOB, nausea, dizziness, severe HA, changes vision/speech, left arm numbness and tingling and jaw pain. Cholesterol: Continue diet and exercise. Check cholesterol.  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 Check A1C Vitamin D Def- check level and continue medications.  Chronic pain- tylenol #3, very rare to use, will refill COPD- exacerbation better- will check WBC but lungs sound better LLQ pain- improving, no rebound, check CBC  Continue diet and meds as discussed. Further disposition pending results of labs. Discussed med's effects and SE's.    HPI 75 y.o. female  presents for 3 month follow up with hypertension, hyperlipidemia, diabetes and vitamin D.  Her blood pressure has been controlled at home, today their BP is BP: 122/68 mmHg.  She does not workout. She denies chest pain, shortness of breath, dizziness.  She is on cholesterol medication, fenofibrate, she is not on zetia due to cost and can not tolerate statins. Her cholesterol is not at goal. 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 Last A1C was: 06/07/2014: Hemoglobin-A1c 6.4*  Patient is on Vitamin D supplement. 06/07/2014: Vit D, 25-Hydroxy 39 Has acute COPD exacerbation, treated by Dr. Annamaria Boots on Feb the 4th. She continues to smoke but states that she is decreasing and trying to just use [employer].  She has a history of diverticulitis, has had some left lower quadrant pain, no diarrhea, went on  liquid diet and feels better.  She has chronic pain and uses tylenol #3 for back pain occ, mainly at night.   Current Medications:  Current Outpatient Prescriptions on File Prior to Visit  Medication Sig Dispense Refill  . albuterol (PROVENTIL HFA;VENTOLIN HFA) 108 (90 BASE) MCG/ACT inhaler Inhale 2 puffs into the lungs every 6 (six) hours as needed for wheezing or shortness of breath.    Marland Kitchen aspirin 81 MG tablet Take 81 mg by mouth daily.      . Cholecalciferol (VITAMIN D3) 2000 UNITS TABS Take 3 tablets by mouth daily. 6000u per day    . fenofibrate micronized (LOFIBRA) 134 MG capsule TAKE ONE CAPSULE BY MOUTH ONCE DAILY 90 capsule 0  . fish oil-omega-3 fatty acids 1000 MG capsule Take 1 capsule by mouth 3 (three) times daily.     . Flaxseed, Linseed, (FLAX SEED OIL) 1000 MG CAPS Take 1 capsule by mouth 3 (three) times daily.      . fluticasone (FLONASE) 50 MCG/ACT nasal spray Place 2 sprays into both nostrils daily as needed for allergies or rhinitis.    . furosemide (LASIX) 20 MG tablet Take 1 tablet (20 mg total) by mouth daily. 30 tablet 3  . losartan (COZAAR) 100 MG tablet TAKE ONE TABLET BY MOUTH ONCE DAILY 30 tablet 3  . Magnesium 500 MG TABS Take 1 tablet by mouth 2 (two) times daily.     . metFORMIN (GLUCOPHAGE-XR) 500 MG 24 hr tablet TAKE FOUR TABLETS BY MOUTH ONCE DAILY FOR  BLOOD  SUGAR 360 tablet 0  . tiotropium (SPIRIVA) 18 MCG inhalation capsule Place 18 mcg into inhaler and  inhale daily as needed.     No current facility-administered medications on file prior to visit.   Medical History:  Past Medical History  Diagnosis Date  . CVA (cerebral vascular accident) 57  . Tobacco abuse   . Allergic rhinitis   . Hypertension   . Hyperlipidemia   . Elevated hemoglobin A1c measurement   . COPD (chronic obstructive pulmonary disease)   . Lymphocytosis     right arm  . ASCVD (arteriosclerotic cardiovascular disease)   . CVA (cerebral vascular accident)   . Atherosclerosis of  aorta 2014    via CT AB/Pelvis- no AAA  . Ovarian cyst   . SVD (spontaneous vaginal delivery)     x 2  . Shortness of breath     occasional  . Diabetes mellitus without complication   . DJD (degenerative joint disease)     neck, lower back, feet, hands  . Breast cancer 1990    right  . Neuromuscular disorder     right arm/leg  weakness from stroke 1985   Allergies:  Allergies  Allergen Reactions  . Ace Inhibitors Cough  . Amoxil [Amoxicillin] Diarrhea  . Bisoprolol     Decreased Heart Rate  . Ciprofloxacin Diarrhea  . Crestor [Rosuvastatin]     Elevated LFT's and Myalgias  . Doxycycline Diarrhea  . Enalapril Cough  . Lipitor [Atorvastatin]     Elevated LFT's and Myalgias  . Metformin And Related Diarrhea  . Prednisone     Intolerant to High Dose Prednisone  . Vicodin [Hydrocodone-Acetaminophen]     Increased Anxiety and Nervousness  . Acyclovir And Related Rash     Review of Systems:  Review of Systems  Constitutional: Negative.   HENT: Negative.   Eyes: Negative.        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 Negative for hemoptysis.   Cardiovascular: Negative.   Gastrointestinal: Positive for abdominal pain, diarrhea and constipation. Negative for heartburn, nausea, vomiting, blood in stool and melena.  Genitourinary: Negative.   Musculoskeletal: Positive for myalgias and back pain. Negative for joint pain, falls and neck pain.  Skin: Negative.   Neurological: Negative.   Psychiatric/Behavioral: Negative.     Family history- Review and unchanged Social history- Review and unchanged Physical Exam: BP 122/68 mmHg  Pulse 74  Temp(Src) 98.4 F (36.9 C) (Temporal)  Resp 18  Ht 5' 3.5" (1.613 m)  Wt 161 lb (73.029 kg)  BMI 28.07 kg/m2 Wt Readings from Last 3 Encounters:  09/07/14 161 lb (73.029 kg)  08/25/14 160 lb 9.6 oz (72.848 kg)  06/07/14 158 lb (71.668 kg)   Eyes:  PERRLA, EOMs, conjunctiva no swelling or erythema Sinuses: No Frontal/maxillary tenderness ENT/Mouth: Ext aud canals clear, TMs without erythema, bulging. No erythema, swelling, or exudate on post pharynx.  Tonsils not swollen or erythematous. Hearing normal.  Neck: Supple, thyroid normal.  Respiratory: Respiratory effort normal, BS equal bilaterally + diffuse mild wheezing without rales, rhonchi, or stridor.  Cardio: RRR with no MRGs. Brisk peripheral pulses without edema.  Abdomen: Soft, + BS.  Tender LLQ , no guarding, rebound, hernias, masses. Lymphatics: Non tender without lymphadenopathy.  Musculoskeletal: Full ROM, 5/5 strength, gait antalgic Skin: Warm, dry without rashes, lesions, ecchymosis.  Neuro: Cranial nerves intact. 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  Psych: Awake and oriented X 3, normal affect, Insight and Judgment appropriate   Vicie Mutters, PA-C 2:48 PM [facility]

## 2014-09-07 NOTE — Patient Instructions (Signed)
Bad carbs also include fruit juice, alcohol, and sweet tea. These are empty calories that do not signal to your brain that you are full.   Please remember the good carbs are still carbs which convert into sugar. So please measure them out no more than 1/2-1 cup of rice, oatmeal, pasta, and beans  Veggies are however free foods! Pile them on.   Not all fruit is created equal. Please see the list below, the fruit at the bottom is higher in sugars than the fruit at the top. Please avoid all dried fruits.     Diverticulitis Diverticulitis is inflammation or infection of small pouches in your colon that form when you have a condition called diverticulosis. The pouches in your colon are called diverticula. Your colon, or large intestine, is where water is absorbed and stool is formed. Complications of diverticulitis can include:  Bleeding.  Severe infection.  Severe pain.  Perforation of your colon.  Obstruction of your colon. CAUSES  Diverticulitis is caused by bacteria. Diverticulitis happens when stool becomes trapped in diverticula. This allows bacteria to grow in the diverticula, which can lead to inflammation and infection. RISK FACTORS People with diverticulosis are at risk for diverticulitis. Eating a diet that does not include enough fiber from fruits and vegetables may make diverticulitis more likely to develop. SYMPTOMS  Symptoms of diverticulitis may include:  Abdominal pain and tenderness. The pain is normally located on the left side of the abdomen, but may occur in other areas.  Fever and chills.  Bloating.  Cramping.  Nausea.  Vomiting.  Constipation.  Diarrhea.  Blood in your stool. DIAGNOSIS  Your health care provider will ask you about your medical history and do a physical exam. You may need to have tests done because many medical conditions can cause the same symptoms as diverticulitis. Tests may include:  Blood tests.  Urine tests.  Imaging  tests of the abdomen, including X-rays and CT scans. When your condition is under control, your health care provider may recommend that you have a colonoscopy. A colonoscopy can show how severe your diverticula are and whether something else is causing your symptoms. TREATMENT  Most cases of diverticulitis are mild and can be treated at home. Treatment may include:  Taking over-the-counter pain medicines.  Following a clear liquid diet.  Taking antibiotic medicines by mouth for 7-10 days. More severe cases may be treated at a hospital. Treatment may include:  Not eating or drinking.  Taking prescription pain medicine.  Receiving antibiotic medicines through an IV tube.  Receiving fluids and nutrition through an IV tube.  Surgery. HOME CARE INSTRUCTIONS   Follow your health care provider's instructions carefully.  Follow a full liquid diet or other diet as directed by your health care provider. After your symptoms improve, your health care provider may tell you to change your diet. He or she may recommend you eat a high-fiber diet. Fruits and vegetables are good sources of fiber. Fiber makes it easier to pass stool.  Take fiber supplements or probiotics as directed by your health care provider.  Only take medicines as directed by your health care provider.  Keep all your follow-up appointments. SEEK MEDICAL CARE IF:   Your pain does not improve.  You have a hard time eating food.  Your bowel movements do not return to normal. SEEK IMMEDIATE MEDICAL CARE IF:   Your pain becomes worse.  Your symptoms do not get better.  Your symptoms suddenly get worse.  You have a fever.  You have repeated vomiting.  You have bloody or black, tarry stools. MAKE SURE YOU:   Understand these instructions.  Will watch your condition.  Will get help right away if you are not doing well or get worse. Document Released: 04/17/2005 Document Revised: 07/13/2013 Document Reviewed:  06/02/2013 [facility] Patient Information 2015 [place], [place]. This information is not intended to replace advice given to you by your health care provider. Make sure you discuss any questions you have with your health care provider.

## 2014-09-08 LAB — HEPATIC FUNCTION PANEL
ALBUMIN: 4.2 g/dL (ref 3.5–5.2)
ALT: 26 U/L (ref 0–35)
AST: 23 U/L (ref 0–37)
Alkaline Phosphatase: 65 U/L (ref 39–117)
Bilirubin, Direct: 0.1 mg/dL (ref 0.0–0.3)
Indirect Bilirubin: 0.3 mg/dL (ref 0.2–1.2)
TOTAL PROTEIN: 7 g/dL (ref 6.0–8.3)
Total Bilirubin: 0.4 mg/dL (ref 0.2–1.2)

## 2014-09-08 LAB — LIPID PANEL
CHOL/HDL RATIO: 4.4 ratio
CHOLESTEROL: 181 mg/dL (ref 0–200)
HDL: 41 mg/dL (ref >39)
LDL CALC: 92 mg/dL (ref 0–99)
TRIGLYCERIDES: 241 mg/dL — AB (ref <150)
VLDL: 48 mg/dL — ABNORMAL HIGH (ref 0–40)

## 2014-09-08 LAB — TSH: TSH: 2.384 u[IU]/mL (ref 0.350–4.500)

## 2014-09-08 LAB — BASIC METABOLIC PANEL WITH GFR
BUN: 13 mg/dL (ref 6–23)
CALCIUM: 9.9 mg/dL (ref 8.4–10.5)
CO2: 22 mEq/L (ref 19–32)
Chloride: 105 mEq/L (ref 96–112)
Creat: 0.77 mg/dL (ref 0.50–1.10)
GFR, EST AFRICAN AMERICAN: 89 mL/min
GFR, Est Non African American: 77 mL/min
Glucose, Bld: 87 mg/dL (ref 70–99)
Potassium: 3.9 mEq/L (ref 3.5–5.3)
Sodium: 139 mEq/L (ref 135–145)

## 2014-09-08 LAB — HEMOGLOBIN A1C
HEMOGLOBIN A1C: 6.7 % — AB (ref <5.7)
MEAN PLASMA GLUCOSE: 146 mg/dL — AB (ref <117)

## 2014-09-08 LAB — MAGNESIUM: Magnesium: 1.9 mg/dL (ref 1.5–2.5)

## 2014-09-08 LAB — VITAMIN D 25 HYDROXY (VIT D DEFICIENCY, FRACTURES): VIT D 25 HYDROXY: 26 ng/mL — AB (ref 30–100)

## 2014-09-30 ENCOUNTER — Encounter: Payer: Self-pay | Admitting: Gastroenterology

## 2014-10-10 ENCOUNTER — Encounter: Payer: Self-pay | Admitting: Physician Assistant

## 2014-10-10 ENCOUNTER — Ambulatory Visit (INDEPENDENT_AMBULATORY_CARE_PROVIDER_SITE_OTHER): Payer: Medicare HMO | Admitting: Physician Assistant

## 2014-10-10 VITALS — BP 120/80 | HR 68 | Temp 97.9°F | Resp 16 | Ht 64.0 in | Wt 162.0 lb

## 2014-10-10 DIAGNOSIS — J441 Chronic obstructive pulmonary disease with (acute) exacerbation: Secondary | ICD-10-CM

## 2014-10-10 DIAGNOSIS — B0221 Postherpetic geniculate ganglionitis: Secondary | ICD-10-CM

## 2014-10-10 DIAGNOSIS — F17200 Nicotine dependence, unspecified, uncomplicated: Secondary | ICD-10-CM

## 2014-10-10 MED ORDER — PREDNISONE 20 MG PO TABS: ORAL_TABLET | ORAL | Status: AC

## 2014-10-10 MED ORDER — AZITHROMYCIN 250 MG PO TABS: ORAL_TABLET | ORAL | Status: AC

## 2014-10-10 MED ORDER — PREGABALIN 50 MG PO CAPS: 50.0000 mg | ORAL_CAPSULE | Freq: Every day | ORAL | Status: DC

## 2014-10-10 MED ORDER — NEOMYCIN-POLYMYXIN-HC 3.5-10000-1 OT SOLN: 4.0000 [drp] | Freq: Four times a day (QID) | OTIC | Status: DC

## 2014-10-10 MED ORDER — FAMCICLOVIR 500 MG PO TABS: 500.0000 mg | ORAL_TABLET | Freq: Three times a day (TID) | ORAL | Status: DC

## 2014-10-10 NOTE — Progress Notes (Signed)
Subjective:    Patient ID: [name], female    DOB: 1941/06/27, 74 y.o.   MRN: [id number]  HPI 74 y.o. WF with complicated history of CVA some right hemiparesis, continuing tobacco use, COPD, DM II with CKD, chol, HTN, presents with right ear pain x Saturday. She states that Saturday AM she woke up with post nasal drip, cough with yellow/gray mucus, and right ear pain. The ear pain got progressively worse and she started to have pain on her right scalp and face with some numbness on her right paritel/occipital. She has had chills but no fever. She has been doing allergy pill, nasal spray, and spiriva once a day. She is on proair BID without help. Os 98% RA in the office. She denies fever, facial paralysis, rashes.   Review of Systems  Constitutional: Positive for chills and fatigue. Negative for fever, diaphoresis, activity change, appetite change and unexpected weight change.  HENT: Positive for congestion (only on right side), ear pain (right ear), hearing loss, postnasal drip, rhinorrhea, sinus pressure and tinnitus (right). Negative for dental problem, drooling, ear discharge, facial swelling, mouth sores, nosebleeds, sneezing, sore throat, trouble swallowing and voice change.   Eyes: Negative.   Respiratory: Positive for cough, shortness of breath and wheezing. Negative for apnea, choking, chest tightness and stridor.   Cardiovascular: Negative.  Negative for chest pain and palpitations.  Gastrointestinal: Negative.   Genitourinary: Negative.   Neurological: Positive for dizziness (unchanged from previous CVA), weakness (has sequela from previous CVA with right hemiparesis, unchanged), numbness (right scalp) and headaches (right side). Negative for tremors, seizures, syncope, facial asymmetry, speech difficulty and light-headedness.  Psychiatric/Behavioral: Negative.        Objective:   Physical Exam  Constitutional: She appears well-developed and well-nourished. No distress.   HENT:  Head: Normocephalic.  Right ear without mastoid tenderness, + erythema, vesicles in right ear canal with right TM with bulging injected/erythematous TM with inferior bullous.   Eyes: Conjunctivae and EOM are normal. Pupils are equal, round, and reactive to light.  Neck: Normal range of motion. Neck supple.  Cardiovascular: Normal rate, regular rhythm and normal heart sounds.   Pulmonary/Chest: Effort normal. No respiratory distress. She has decreased breath sounds (diffuse). She has no wheezes. She has no rales. She exhibits no tenderness.  Musculoskeletal:  Decreased strength of right arm/leg, unchanged from previous exams.   Lymphadenopathy:    She has no cervical adenopathy.  Neurological:  Cranial nerves: Facial asymmetry is present with flattening of nasal folds and abnormal smile on the right. The strength of the facial muscles and the muscles to head turning and shoulder shrug are normal bilaterally. Speech is well enunciated, no aphasia or dysarthria is noted. Extraocular movements are full. Visual fields are full. The tongue is midline, and the patient has symmetric elevation of the soft palate.   Motor: The motor testing reveals 4/5 strength of all left extremities with 3/5 in right.  Sensory: Sensory testing is intact to pinprick, soft touch, vibration sensation, and position sense on all 4 extremities. No evidence of extinction is noted.  Coordination: Cerebellar testing reveals good finger-nose-finger and heel-to-shin bilaterally.  Gait and station: Gait is unsteady with slight limp. Decreased strength in right arm due to previous stroke, left arm uneffected with no pronator drift   Skin: She is not diaphoretic.  Right cheek with 3x4 mm nodule with telangiectasias         Assessment & Plan:  COPD exacerbation- + decreased BS, normal  O2 RA, Advised to stop smoking, continue meds, zpak, prednisone.  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  Right cheek nodule with telangiectasias- will need to be referred to skin surgery center

## 2014-10-10 NOTE — Patient Instructions (Addendum)
Ramsay Hunt Syndrome Type II Ramsay Hunt syndrome type II is also known as herpes zoster oticus. It is a common complication of shingles. Shingles is an infection caused by the varicella-zoster virus. This is the virus that causes chickenpox. Shingles occurs in people who have had chickenpox. Shingles represents a reactivation of the inactive (dormant) varicella-zoster virus. Ramsay Hunt syndrome type II is caused by the spread of the varicella-zoster virus to facial and auditory (in the ear) nerves. SYMPTOMS  Symptoms include:  Intense ear pain.  Rash around the ear, mouth, face, neck, and scalp.  Paralysis of facial nerves. Other symptoms may include:  Hearing loss.  Abnormal sensation of movement/balance (vertigo).  Abnormal sounds in the ear (tinnitus).  Taste loss in the tongue.  Dry mouth and eyes.  Nausea and vomiting.  Abnormal eye movements (nystagmus). TREATMENT  Some cases of Ramsay Hunt syndrome type II do not require treatment. Most cases may require prompt treatment using oral or intravenous antiviral therapy. Corticosteroids may also be prescribed. Vertigo may be treated with the drug diazepam. It is important to seek medical care as soon as symptoms occur. Document Released: 06/28/2002 Document Revised: 01/07/2012 Document Reviewed: 10/22/2013 [facility] Patient Information 2015 [place], [place]. This information is not intended to replace advice given to you by your health care provider. Make sure you discuss any questions you have with your health care provider.  Can take the lyrica samples for nerve pain. It can make you sleepy so we suggest trying it at night first and please plan to not drive or do anything strenuous. Also please do not take this medication with alcohol.  Start out 1 pill at night before bed, can increase to 2 pills at night before bed. Please call the office if you have any side effects.   Can take 3 pills a day however you would like  Some  examples: - 1 breakfast, lunch, bedtime. - 1 at breakfast, 2 at bed time  How should I use this medicine? Take this medicine by mouth with a glass of water. Follow the directions on the prescription label. You can take this medicine with or without food. Take your doses at regular intervals. Do not take your medicine more often than directed. Do not stop taking except on your doctor's advice.  What if I miss a dose? If you miss a dose, take it as soon as you can. If it is almost time for your next dose, take only that dose. Do not take double or extra doses.  What should I watch for while using this medicine? Tell your doctor or healthcare professional if your symptoms do not start to get better or if they get worse.   You may get drowsy or dizzy. Do not drive, use machinery, or do anything that needs mental alertness until you know how this medicine affects you. Do not stand or sit up quickly, especially if you are an older patient. This reduces the risk of dizzy or fainting spells. Alcohol may interfere with the effect of this medicine. Avoid alcoholic drinks. If you have a heart condition, like congestive heart failure, and notice that you are retaining water and have swelling in your hands or feet, contact your health care provider immediately.  What side effects may I notice from receiving this medicine? Side effects that you should report to your doctor or health care professional as soon as possible and are very rare: -allergic reactions like skin rash, itching or hives, swelling of the face,  lips, or tongue -breathing problems -changes in vision -jerking or unusual movements of any part of your body -suicidal thoughts or other mood changes -swelling of the ankles, feet, hands -unusual bruising or bleeding  Side effects that usually do not require medical attention (Report these to your doctor or health care professional if they continue or are  bothersome.): -dizziness -drowsiness -dry mouth -nausea -tremors

## 2014-10-11 ENCOUNTER — Other Ambulatory Visit: Payer: Medicare HMO

## 2014-10-13 ENCOUNTER — Other Ambulatory Visit: Payer: Medicare HMO

## 2014-10-19 ENCOUNTER — Ambulatory Visit (INDEPENDENT_AMBULATORY_CARE_PROVIDER_SITE_OTHER): Payer: Medicare HMO | Admitting: Physician Assistant

## 2014-10-19 ENCOUNTER — Encounter: Payer: Self-pay | Admitting: Physician Assistant

## 2014-10-19 VITALS — BP 148/78 | HR 76 | Temp 97.7°F | Resp 16 | Ht 63.5 in | Wt 163.0 lb

## 2014-10-19 DIAGNOSIS — F17200 Nicotine dependence, unspecified, uncomplicated: Secondary | ICD-10-CM

## 2014-10-19 DIAGNOSIS — B0221 Postherpetic geniculate ganglionitis: Secondary | ICD-10-CM

## 2014-10-19 DIAGNOSIS — Z720 Tobacco use: Secondary | ICD-10-CM

## 2014-10-19 DIAGNOSIS — C44319 Basal cell carcinoma of skin of other parts of face: Secondary | ICD-10-CM

## 2014-10-19 DIAGNOSIS — J449 Chronic obstructive pulmonary disease, unspecified: Secondary | ICD-10-CM

## 2014-10-19 NOTE — Patient Instructions (Signed)
Basal Cell Carcinoma Basal cell carcinoma is the most common form of skin cancer. It begins in the basal cells, which are at the bottom of the outer skin layer (epidermis). CAUSES  Sun exposure is the most common cause of basal cell carcinoma. Basal cell carcinoma occurs most often on parts of the body that are frequently exposed to the sun, including the:  Scalp.  Ears.  Neck.  Face.  Arms.  Backs of the hands.  Legs. However, basal cell carcinoma can occur anywhere on the body. Rarely, tumors develop on areas not exposed to the sun. Other causes of basal cell carcinoma can include:  Exposure to arsenic.  Exposure to radiation.  Certain genetic syndromes, such as xeroderma pigmentosum. RISK FACTORS People at highest risk for basal cell carcinoma include those with:  Fair skin.  Blonde or red hair.  Blue, green, or gray eyes.  Childhood freckling. Factors that increase your risk for basal cell carcinoma include:  Sun exposure over long periods of time. Childhood sun exposure appears to be a more significant factor than sun exposure as an adult.  Repeated sunburns.  Use of tanning beds.  Having a weakened immune system. SYMPTOMS Five signs of basal cell carcinoma are:  An open sore that bleeds, oozes, or crusts. The sore may remain open for 3 or more weeks. This can be an early sign of basal cell carcinoma. Basal cell carcinoma can mimic a pimple that will not heal.  A reddish or irritated area which may crust, itch, or cause discomfort. This may occur on areas expose d to the sun. These patches might be easier felt than seen.  A shiny, pearly, or translucent bump that is pink, red, or white. The bump may also be tan, black, or brown, especially in dark haired people. These bumps can be confused with moles.  A pink growth with a slightly elevated, rolled border, and a crusted indentation in the center. As the growth slowly enlarges, tiny blood vessels may develop  on the surface.  A scar-like white, yellow, or waxy area that looks like shiny, stretched skin. It often has irregular borders. This may be a sign of more aggressive basal cell carcinoma. DIAGNOSIS  Your caregiver may be able to tell what is wrong by doing a physical exam. Often, a tissue sample (biopsy) is also taken. The tissue is examined under a microscope.  TREATMENT  The treatment for basal cell carcinoma depends on the type, size, location, and number of tumors. Possible treatments include:   Mohs surgery. This is a procedure done by a skin doctor (dermatologist or Mohs surgeon) in his or her office. The cancerous cells are removed layer by layer. This treatment has a high cure rate.  Surgical removal of the tumor.  Freezing the tumor with liquid nitrogen (cryosurgery).  Plastic surgery to remove the tumor, in the case of large tumors.  Radiation. This may be used for tumors on the face.  Photodynamic therapy. A chemical cream is applied to the skin and light exposure is used to activate the chemical.  Chemical treatments, such as imiquimod cream and interferon injections. This may be used to remove superficial tumors with minimal scarring.  Electrodesiccation and curettage. This involves alternately scraping and burning the tumor, using an electric current to control bleeding. Basal cell carcinoma can almost always be cured. It rarely spreads to other areas of the body (metastasizes). Basal cell carcinoma may come back at the same location (recur), but it can be treated  again if this occurs. PREVENTION  Avoid the sun between 10:00 a.m. and 4:00 pm when it is the strongest.  Use a sunscreen or sunblock with a sun protection factor of 30 or greater.  Apply sunscreen at least 30 minutes before exposure to the sun.  Reapply sunscreen every 2 to 4 hours while you are outside, after swimming, and after excessive sweating.  Always wear protective hats, clothing, and sunglasses with  ultraviolet protection.  Avoid tanning beds. HOME CARE INSTRUCTIONS   Avoid unprotected sun exposure.  Follow your caregiver's instructions for self-exams. Look for new spots or changes in your skin.  Keep all follow-up appointments as directed by your caregiver. SEEK MEDICAL CARE IF:   You notice any new spots or changes in your skin.  You have had a basal cell carcinoma tumor removed and you notice a new growth in the same location. Document Released: 01/12/2003 Document Revised: 01/07/2012 Document Reviewed: 04/01/2011 [facility] Patient Information 2015 [place], [place]. This information is not intended to replace advice given to you by your health care provider. Make sure you discuss any questions you have with your health care provider.

## 2014-10-19 NOTE — Progress Notes (Signed)
Assessment and Plan: 1. TOBACCO USER Smoking cessation-  instruction/counseling given, counseled patient on the dangers of tobacco use, advised patient to stop smoking, and reviewed strategies to maximize success, patient not ready to quit at this time.   2. COPD mixed type Advised to stop smoking, will get CXR as needed, continue meds.   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 Basal cell carcinoma of cheek - Ambulatory referral to Dermatology  Future Appointments Date Time Provider [place]  10/24/2014 2:10 PM [facility] DIAG [place] 2 [facility]  10/24/2014 2:20 PM [facility] [place] 1 [facility]  12/28/2014 2:30 PM [name], MD [facility] None  02/23/2015 2:00 PM Deneise Lever, MD [facility] None  06/19/2015 2:00 PM [name], MD [facility] None    HPI 74 y.o.female presents for follow up for COPD exacerbation and ramsay hunt syndrome. She is unable to tolerate acyclovir but was put on famvir and tolerate that, she finished this, did ear drops, and then she is on prednisone but it on half the dose and states it is making her "crazy". She has finished the zpak and states it is feeling better, no more coughing, SOB, wheezing. She continues to smoke.   Past Medical History  Diagnosis Date  . CVA (cerebral vascular accident) 59  . Tobacco abuse   . Allergic rhinitis   . Hypertension   . Hyperlipidemia   . Elevated hemoglobin A1c measurement   . COPD (chronic obstructive pulmonary disease)   . Lymphocytosis     right arm  . ASCVD (arteriosclerotic cardiovascular disease)   . CVA (cerebral vascular accident)   . Atherosclerosis of aorta 2014    via CT AB/Pelvis- no AAA  . Ovarian cyst   . SVD (spontaneous vaginal delivery)     x 2  . Shortness of breath     occasional  . Diabetes mellitus without complication   . DJD (degenerative joint disease)    neck, lower back, feet, hands  . Breast cancer 1990    right  . 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 Ace Inhibitors Cough  . Amoxil [Amoxicillin] Diarrhea  . Bisoprolol     Decreased Heart Rate  . Ciprofloxacin Diarrhea  . Crestor [Rosuvastatin]     Elevated LFT's and Myalgias  . Doxycycline Diarrhea  . Enalapril Cough  . Lipitor [Atorvastatin]     Elevated LFT's and Myalgias  . Metformin And Related Diarrhea  . Prednisone     Intolerant to High Dose Prednisone  . Vicodin [Hydrocodone-Acetaminophen]     Increased Anxiety and Nervousness  . Acyclovir And Related Rash    Okay to take famvir      Current Outpatient Prescriptions on File Prior to Visit  Medication Sig Dispense Refill  . acetaminophen-codeine (TYLENOL #3) 300-30 MG per tablet Take 1-2 tablets by mouth every 6 (six) hours as needed. 60 tablet 0  . albuterol (PROVENTIL HFA;VENTOLIN HFA) 108 (90 BASE) MCG/ACT inhaler Inhale 2 puffs into the lungs every 6 (six) hours as needed for wheezing or shortness of breath.    Marland Kitchen aspirin 81 MG tablet Take 81 mg by mouth daily.      . Cholecalciferol (VITAMIN D3) 2000 UNITS TABS Take 3 tablets by mouth daily. 6000u per day    . famciclovir (FAMVIR) 500 MG tablet Take  1 tablet (500 mg total) by mouth 3 (three) times daily. 7 days 21 tablet 0  . fenofibrate micronized (LOFIBRA) 134 MG capsule TAKE ONE CAPSULE BY MOUTH ONCE DAILY 90 capsule 0  . fish oil-omega-3 fatty acids 1000 MG capsule Take 1 capsule by mouth 3 (three) times daily.     . Flaxseed, Linseed, (FLAX SEED OIL) 1000 MG CAPS Take 1 capsule by mouth 3 (three) times daily.      . fluticasone (FLONASE) 50 MCG/ACT nasal spray Place 2 sprays into both nostrils daily as needed for allergies or rhinitis.    . furosemide (LASIX) 20 MG tablet Take 1 tablet (20 mg total) by mouth daily. 30 tablet 3  . losartan (COZAAR) 100 MG tablet TAKE ONE TABLET BY MOUTH ONCE  DAILY 30 tablet 3  . Magnesium 500 MG TABS Take 1 tablet by mouth 2 (two) times daily.     . metFORMIN (GLUCOPHAGE-XR) 500 MG 24 hr tablet TAKE FOUR TABLETS BY MOUTH ONCE DAILY FOR  BLOOD  SUGAR 360 tablet 0  . neomycin-polymyxin-hydrocortisone (CORTISPORIN) otic solution Place 4 drops into the right ear 4 (four) times daily. 7 days- Please dispense quantity sufficient. 10 mL 0  . predniSONE (DELTASONE) 20 MG tablet 1 pill 3 x a day for 3 days, 1 pill 2 x a day x 3 days, 1 pill a day x 5 days with food 20 tablet 0  . tiotropium (SPIRIVA) 18 MCG inhalation capsule Place 18 mcg into inhaler and inhale daily as needed.     No current facility-administered medications on file prior to visit.    ROS: all negative except above.   Physical Exam: Filed Weights   10/19/14 1513  Weight: 163 lb (73.936 kg)   BP 148/78 mmHg  Pulse 76  Temp(Src) 97.7 F (36.5 C)  Resp 16  Ht 5' 3.5" (1.613 m)  Wt 163 lb (73.936 kg)  BMI 28.42 kg/m2 Physical Exam  Constitutional: She appears well-developed and well-nourished. No distress.  HENT:  Head: Normocephalic.  Right ear without mastoid tenderness, + erythema in right ear canal, TM appears to have effusion behind it but no more vesicles in canal and no bulging/bollous of TM.  Eyes: Conjunctivae and EOM are normal. Pupils are equal, round, and reactive to light.  Neck: Normal range of motion. Neck supple.  Cardiovascular: Normal rate, regular rhythm and normal heart sounds.  Pulmonary/Chest: Effort normal. No respiratory distress. She has decreased breath sounds, but improved from previous visit. She has no wheezes. She has no rales. She exhibits no tenderness.  Musculoskeletal:  Decreased strength of right arm/leg, unchanged from previous exams.  Lymphadenopathy:   She has no cervical adenopathy.  Neurological:  Cranial nerves: Facial asymmetry is present with flattening of nasal folds and abnormal smile on the right but has improved from last OV.  The strength of the facial muscles and the muscles to head turning and shoulder shrug are normal bilaterally. Speech is well enunciated, no aphasia or dysarthria is noted. Extraocular movements are full. Visual fields are full. The tongue is midline, and the patient has symmetric elevation of the soft palate.  Motor: The motor testing reveals 4/5 strength of all left extremities with 3/5 in right.  Gait and station: Gait is unsteady with slight limp. Decreased strength in right arm due to previous stroke, left arm uneffected with no pronator drift  Skin: She is not diaphoretic.  Right cheek with 3x4 mm nodule with telangiectasias    Vicie Mutters, PA-C  3:39 PM [facility]

## 2014-10-20 ENCOUNTER — Encounter: Payer: Self-pay | Admitting: Internal Medicine

## 2014-10-24 ENCOUNTER — Ambulatory Visit
Admission: RE | Admit: 2014-10-24 | Discharge: 2014-10-24 | Disposition: A | Discharge status: Home / Self Care | Payer: Medicare HMO | Source: Ambulatory Visit | Attending: Internal Medicine | Admitting: Internal Medicine

## 2014-10-24 ENCOUNTER — Other Ambulatory Visit: Payer: Self-pay | Admitting: Internal Medicine

## 2014-10-24 DIAGNOSIS — Z9011 Acquired absence of right breast and nipple: Secondary | ICD-10-CM

## 2014-10-24 DIAGNOSIS — Z853 Personal history of malignant neoplasm of breast: Secondary | ICD-10-CM

## 2014-10-24 DIAGNOSIS — N6320 Unspecified lump in the left breast, unspecified quadrant: Secondary | ICD-10-CM

## 2014-10-26 ENCOUNTER — Telehealth: Payer: Self-pay | Admitting: *Deleted

## 2014-10-26 ENCOUNTER — Encounter: Payer: Self-pay | Admitting: *Deleted

## 2014-10-26 DIAGNOSIS — C50312 Malignant neoplasm of lower-inner quadrant of left female breast: Secondary | ICD-10-CM

## 2014-10-26 DIAGNOSIS — Z170 Estrogen receptor positive status [ER+]: Secondary | ICD-10-CM

## 2014-10-26 NOTE — Telephone Encounter (Signed)
Confirmed [facility] for 11/02/14 at 1230 .  Instructions and contact information given.

## 2014-10-30 ENCOUNTER — Other Ambulatory Visit: Payer: Self-pay | Admitting: Internal Medicine

## 2014-11-02 ENCOUNTER — Other Ambulatory Visit: Payer: Self-pay | Admitting: General Surgery

## 2014-11-02 ENCOUNTER — Ambulatory Visit (HOSPITAL_BASED_OUTPATIENT_CLINIC_OR_DEPARTMENT_OTHER): Payer: Medicare HMO | Admitting: Oncology

## 2014-11-02 ENCOUNTER — Encounter: Payer: Self-pay | Admitting: Oncology

## 2014-11-02 ENCOUNTER — Ambulatory Visit
Admission: RE | Admit: 2014-11-02 | Discharge: 2014-11-02 | Disposition: A | Discharge status: Home / Self Care | Payer: Medicare HMO | Source: Ambulatory Visit | Attending: Radiation Oncology | Admitting: Radiation Oncology

## 2014-11-02 ENCOUNTER — Ambulatory Visit (HOSPITAL_BASED_OUTPATIENT_CLINIC_OR_DEPARTMENT_OTHER): Payer: Medicare HMO

## 2014-11-02 ENCOUNTER — Other Ambulatory Visit (HOSPITAL_BASED_OUTPATIENT_CLINIC_OR_DEPARTMENT_OTHER): Payer: Medicare HMO

## 2014-11-02 ENCOUNTER — Ambulatory Visit: Payer: Medicare HMO | Admitting: Physical Therapy

## 2014-11-02 VITALS — BP 83/46 | HR 73 | Temp 97.9°F | Resp 18 | Ht 63.5 in | Wt 163.2 lb

## 2014-11-02 DIAGNOSIS — C50312 Malignant neoplasm of lower-inner quadrant of left female breast: Secondary | ICD-10-CM

## 2014-11-02 DIAGNOSIS — C50912 Malignant neoplasm of unspecified site of left female breast: Secondary | ICD-10-CM | POA: Diagnosis not present

## 2014-11-02 DIAGNOSIS — Z170 Estrogen receptor positive status [ER+]: Secondary | ICD-10-CM | POA: Diagnosis not present

## 2014-11-02 DIAGNOSIS — I2510 Atherosclerotic heart disease of native coronary artery without angina pectoris: Secondary | ICD-10-CM

## 2014-11-02 DIAGNOSIS — I6789 Other cerebrovascular disease: Secondary | ICD-10-CM

## 2014-11-02 DIAGNOSIS — Z853 Personal history of malignant neoplasm of breast: Secondary | ICD-10-CM | POA: Diagnosis not present

## 2014-11-02 DIAGNOSIS — K7469 Other cirrhosis of liver: Secondary | ICD-10-CM

## 2014-11-02 LAB — COMPREHENSIVE METABOLIC PANEL (CC13)
ALK PHOS: 97 U/L (ref 40–150)
ALT: 33 U/L (ref 0–55)
AST: 23 U/L (ref 5–34)
Albumin: 4 g/dL (ref 3.5–5.0)
Anion Gap: 9 mEq/L (ref 3–11)
BUN: 15.6 mg/dL (ref 7.0–26.0)
CALCIUM: 10.3 mg/dL (ref 8.4–10.4)
CO2: 23 mEq/L (ref 22–29)
CREATININE: 1 mg/dL (ref 0.6–1.1)
Chloride: 106 mEq/L (ref 98–109)
EGFR: 58 mL/min/{1.73_m2} — ABNORMAL LOW (ref >90)
GLUCOSE: 214 mg/dL — AB (ref 70–140)
Potassium: 4.1 mEq/L (ref 3.5–5.1)
SODIUM: 138 meq/L (ref 136–145)
Total Bilirubin: 0.38 mg/dL (ref 0.20–1.20)
Total Protein: 7.4 g/dL (ref 6.4–8.3)

## 2014-11-02 LAB — CBC WITH DIFFERENTIAL/PLATELET
BASO%: 1.1 % (ref 0.0–2.0)
BASOS ABS: 0.1 10*3/uL (ref 0.0–0.1)
EOS%: 1.7 % (ref 0.0–7.0)
Eosinophils Absolute: 0.2 10*3/uL (ref 0.0–0.5)
HEMATOCRIT: 40.9 % (ref 34.8–46.6)
HGB: 13.8 g/dL (ref 11.6–15.9)
LYMPH%: 50.1 % — AB (ref 14.0–49.7)
MCH: 32.5 pg (ref 25.1–34.0)
MCHC: 33.8 g/dL (ref 31.5–36.0)
MCV: 96.2 fL (ref 79.5–101.0)
MONO#: 0.8 10*3/uL (ref 0.1–0.9)
MONO%: 8.8 % (ref 0.0–14.0)
NEUT#: 3.7 10*3/uL (ref 1.5–6.5)
NEUT%: 38.3 % — ABNORMAL LOW (ref 38.4–76.8)
PLATELETS: 243 10*3/uL (ref 145–400)
RBC: 4.25 10*6/uL (ref 3.70–5.45)
RDW: 12.9 % (ref 11.2–14.5)
WBC: 9.6 10*3/uL (ref 3.9–10.3)
lymph#: 4.8 10*3/uL — ABNORMAL HIGH (ref 0.9–3.3)

## 2014-11-02 NOTE — Progress Notes (Signed)
Checked in new pt with no financial concerns prior to seeing the dr.  She has my card for any billing questions or concerns or if she's in need of financial assistance once treatment starts.

## 2014-11-02 NOTE — Progress Notes (Signed)
[place]  [phone] Fax:([phone]) [phone]     ID: [name] DOB: 11/28/1940  MR#: [id number]  DUK#:[id number]  Patient Care Team: Kim Pinto, MD as PCP - General (Internal Medicine) Kim Castle, MD as Consulting Physician (Gastroenterology) Kim Lever, MD as Consulting Physician (Pulmonary Disease) [name], OD (Optometry) Kim Klein, MD as Consulting Physician (General Surgery) Kim Cruel, MD as Consulting Physician (Oncology) Kim Gibson, MD as Attending Physician (Radiation Oncology) Kim Kaufmann, RN as Registered Nurse Kim Germany, RN as Registered Nurse PCP: Kim Richards, MD GYN: Kim Farrier MD OTHER MD: Kim Barre MD  CHIEF COMPLAINT: Estrogen receptor positive breast cancer  CURRENT TREATMENT: Awaiting definitive surgery   BREAST CANCER HISTORY: "[name]" has a history of right breast cancer, status post right modified radical mastectomy in 1990. The patient tells me the tumor was "pretty day", and that they took 13 lymph nodes out. She does not know if the cancer was in the lymph nodes. She received no adjuvant therapy. She also has a history of uterine cancer in her 15s.  More recently, she had routine screening mammography September 2015 which showed a possible mass in the left breast. Diagnostic left mammography and left ultrasonography 04/11/2014 at the left breast showed an area of focal asymmetry measuring 8 mm in the left breast which was not palpable and not seen by ultrasonography.  Six-month follow-up was recommended and this was performed 10/24/2014, this time with tomography. The breast density was category C. There was an irregular subcentimeter mass in the left breast 8:00 position with coarse calcifications. This was not palpable, but this time ultrasound showed a hypoechoic left breast mass at the area in question, measuring 0.5 cm.  Biopsy of the left breast mass 10/24/2014 showed  ([id number]) an invasive ductal carcinoma, grade 1 or 2, estrogen receptor 100% positive, progesterone receptor 75% positive, both with strong staining intensity, with an MIB-1 of 11% and no HER-2 amplification, the signals ratio being 1.09 and the number per cell 1.80.  The patient's subsequent history is as detailed below  INTERVAL HISTORY: [name] was evaluated in the multidisciplinary breast cancer clinic 11/02/2014 accompanied by her husband [name]. Her case was presented at the multidisciplinary breast cancer conference the same morning. At that time a preliminary plan was made for breast conserving surgery and genetics referral. If the patient was able to tolerate anti-estrogens it was felt she might be able to avoid adjuvant radiation.  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 She is "almost blind". She has significant sinus problems. She is short of breath with walking even on a flat surface. She sleeps on 2 pillows. She complains of back pain and joint pain and has great difficulty walking. Her blood sugars are not well controlled. A detailed review of systems today was otherwise noncontributory  PAST MEDICAL HISTORY: Past Medical History  Diagnosis Date  . CVA (cerebral vascular accident) 18  . Tobacco abuse   . Allergic rhinitis   . Hypertension   . Hyperlipidemia   . Elevated hemoglobin A1c measurement   . COPD (chronic obstructive pulmonary disease)   . Lymphocytosis     right arm  . ASCVD (arteriosclerotic cardiovascular disease)   . CVA (cerebral vascular accident)   . Atherosclerosis of aorta 2014    via CT AB/Pelvis- no AAA  . Ovarian cyst   . SVD (spontaneous vaginal delivery)  x 2  . Shortness of breath     occasional  . Diabetes mellitus without complication   . DJD (degenerative joint disease)     neck, lower back, feet, hands  . Breast cancer 1990    right  . 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 Cancer of lower-inner quadrant of female breast 10/26/2014    PAST SURGICAL HISTORY: Past Surgical History  Procedure Laterality Date  . Mastectomy  1990    Right, no adjuvant  . Cholecystectomy  2006  . Abdominal hysterectomy  1976  . Cholecystectomy N/A 2008    [name]  . Excision of benign salivary tumor Left 1968  . Shoulder surgery Left 2007    [name]  . Back surgery  2005    lower back  . Multiple tooth extractions    . Breast surgery  1990    right  . Bladder tact  1985  . Eye surgery      bilateral cataract eye surgery - now low vision  . Laparoscopic bilateral salpingo oopherectomy Bilateral 11/30/2013    Procedure: LAPAROSCOPIC RIGHT SALPINGO OOPHORECTOMY with extensive lysis of adhesions;  Surgeon: Kim Katz, MD;  Location: [facility];  Service: Gynecology;  Laterality: Bilateral;    FAMILY HISTORY Family History  Problem Relation Age of Onset  . Emphysema Brother   . Emphysema Brother   . Emphysema Sister   . Allergies Brother     all brothers and sisters  . Asthma Sister   . Asthma Sister   . Heart disease Mother   . Cancer Mother     breast  . Heart disease Sister   . Heart disease Brother   . Cancer Sister     breast  . Colon cancer Neg Hx    the patient's father died from bladder cancer the age of 48. The patient's mother died from heart disease at the age of 60. The patient had 5 brothers and 7 sisters. The patient's mother and 3 of her sisters had breast cancer. There is no history of ovarian cancer in the family  GYNECOLOGIC HISTORY:  No LMP recorded. Patient is postmenopausal. Menarche age 89, first live birth age 25. She is GX P2. She did not recall exactly when she went through menopause, but she underwent hysterectomy and unilateral salpingo-oophorectomy remotely. She never took hormone replacement. 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 She is very limited in what she can do at present. Her husband [name] is retired from Biomedical engineer. Their daughter [name] lives in [place] and is a health caregiver. Son Vergia Alcon lives in [place] and works in home improvement. The patient has 5 grandchildren and 6 great-grandchildren. She is a Psychologist, forensic    ADVANCED DIRECTIVES: Not in place   HEALTH MAINTENANCE: History  Substance Use Topics  . Smoking status: Current Every Day Smoker -- 2.00 packs/day for 55 years    Types: Cigarettes  . Smokeless tobacco: Current User     Comment: Electronic cig as well @ 0.82m of e cig only  . Alcohol Use: No     Colonoscopy: November 2015  PAP: Status post hysterectomy  Bone density: 06/26/2011, normal  Lipid panel:  Allergies  Allergen Reactions  . Ace Inhibitors Cough  . Amoxil [Amoxicillin] Diarrhea  . Bisoprolol     Decreased Heart Rate  .  Ciprofloxacin Diarrhea  . Crestor [Rosuvastatin]     Elevated LFT's and Myalgias  . Doxycycline Diarrhea  . Enalapril Cough  . Lipitor [Atorvastatin]     Elevated LFT's and Myalgias  . Metformin And Related Diarrhea  . Prednisone     Intolerant to High Dose Prednisone  . Vicodin [Hydrocodone-Acetaminophen]     Increased Anxiety and Nervousness  . Acyclovir And Related Rash    Okay to take famvir    Current Outpatient Prescriptions  Medication Sig Dispense Refill  . acetaminophen-codeine (TYLENOL #3) 300-30 MG per tablet Take 1-2 tablets by mouth every 6 (six) hours as needed. 60 tablet 0  . albuterol (PROVENTIL HFA;VENTOLIN HFA) 108 (90 BASE) MCG/ACT inhaler Inhale 2 puffs into the lungs every 6 (six) hours as needed for wheezing or shortness of breath.    Marland Kitchen aspirin 81 MG tablet Take 81 mg by mouth daily.      . Cholecalciferol (VITAMIN D3) 2000 UNITS TABS Take 3 tablets by mouth daily. 6000u per day    . fenofibrate micronized (LOFIBRA) 134 MG capsule TAKE ONE CAPSULE BY MOUTH ONCE  DAILY 90 capsule 0  . fenofibrate micronized (LOFIBRA) 134 MG capsule TAKE ONE CAPSULE BY MOUTH ONCE DAILY 90 capsule 1  . fish oil-omega-3 fatty acids 1000 MG capsule Take 1 capsule by mouth 3 (three) times daily.     . Flaxseed, Linseed, (FLAX SEED OIL) 1000 MG CAPS Take 1 capsule by mouth 3 (three) times daily.      . fluticasone (FLONASE) 50 MCG/ACT nasal spray Place 2 sprays into both nostrils daily as needed for allergies or rhinitis.    . furosemide (LASIX) 20 MG tablet Take 1 tablet (20 mg total) by mouth daily. 30 tablet 3  . losartan (COZAAR) 100 MG tablet TAKE ONE TABLET BY MOUTH ONCE DAILY 90 tablet 1  . Magnesium 500 MG TABS Take 1 tablet by mouth 2 (two) times daily.     . metFORMIN (GLUCOPHAGE-XR) 500 MG 24 hr tablet TAKE FOUR TABLETS BY MOUTH ONCE DAILY FOR BLOOD SUGAR. 360 tablet 1  . tiotropium (SPIRIVA) 18 MCG inhalation capsule Place 18 mcg into inhaler and inhale daily as needed.     No current facility-administered medications for this visit.    OBJECTIVE: Older white woman who appears chronically ill Filed Vitals:   11/02/14 1258  BP: 83/46  Pulse: 73  Temp: 97.9 F (36.6 C)  Resp: 18     Body mass index is 28.45 kg/(m^2).    ECOG FS:2 - Symptomatic, <50% confined to bed  Ocular: Sclerae unicteric, EOMs intact Ear-nose-throat: Oropharynx clear, slightly dry Lymphatic: No cervical or supraclavicular adenopathy Lungs no rales or rhonchi,  Heart regular rate and rhythm Abd soft, nontender, positive bowel sounds MSK no focal spinal tenderness, no upper extremity lymphedema Neuro: Paresis right side of body, well-oriented, appropriate affect Breasts: The right breast is status post mastectomy. There is no evidence of local recurrence. The right axilla is benign.. The left breast is status post recent biopsy. There is a small ecchymosis at the site, but no palpable mass. The left axilla is benign   LAB RESULTS:  CMP     Component Value Date/Time   NA 138  11/02/2014 1204   NA 139 09/07/2014 1501   K 4.1 11/02/2014 1204   K 3.9 09/07/2014 1501   CL 105 09/07/2014 1501   CO2 23 11/02/2014 1204   CO2 22 09/07/2014 1501   GLUCOSE 214* 11/02/2014 1204  GLUCOSE 87 09/07/2014 1501   BUN 15.6 11/02/2014 1204   BUN 13 09/07/2014 1501   CREATININE 1.0 11/02/2014 1204   CREATININE 0.77 09/07/2014 1501   CREATININE 0.75 04/28/2008 1607   CALCIUM 10.3 11/02/2014 1204   CALCIUM 9.9 09/07/2014 1501   PROT 7.4 11/02/2014 1204   PROT 7.0 09/07/2014 1501   ALBUMIN 4.0 11/02/2014 1204   ALBUMIN 4.2 09/07/2014 1501   AST 23 11/02/2014 1204   AST 23 09/07/2014 1501   ALT 33 11/02/2014 1204   ALT 26 09/07/2014 1501   ALKPHOS 97 11/02/2014 1204   ALKPHOS 65 09/07/2014 1501   BILITOT 0.38 11/02/2014 1204   BILITOT 0.4 09/07/2014 1501   GFRNONAA 77 09/07/2014 1501   GFRNONAA >60 04/28/2008 1607   GFRAA 89 09/07/2014 1501   GFRAA  04/28/2008 1607    >60        The eGFR has been calculated using the MDRD equation. This calculation has not been validated in all clinical    INo results found for: SPEP, UPEP  Lab Results  Component Value Date   WBC 9.6 11/02/2014   NEUTROABS 3.7 11/02/2014   HGB 13.8 11/02/2014   HCT 40.9 11/02/2014   MCV 96.2 11/02/2014   PLT 243 11/02/2014      Chemistry      Component Value Date/Time   NA 138 11/02/2014 1204   NA 139 09/07/2014 1501   K 4.1 11/02/2014 1204   K 3.9 09/07/2014 1501   CL 105 09/07/2014 1501   CO2 23 11/02/2014 1204   CO2 22 09/07/2014 1501   BUN 15.6 11/02/2014 1204   BUN 13 09/07/2014 1501   CREATININE 1.0 11/02/2014 1204   CREATININE 0.77 09/07/2014 1501   CREATININE 0.75 04/28/2008 1607      Component Value Date/Time   CALCIUM 10.3 11/02/2014 1204   CALCIUM 9.9 09/07/2014 1501   ALKPHOS 97 11/02/2014 1204   ALKPHOS 65 09/07/2014 1501   AST 23 11/02/2014 1204   AST 23 09/07/2014 1501   ALT 33 11/02/2014 1204   ALT 26 09/07/2014 1501   BILITOT 0.38 11/02/2014 1204    BILITOT 0.4 09/07/2014 1501       No results found for: LABCA2  No components found for: LABCA125  No results for input(s): INR in the last 168 hours.  Urinalysis    Component Value Date/Time   COLORURINE YELLOW 07/05/2013 [place] 07/05/2013 1658   LABSPEC 1.015 07/05/2013 1658   PHURINE 7.5 07/05/2013 1658   GLUCOSEU 100* 07/05/2013 1658   HGBUR NEG 07/05/2013 1658   BILIRUBINUR NEG 07/05/2013 1658   KETONESUR NEG 07/05/2013 1658   PROTEINUR NEG 07/05/2013 1658   UROBILINOGEN 0.2 07/05/2013 1658   NITRITE NEG 07/05/2013 1658   LEUKOCYTESUR NEG 07/05/2013 1658    STUDIES: Mm Digital Diagnostic Unilat L  10/24/2014   CLINICAL DATA:  Clip placement after ultrasound-guided left breast biopsy  EXAM: DIAGNOSTIC left MAMMOGRAM POST ULTRASOUND BIOPSY  COMPARISON:  Previous exam(s).  FINDINGS: Mammographic images were obtained following ultrasound guided biopsy of left breast mass 8 o'clock location. The ribbon shaped clip is located 1 cm anterior to the biopsied mass in the left breast 8 o'clock location.  IMPRESSION: Ribbon shaped clip located 1 cm anterior to the biopsied mass in the left breast 8 o'clock location.  Final Assessment: Post Procedure Mammograms for Marker Placement   Electronically Signed   By: Conchita Paris M.D.   On: 10/24/2014 15:51  US Breast Ltd Uni Left Inc Axilla  10/24/2014   CLINICAL DATA:  Followup left breast mass  EXAM: DIGITAL DIAGNOSTIC left MAMMOGRAM WITH 3D TOMOSYNTHESIS WITH CAD  ULTRASOUND left BREAST  COMPARISON:  Prior exams  ACR Breast Density Category c: The breast tissue is heterogeneously dense, which may obscure small masses.  FINDINGS: Scattered benign-appearing calcifications are reidentified in the left breast. An irregular sub cm mass is identified in the left breast approximate 8 o'clock location, with a coarse peripheral calcification.  Mammographic images were processed with CAD.  On physical exam, I palpate no abnormality  in the left lower inner quadrant.  Targeted ultrasound is performed, showing a hypoechoic mildly irregular left breast mass with peripheral coarse calcifications seen as corresponding to the mammographic appearance, left breast 8 o'clock location 6 cm from the nipple measuring 5 x 5 x 2 mm.  IMPRESSION: Suspicious left breast mass 8 o'clock location. Ultrasound-guided core biopsy will be performed today if the patient's request and dictated separately.  RECOMMENDATION: Left breast ultrasound-guided core biopsy  I have discussed the findings and recommendations with the patient. Results were also provided in writing at the conclusion of the visit. If applicable, a reminder letter will be sent to the patient regarding the next appointment.  BI-RADS CATEGORY  4: Suspicious.   Electronically Signed   By: Conchita Paris M.D.   On: 10/24/2014 15:21   Mm Diag Breast Tomo Uni Left  10/24/2014   CLINICAL DATA:  Followup left breast mass  EXAM: DIGITAL DIAGNOSTIC left MAMMOGRAM WITH 3D TOMOSYNTHESIS WITH CAD  ULTRASOUND left BREAST  COMPARISON:  Prior exams  ACR Breast Density Category c: The breast tissue is heterogeneously dense, which may obscure small masses.  FINDINGS: Scattered benign-appearing calcifications are reidentified in the left breast. An irregular sub cm mass is identified in the left breast approximate 8 o'clock location, with a coarse peripheral calcification.  Mammographic images were processed with CAD.  On physical exam, I palpate no abnormality in the left lower inner quadrant.  Targeted ultrasound is performed, showing a hypoechoic mildly irregular left breast mass with peripheral coarse calcifications seen as corresponding to the mammographic appearance, left breast 8 o'clock location 6 cm from the nipple measuring 5 x 5 x 2 mm.  IMPRESSION: Suspicious left breast mass 8 o'clock location. Ultrasound-guided core biopsy will be performed today if the patient's request and dictated separately.   RECOMMENDATION: Left breast ultrasound-guided core biopsy  I have discussed the findings and recommendations with the patient. Results were also provided in writing at the conclusion of the visit. If applicable, a reminder letter will be sent to the patient regarding the next appointment.  BI-RADS CATEGORY  4: Suspicious.   Electronically Signed   By: Conchita Paris M.D.   On: 10/24/2014 15:21   [place] Lt Breast Bx W Loc Dev 1st Lesion Img Bx Spec US Guide  10/26/2014   ADDENDUM REPORT: 10/26/2014 08:45  ADDENDUM: Pathology revealed grade I to II invasive ductal carcinoma in the left breast. This was found to be concordant by Dr. Conchita Paris. Pathology was discussed with the patient by telephone. She reported doing well after the biopsy. Post biopsy instructions and care were reviewed and her questions were answered. She has been scheduled at [facility] on November 02, 2014. The patient is encouraged to come to [facility] for educational materials. My number is provided for additional concerns and questions.  Pathology results reported by  Susa Raring RN, BSN on October 26, 2014.   Electronically Signed   By: Conchita Paris M.D.   On: 10/26/2014 08:45   10/26/2014   CLINICAL DATA:  Suspicious left breast mass eight o'clock location  EXAM: ULTRASOUND GUIDED left BREAST CORE NEEDLE BIOPSY  COMPARISON:  Previous exam(s).  PROCEDURE: I met with the patient and we discussed the procedure of ultrasound-guided biopsy, including benefits and alternatives. We discussed the high likelihood of a successful procedure. We discussed the risks of the procedure including infection, bleeding, tissue injury, clip migration, and inadequate sampling. Informed written consent was given. The usual time-out protocol was performed immediately prior to the procedure.  Using sterile technique and 2% Lidocaine as local anesthetic, under direct ultrasound visualization, a 12  gauge vacuum-assisted device was used to perform biopsy of left breast mass 8 o'clock locationusing a medial to lateral approach. At the conclusion of the procedure, a ribbon shaped tissue marker clip was deployed into the biopsy cavity. Follow-up 2-view mammogram was performed and dictated separately.  IMPRESSION: Ultrasound-guided biopsy of left breast mass 8 o'clock location with ribbon shaped clip placement. No apparent complications.  Electronically Signed: By: Conchita Paris M.D. On: 10/24/2014 15:21    ASSESSMENT: 74 y.o. [place] woman status post left breast biopsy 10/24/2014 for a clinical T1 80 N0 invasive ductal carcinoma, grade 1 or 2, estrogen and progesterone receptor positive, HER-2 negative, with an MIB-1 of 11%  (1) breast conserving surgery with no sentinel lymph node sampling  (2) consider anastrozole adjuvantly  (3) if the patient is unable to tolerate anastrozole, she will benefit from adjuvant radiation  (4) status post right modified radical mastectomy in 1994 what was at least a stage II breast cancer, with no adjuvant therapy given  (5) status post hysterectomy with unilateral salpingo-oophorectomy for endometrial cancer in the patient's 68s, no further data available  PLAN: We spent the better part of today's hour-long appointment discussing the biology of breast cancer in general, and the specifics of the patient's tumor in particular. [name] has multiple severe comorbidities which at up to multiple competing causes of death. I really do not believe this very small breast cancer is likely to be fatal to her even if all she does is surgery.  For that reason we are not considering chemotherapy and that being the case I think there is no need for sentinel lymph node sampling as far as systemic treatment is concerned. This would spare the patient the risk of lymphedema.  If she is able to easily tolerate anastrozole, that would be sensible because it would reduce the risk  of local and systemic recurrence and the risk of her developing another breast cancer in the contralateral breast. If she proved unable to tolerate this she would benefit from a brief course of radiation, perhaps as short as 3 weeks, purely for local control.  The patient does have a significant personal and family history of cancer and warrants genetics testing.  [name] has a good understanding of the overall plan. She agrees with it. She knows the goal of treatment in her case is cure. She will call with any problems that may develop before her next visit here.  Kim Cruel, MD   11/02/2014 2:57 PM Medical Oncology and Hematology [facility] [address] [place], [place] [address] Tel. [phone]    Fax. [phone]

## 2014-11-02 NOTE — Progress Notes (Signed)
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 Cancer of lower-inner quadrant of left female breast 174.3 C50.312     HISTORY OF PRESENT ILLNESS::[name] is a 74 y.o. female who presented with Left breast asymmetry on mammogram.  A 32m mass on [place] measured at 8:00.  Biopsy showed grade 1-2 IDC, ER PR positive Her(-). She has a strong family history and genetics referral is pending.  Prior history of right breast cancer (stage unknown) treated with mastectomy and removal of 13 LN's in 1990 - no adjuvant therapy. [place]  She had a stroke in 1985 and has right sided weakness. She smokes cigarettes.   PREVIOUS RADIATION THERAPY: No  PAST MEDICAL HISTORY:  has a past medical history of CVA (cerebral vascular accident) (1985); Tobacco abuse; Allergic rhinitis; Hypertension; Hyperlipidemia; Elevated hemoglobin A1c measurement; COPD (chronic obstructive pulmonary disease); Lymphocytosis; ASCVD (arteriosclerotic cardiovascular disease); CVA (cerebral vascular accident); Atherosclerosis of aorta (2014); Ovarian cyst; SVD (spontaneous vaginal delivery); Shortness of breath; Diabetes mellitus without complication; DJD (degenerative joint disease); Breast cancer (1990); Neuromuscular disorder; and Cancer of lower-inner quadrant of female breast (10/26/2014).    PAST SURGICAL HISTORY: Past Surgical History  Procedure Laterality Date  . Mastectomy  1990    Right, no adjuvant  . Cholecystectomy  2006  . Abdominal hysterectomy  1976  . Cholecystectomy N/A 2008    Dr. DRosana Hoes . Excision of  benign salivary tumor Left 1968  . Shoulder surgery Left 2007    Dr. MPercell Miller . Back surgery  2005    lower back  . Multiple tooth extractions    . Breast surgery  1990    right  . Bladder tact  1985  . Eye surgery      bilateral cataract eye surgery - now low vision  . Laparoscopic bilateral salpingo oopherectomy Bilateral 11/30/2013    Procedure: LAPAROSCOPIC RIGHT SALPINGO OOPHORECTOMY with extensive lysis of adhesions;  Surgeon: JAllena Katz MD;  [facility];  Service: Gynecology;  Laterality: Bilateral;    FAMILY HISTORY: family history includes Allergies in her brother; Asthma in her sister and sister; Cancer in her mother and sister; Emphysema in her brother, brother, and sister; Heart disease in her brother, mother, and sister. There is no history of Colon cancer.  SOCIAL HISTORY:  reports that she has been smoking Cigarettes.  She has a 110 pack-year smoking history. She uses smokeless tobacco. She reports that she does not drink alcohol or use illicit drugs.  ALLERGIES: Ace inhibitors; Amoxil; Bisoprolol; Ciprofloxacin; Crestor; Doxycycline; Enalapril; Lipitor; Metformin and related; Prednisone; Vicodin; and Acyclovir and related  MEDICATIONS:  Current Outpatient Prescriptions  Medication Sig Dispense Refill  . acetaminophen-codeine (TYLENOL #3) 300-30 MG per tablet Take 1-2 tablets by mouth every 6 (six) hours as needed. 60 tablet 0  . albuterol (PROVENTIL HFA;VENTOLIN HFA) 108 (90 BASE) MCG/ACT inhaler Inhale 2 puffs into the lungs every 6 (six) hours as needed for wheezing or shortness of breath.    .Marland Kitchenaspirin 81 MG tablet Take 81 mg by mouth daily.      .Marland Kitchen  Cholecalciferol (VITAMIN D3) 2000 UNITS TABS Take 3 tablets by mouth daily. 6000u per day    . fenofibrate micronized (LOFIBRA) 134 MG capsule TAKE ONE CAPSULE BY MOUTH ONCE DAILY 90 capsule 0  . fenofibrate micronized (LOFIBRA) 134 MG capsule TAKE ONE CAPSULE BY MOUTH ONCE DAILY 90 capsule 1  . fish oil-omega-3  fatty acids 1000 MG capsule Take 1 capsule by mouth 3 (three) times daily.     . Flaxseed, Linseed, (FLAX SEED OIL) 1000 MG CAPS Take 1 capsule by mouth 3 (three) times daily.      . fluticasone (FLONASE) 50 MCG/ACT nasal spray Place 2 sprays into both nostrils daily as needed for allergies or rhinitis.    . furosemide (LASIX) 20 MG tablet Take 1 tablet (20 mg total) by mouth daily. 30 tablet 3  . losartan (COZAAR) 100 MG tablet TAKE ONE TABLET BY MOUTH ONCE DAILY 90 tablet 1  . Magnesium 500 MG TABS Take 1 tablet by mouth 2 (two) times daily.     . metFORMIN (GLUCOPHAGE-XR) 500 MG 24 hr tablet TAKE FOUR TABLETS BY MOUTH ONCE DAILY FOR BLOOD SUGAR. 360 tablet 1  . tiotropium (SPIRIVA) 18 MCG inhalation capsule Place 18 mcg into inhaler and inhale daily as needed.     No current facility-administered medications for this encounter.    REVIEW OF SYSTEMS:  Notable for that above.   PHYSICAL EXAM:   Vitals with BMI 11/02/2014  Height 5' 3.5"  Weight 163 lbs 3 oz  BMI 45.8  Systolic 83  Diastolic 46  Pulse 73  Respirations 18   General: Alert and oriented, in no acute distress HEENT: Head is normocephalic. Extraocular movements are intact. Decreased vision. Oropharynx is clear. Neck: Neck is supple, no palpable cervical or supraclavicular lymphadenopathy. Heart: Regular in rate and rhythm with no murmurs, rubs, or gallops. Chest: Clear to auscultation bilaterally, with no rhonchi, wheezes, or rales. Abdomen: Soft, nontender, nondistended, with no rigidity or guarding. Extremities: RUE lymphedema sleeve Lymphatics: see Neck Exam Skin: No concerning lesions. Musculoskeletal: decreased RUE and RLE strength. Psychiatric: Judgment and insight are intact. Affect is appropriate. Breasts: LIQ biopsy changes, L breast. No palpable axillary nodes bilaterally.  Right chest wall without evidence of recurrence.  ECOG = 2  0 - Asymptomatic (Fully active, able to carry on all predisease activities  without restriction)  1 - Symptomatic but completely ambulatory (Restricted in physically strenuous activity but ambulatory and able to carry out work of a light or sedentary nature. For example, light housework, office work)  2 - Symptomatic, <50% in bed during the day (Ambulatory and capable of all self care but unable to carry out any work activities. Up and about more than 50% of waking hours)  3 - Symptomatic, >50% in bed, but not bedbound (Capable of only limited self-care, confined to bed or chair 50% or more of waking hours)  4 - Bedbound (Completely disabled. Cannot carry on any self-care. Totally confined to bed or chair)  5 - Death   Eustace Pen MM, Creech RH, Tormey DC, et al. [phone]). "Toxicity and response criteria of the [facility]". [place] Oncol. 5 (6): 649-55   LABORATORY DATA:  Lab Results  Component Value Date   WBC 9.6 11/02/2014   HGB 13.8 11/02/2014   HCT 40.9 11/02/2014   MCV 96.2 11/02/2014   PLT 243 11/02/2014   CMP     Component Value Date/Time   NA 138 11/02/2014 1204   NA 139  09/07/2014 1501   K 4.1 11/02/2014 1204   K 3.9 09/07/2014 1501   CL 105 09/07/2014 1501   CO2 23 11/02/2014 1204   CO2 22 09/07/2014 1501   GLUCOSE 214* 11/02/2014 1204   GLUCOSE 87 09/07/2014 1501   BUN 15.6 11/02/2014 1204   BUN 13 09/07/2014 1501   CREATININE 1.0 11/02/2014 1204   CREATININE 0.77 09/07/2014 1501   CREATININE 0.75 04/28/2008 1607   CALCIUM 10.3 11/02/2014 1204   CALCIUM 9.9 09/07/2014 1501   PROT 7.4 11/02/2014 1204   PROT 7.0 09/07/2014 1501   ALBUMIN 4.0 11/02/2014 1204   ALBUMIN 4.2 09/07/2014 1501   AST 23 11/02/2014 1204   AST 23 09/07/2014 1501   ALT 33 11/02/2014 1204   ALT 26 09/07/2014 1501   ALKPHOS 97 11/02/2014 1204   ALKPHOS 65 09/07/2014 1501   BILITOT 0.38 11/02/2014 1204   BILITOT 0.4 09/07/2014 1501   GFRNONAA 77 09/07/2014 1501   GFRNONAA >60 04/28/2008 1607   GFRAA 89 09/07/2014 1501   GFRAA   04/28/2008 1607    >60        The eGFR has been calculated using the MDRD equation. This calculation has not been validated in all clinical         RADIOGRAPHY: Mm Digital Diagnostic Unilat L  10/24/2014   CLINICAL DATA:  Clip placement after ultrasound-guided left breast biopsy  EXAM: DIAGNOSTIC left MAMMOGRAM POST ULTRASOUND BIOPSY  COMPARISON:  Previous exam(s).  FINDINGS: Mammographic images were obtained following ultrasound guided biopsy of left breast mass 8 o'clock location. The ribbon shaped clip is located 1 cm anterior to the biopsied mass in the left breast 8 o'clock location.  IMPRESSION: Ribbon shaped clip located 1 cm anterior to the biopsied mass in the left breast 8 o'clock location.  Final Assessment: Post Procedure Mammograms for Marker Placement   Electronically Signed   By: Conchita Paris M.D.   On: 10/24/2014 15:51   US Breast Ltd Uni Left Inc Axilla  10/24/2014   CLINICAL DATA:  Followup left breast mass  EXAM: DIGITAL DIAGNOSTIC left MAMMOGRAM WITH 3D TOMOSYNTHESIS WITH CAD  ULTRASOUND left BREAST  COMPARISON:  Prior exams  ACR Breast Density Category c: The breast tissue is heterogeneously dense, which may obscure small masses.  FINDINGS: Scattered benign-appearing calcifications are reidentified in the left breast. An irregular sub cm mass is identified in the left breast approximate 8 o'clock location, with a coarse peripheral calcification.  Mammographic images were processed with CAD.  On physical exam, I palpate no abnormality in the left lower inner quadrant.  Targeted ultrasound is performed, showing a hypoechoic mildly irregular left breast mass with peripheral coarse calcifications seen as corresponding to the mammographic appearance, left breast 8 o'clock location 6 cm from the nipple measuring 5 x 5 x 2 mm.  IMPRESSION: Suspicious left breast mass 8 o'clock location. Ultrasound-guided core biopsy will be performed today if the patient's request and dictated  separately.  RECOMMENDATION: Left breast ultrasound-guided core biopsy  I have discussed the findings and recommendations with the patient. Results were also provided in writing at the conclusion of the visit. If applicable, a reminder letter will be sent to the patient regarding the next appointment.  BI-RADS CATEGORY  4: Suspicious.   Electronically Signed   By: Conchita Paris M.D.   On: 10/24/2014 15:21   Mm Diag Breast Tomo Uni Left  10/24/2014   CLINICAL DATA:  Followup left breast mass  EXAM: DIGITAL DIAGNOSTIC left  MAMMOGRAM WITH 3D TOMOSYNTHESIS WITH CAD  ULTRASOUND left BREAST  COMPARISON:  Prior exams  ACR Breast Density Category c: The breast tissue is heterogeneously dense, which may obscure small masses.  FINDINGS: Scattered benign-appearing calcifications are reidentified in the left breast. An irregular sub cm mass is identified in the left breast approximate 8 o'clock location, with a coarse peripheral calcification.  Mammographic images were processed with CAD.  On physical exam, I palpate no abnormality in the left lower inner quadrant.  Targeted ultrasound is performed, showing a hypoechoic mildly irregular left breast mass with peripheral coarse calcifications seen as corresponding to the mammographic appearance, left breast 8 o'clock location 6 cm from the nipple measuring 5 x 5 x 2 mm.  IMPRESSION: Suspicious left breast mass 8 o'clock location. Ultrasound-guided core biopsy will be performed today if the patient's request and dictated separately.  RECOMMENDATION: Left breast ultrasound-guided core biopsy  I have discussed the findings and recommendations with the patient. Results were also provided in writing at the conclusion of the visit. If applicable, a reminder letter will be sent to the patient regarding the next appointment.  BI-RADS CATEGORY  4: Suspicious.   Electronically Signed   By: Conchita Paris M.D.   On: 10/24/2014 15:21   [place] Lt Breast Bx W Loc Dev 1st Lesion Img Bx Spec  US Guide  10/26/2014   ADDENDUM REPORT: 10/26/2014 08:45  ADDENDUM: Pathology revealed grade I to II invasive ductal carcinoma in the left breast. This was found to be concordant by Dr. Conchita Paris. Pathology was discussed with the patient by telephone. She reported doing well after the biopsy. Post biopsy instructions and care were reviewed and her questions were answered. She has been [facility] on November 02, 2014. The patient is encouraged to come to [facility] for educational materials. My number is provided for additional concerns and questions.  Pathology results reported by Susa Raring RN, BSN on October 26, 2014.   Electronically Signed   By: Conchita Paris M.D.   On: 10/26/2014 08:45   10/26/2014   CLINICAL DATA:  Suspicious left breast mass eight o'clock location  EXAM: ULTRASOUND GUIDED left BREAST CORE NEEDLE BIOPSY  COMPARISON:  Previous exam(s).  PROCEDURE: I met with the patient and we discussed the procedure of ultrasound-guided biopsy, including benefits and alternatives. We discussed the high likelihood of a successful procedure. We discussed the risks of the procedure including infection, bleeding, tissue injury, clip migration, and inadequate sampling. Informed written consent was given. The usual time-out protocol was performed immediately prior to the procedure.  Using sterile technique and 2% Lidocaine as local anesthetic, under direct ultrasound visualization, a 12 gauge vacuum-assisted device was used to perform biopsy of left breast mass 8 o'clock locationusing a medial to lateral approach. At the conclusion of the procedure, a ribbon shaped tissue marker clip was deployed into the biopsy cavity. Follow-up 2-view mammogram was performed and dictated separately.  IMPRESSION: Ultrasound-guided biopsy of left breast mass 8 o'clock location with ribbon shaped clip placement. No apparent complications.   Electronically Signed: By: Conchita Paris M.D. On: 10/24/2014 15:21      IMPRESSION/PLAN:  She has been discussed at our multidisciplinary tumor board.  The consensus is that she be a good candidate for breast conservation. I talked to her about the option of a mastectomy and informed her that her expected overall survival would be equivalent between mastectomy and breast conservation, based  upon randomized controlled data. She is enthusiastic about breast conservation.  She understands the most important part of her treatment is the surgery.  For the patient's early stage favorable risk breast cancer, we had a thorough discussion about her options for adjuvant therapy. One option would be antiestrogen therapy as discussed with medical oncology. She would take a pill for approximately 5 years. The alternative option would be radiotherapy to the breast.    We discussed the risks benefits and side effects of radiotherapy. She understands that the side effects would likely include some skin irritation and fatigue during the weeks of radiation. There is a risk of late effects which include but are not necessarily limited to cosmetic changes and rare heart or lung toxicity. I would anticipate delivering approximately 3-4 weeks of radiotherapy: I believe her anatomy is conducive to hypofractionation.  If she is a good candidate for antiestogen therapy, she will not need radiotherapy. I asked Dr. [name] to refer her back to me post operatively if needed.  Another option, of course, would be no adjuvant therapy which may be discussed further if the patient chooses minimal therapy. __________________________________________   Eppie Gibson, MD

## 2014-11-03 ENCOUNTER — Encounter (HOSPITAL_BASED_OUTPATIENT_CLINIC_OR_DEPARTMENT_OTHER): Payer: Self-pay | Admitting: *Deleted

## 2014-11-03 ENCOUNTER — Other Ambulatory Visit: Payer: Medicare HMO

## 2014-11-03 NOTE — Progress Notes (Signed)
Pt had cbc cmet-11/02/14-cxr and ekg done-copd controlled now-is not on oxygen-still smokes

## 2014-11-07 ENCOUNTER — Inpatient Hospital Stay: Admission: RE | Admit: 2014-11-07 | Payer: Medicare HMO | Source: Ambulatory Visit

## 2014-11-08 ENCOUNTER — Ambulatory Visit (HOSPITAL_BASED_OUTPATIENT_CLINIC_OR_DEPARTMENT_OTHER): Admission: RE | Admit: 2014-11-08 | Payer: Medicare HMO | Source: Ambulatory Visit | Admitting: General Surgery

## 2014-11-08 ENCOUNTER — Encounter (HOSPITAL_BASED_OUTPATIENT_CLINIC_OR_DEPARTMENT_OTHER): Payer: Self-pay | Admitting: Anesthesiology

## 2014-11-08 ENCOUNTER — Telehealth: Payer: Self-pay | Admitting: *Deleted

## 2014-11-08 ENCOUNTER — Ambulatory Visit: Payer: Medicare HMO

## 2014-11-08 SURGERY — BREAST LUMPECTOMY WITH RADIOACTIVE SEED LOCALIZATION
Anesthesia: Monitor Anesthesia Care | Site: Breast | Laterality: Left

## 2014-11-08 MED ORDER — FENTANYL CITRATE (PF) 100 MCG/2ML IJ SOLN
INTRAMUSCULAR | Status: AC
  Filled 2014-11-08: qty 6

## 2014-11-08 NOTE — Telephone Encounter (Signed)
Spoke with patient from [facility] 11/02/14.  She states she had diarrhea and had to cancel her surgery for today.  She states she is feeling better now.  We also cancelled her genetic testing when she was here for clinic per patient she does not want to have it done right now.   Encouraged her to call with any needs or concerns.

## 2014-11-18 ENCOUNTER — Other Ambulatory Visit: Payer: Self-pay | Admitting: *Deleted

## 2014-11-21 ENCOUNTER — Telehealth: Payer: Self-pay | Admitting: Oncology

## 2014-11-21 NOTE — Telephone Encounter (Signed)
Spoke with patient and she is aware of her appointment 5/26

## 2014-11-28 ENCOUNTER — Ambulatory Visit
Admission: RE | Admit: 2014-11-28 | Discharge: 2014-11-28 | Disposition: A | Discharge status: Home / Self Care | Payer: Medicare HMO | Source: Ambulatory Visit | Attending: General Surgery | Admitting: General Surgery

## 2014-11-28 DIAGNOSIS — C50912 Malignant neoplasm of unspecified site of left female breast: Secondary | ICD-10-CM

## 2014-11-29 MED ORDER — MIDAZOLAM HCL 2 MG/2ML IJ SOLN: 1.0000 mg | INTRAMUSCULAR | Status: DC | PRN

## 2014-11-29 MED ORDER — LACTATED RINGERS IV SOLN: INTRAVENOUS | Status: DC

## 2014-11-29 MED ORDER — CEFAZOLIN SODIUM-DEXTROSE 2-3 GM-% IV SOLR: 2.0000 g | INTRAVENOUS | Status: DC

## 2014-11-29 MED ORDER — FENTANYL CITRATE 0.05 MG/ML IJ SOLN: 50.0000 ug | INTRAMUSCULAR | Status: DC | PRN

## 2014-11-30 ENCOUNTER — Ambulatory Visit (HOSPITAL_COMMUNITY): Payer: Medicare HMO | Admitting: Certified Registered"

## 2014-11-30 ENCOUNTER — Ambulatory Visit
Admission: RE | Admit: 2014-11-30 | Discharge: 2014-11-30 | Disposition: A | Discharge status: Home / Self Care | Payer: Medicare HMO | Source: Ambulatory Visit | Attending: General Surgery | Admitting: General Surgery

## 2014-11-30 ENCOUNTER — Encounter (HOSPITAL_COMMUNITY)
Admission: RE | Disposition: A | Discharge status: Home / Self Care | Payer: Self-pay | Source: Ambulatory Visit | Attending: General Surgery

## 2014-11-30 ENCOUNTER — Ambulatory Visit (HOSPITAL_COMMUNITY)
Admission: RE | Admit: 2014-11-30 | Discharge: 2014-11-30 | Disposition: A | Discharge status: Home / Self Care | Payer: Medicare HMO | Source: Ambulatory Visit | Attending: General Surgery | Admitting: General Surgery

## 2014-11-30 ENCOUNTER — Encounter (HOSPITAL_COMMUNITY): Payer: Self-pay | Admitting: *Deleted

## 2014-11-30 DIAGNOSIS — Z85820 Personal history of malignant melanoma of skin: Secondary | ICD-10-CM | POA: Insufficient documentation

## 2014-11-30 DIAGNOSIS — Z8542 Personal history of malignant neoplasm of other parts of uterus: Secondary | ICD-10-CM | POA: Diagnosis not present

## 2014-11-30 DIAGNOSIS — Z9011 Acquired absence of right breast and nipple: Secondary | ICD-10-CM | POA: Insufficient documentation

## 2014-11-30 DIAGNOSIS — I10 Essential (primary) hypertension: Secondary | ICD-10-CM | POA: Diagnosis not present

## 2014-11-30 DIAGNOSIS — N6012 Diffuse cystic mastopathy of left breast: Secondary | ICD-10-CM | POA: Diagnosis not present

## 2014-11-30 DIAGNOSIS — J449 Chronic obstructive pulmonary disease, unspecified: Secondary | ICD-10-CM | POA: Diagnosis not present

## 2014-11-30 DIAGNOSIS — F17210 Nicotine dependence, cigarettes, uncomplicated: Secondary | ICD-10-CM | POA: Diagnosis not present

## 2014-11-30 DIAGNOSIS — Z170 Estrogen receptor positive status [ER+]: Secondary | ICD-10-CM | POA: Insufficient documentation

## 2014-11-30 DIAGNOSIS — C50912 Malignant neoplasm of unspecified site of left female breast: Secondary | ICD-10-CM | POA: Diagnosis present

## 2014-11-30 DIAGNOSIS — R920 Mammographic microcalcification found on diagnostic imaging of breast: Secondary | ICD-10-CM | POA: Insufficient documentation

## 2014-11-30 DIAGNOSIS — K7460 Unspecified cirrhosis of liver: Secondary | ICD-10-CM | POA: Insufficient documentation

## 2014-11-30 DIAGNOSIS — M1990 Unspecified osteoarthritis, unspecified site: Secondary | ICD-10-CM | POA: Diagnosis not present

## 2014-11-30 DIAGNOSIS — R1032 Left lower quadrant pain: Secondary | ICD-10-CM

## 2014-11-30 DIAGNOSIS — Z8673 Personal history of transient ischemic attack (TIA), and cerebral infarction without residual deficits: Secondary | ICD-10-CM | POA: Diagnosis not present

## 2014-11-30 DIAGNOSIS — Z803 Family history of malignant neoplasm of breast: Secondary | ICD-10-CM | POA: Diagnosis not present

## 2014-11-30 DIAGNOSIS — C50312 Malignant neoplasm of lower-inner quadrant of left female breast: Secondary | ICD-10-CM | POA: Diagnosis not present

## 2014-11-30 DIAGNOSIS — E119 Type 2 diabetes mellitus without complications: Secondary | ICD-10-CM | POA: Diagnosis not present

## 2014-11-30 DIAGNOSIS — I739 Peripheral vascular disease, unspecified: Secondary | ICD-10-CM | POA: Insufficient documentation

## 2014-11-30 HISTORY — PX: BREAST LUMPECTOMY WITH RADIOACTIVE SEED LOCALIZATION: SHX6424

## 2014-11-30 LAB — GLUCOSE, CAPILLARY
GLUCOSE-CAPILLARY: 140 mg/dL — AB (ref 70–99)
Glucose-Capillary: 139 mg/dL — ABNORMAL HIGH (ref 70–99)

## 2014-11-30 LAB — CBC
HCT: 39.7 % (ref 36.0–46.0)
Hemoglobin: 13.6 g/dL (ref 12.0–15.0)
MCH: 32.7 pg (ref 26.0–34.0)
MCHC: 34.3 g/dL (ref 30.0–36.0)
MCV: 95.4 fL (ref 78.0–100.0)
Platelets: 270 10*3/uL (ref 150–400)
RBC: 4.16 MIL/uL (ref 3.87–5.11)
RDW: 12.9 % (ref 11.5–15.5)
WBC: 8.6 10*3/uL (ref 4.0–10.5)

## 2014-11-30 LAB — BASIC METABOLIC PANEL
Anion gap: 10 (ref 5–15)
BUN: 14 mg/dL (ref 6–20)
CHLORIDE: 107 mmol/L (ref 101–111)
CO2: 21 mmol/L — AB (ref 22–32)
Calcium: 10.2 mg/dL (ref 8.9–10.3)
Creatinine, Ser: 0.84 mg/dL (ref 0.44–1.00)
GFR calc non Af Amer: >60 mL/min (ref >60)
Glucose, Bld: 164 mg/dL — ABNORMAL HIGH (ref 70–99)
Potassium: 3.8 mmol/L (ref 3.5–5.1)
SODIUM: 138 mmol/L (ref 135–145)

## 2014-11-30 SURGERY — BREAST LUMPECTOMY WITH RADIOACTIVE SEED LOCALIZATION
Anesthesia: Monitor Anesthesia Care | Site: Breast | Laterality: Left

## 2014-11-30 MED ORDER — LIDOCAINE HCL 1 % IJ SOLN
INTRAMUSCULAR | Status: DC | PRN
  Administered 2014-11-30: 13 mL via INTRAMUSCULAR

## 2014-11-30 MED ORDER — FENTANYL CITRATE (PF) 250 MCG/5ML IJ SOLN
INTRAMUSCULAR | Status: AC
  Filled 2014-11-30: qty 5

## 2014-11-30 MED ORDER — LIDOCAINE HCL (CARDIAC) 20 MG/ML IV SOLN
INTRAVENOUS | Status: AC
  Filled 2014-11-30: qty 5

## 2014-11-30 MED ORDER — PROPOFOL 10 MG/ML IV BOLUS
INTRAVENOUS | Status: AC
  Filled 2014-11-30: qty 20

## 2014-11-30 MED ORDER — PROPOFOL 10 MG/ML IV BOLUS
INTRAVENOUS | Status: DC | PRN
  Administered 2014-11-30: 120 mg via INTRAVENOUS

## 2014-11-30 MED ORDER — LIDOCAINE HCL (CARDIAC) 20 MG/ML IV SOLN
INTRAVENOUS | Status: DC | PRN
  Administered 2014-11-30: 40 mg via INTRAVENOUS

## 2014-11-30 MED ORDER — CEFAZOLIN SODIUM-DEXTROSE 2-3 GM-% IV SOLR
INTRAVENOUS | Status: AC
  Filled 2014-11-30: qty 100

## 2014-11-30 MED ORDER — CEFAZOLIN SODIUM-DEXTROSE 2-3 GM-% IV SOLR
INTRAVENOUS | Status: DC | PRN
  Administered 2014-11-30: 2 g via INTRAVENOUS

## 2014-11-30 MED ORDER — SUCCINYLCHOLINE CHLORIDE 20 MG/ML IJ SOLN
INTRAMUSCULAR | Status: AC
  Filled 2014-11-30: qty 1

## 2014-11-30 MED ORDER — FENTANYL CITRATE (PF) 100 MCG/2ML IJ SOLN
INTRAMUSCULAR | Status: DC | PRN
  Administered 2014-11-30: 25 ug via INTRAVENOUS
  Administered 2014-11-30: 50 ug via INTRAVENOUS
  Administered 2014-11-30: 25 ug via INTRAVENOUS

## 2014-11-30 MED ORDER — LACTATED RINGERS IV SOLN
INTRAVENOUS | Status: DC | PRN
  Administered 2014-11-30: 08:00:00 via INTRAVENOUS

## 2014-11-30 MED ORDER — MIDAZOLAM HCL 2 MG/2ML IJ SOLN
INTRAMUSCULAR | Status: AC
  Filled 2014-11-30: qty 2

## 2014-11-30 MED ORDER — BUPIVACAINE-EPINEPHRINE (PF) 0.25% -1:200000 IJ SOLN
INTRAMUSCULAR | Status: AC
  Filled 2014-11-30: qty 30

## 2014-11-30 MED ORDER — PHENYLEPHRINE 40 MCG/ML (10ML) SYRINGE FOR IV PUSH (FOR BLOOD PRESSURE SUPPORT)
PREFILLED_SYRINGE | INTRAVENOUS | Status: AC
  Filled 2014-11-30: qty 10

## 2014-11-30 MED ORDER — LIDOCAINE HCL (CARDIAC) 20 MG/ML IV SOLN
INTRAVENOUS | Status: AC
  Filled 2014-11-30: qty 10

## 2014-11-30 MED ORDER — 0.9 % SODIUM CHLORIDE (POUR BTL) OPTIME
TOPICAL | Status: DC | PRN
  Administered 2014-11-30: 1000 mL

## 2014-11-30 MED ORDER — ROCURONIUM BROMIDE 50 MG/5ML IV SOLN
INTRAVENOUS | Status: AC
  Filled 2014-11-30: qty 1

## 2014-11-30 MED ORDER — SODIUM CHLORIDE 0.9 % IJ SOLN
INTRAMUSCULAR | Status: AC
  Filled 2014-11-30: qty 10

## 2014-11-30 MED ORDER — ONDANSETRON HCL 4 MG/2ML IJ SOLN
INTRAMUSCULAR | Status: AC
  Filled 2014-11-30: qty 2

## 2014-11-30 MED ORDER — ONDANSETRON HCL 4 MG/2ML IJ SOLN
INTRAMUSCULAR | Status: DC | PRN
  Administered 2014-11-30: 4 mg via INTRAVENOUS

## 2014-11-30 MED ORDER — ACETAMINOPHEN-CODEINE #3 300-30 MG PO TABS: 1.0000 | ORAL_TABLET | ORAL | Status: DC | PRN

## 2014-11-30 MED ORDER — NEOSTIGMINE METHYLSULFATE 10 MG/10ML IV SOLN
INTRAVENOUS | Status: AC
  Filled 2014-11-30: qty 1

## 2014-11-30 MED ORDER — EPHEDRINE SULFATE 50 MG/ML IJ SOLN
INTRAMUSCULAR | Status: AC
  Filled 2014-11-30: qty 1

## 2014-11-30 SURGICAL SUPPLY — 53 items
ADH SKN CLS APL DERMABOND .7 (GAUZE/BANDAGES/DRESSINGS) ×1
APPLIER CLIP 9.375 MED OPEN (MISCELLANEOUS)
APR CLP MED 9.3 20 MLT OPN (MISCELLANEOUS)
BINDER BREAST LRG (GAUZE/BANDAGES/DRESSINGS) IMPLANT
BINDER BREAST XLRG (GAUZE/BANDAGES/DRESSINGS) ×2 IMPLANT
BLADE SURG 15 STRL LF DISP TIS (BLADE) ×1 IMPLANT
BLADE SURG 15 STRL SS (BLADE) ×3
CANISTER SUCTION 2500CC (MISCELLANEOUS) IMPLANT
CHLORAPREP W/TINT 26ML (MISCELLANEOUS) ×3 IMPLANT
CLIP APPLIE 9.375 MED OPEN (MISCELLANEOUS) IMPLANT
CLOSURE WOUND 1/2 X4 (GAUZE/BANDAGES/DRESSINGS) ×1
COVER PROBE W GEL 5X96 (DRAPES) ×3 IMPLANT
DERMABOND ADVANCED (GAUZE/BANDAGES/DRESSINGS) ×2
DERMABOND ADVANCED .7 DNX12 (GAUZE/BANDAGES/DRESSINGS) IMPLANT
DEVICE DUBIN SPECIMEN MAMMOGRA (MISCELLANEOUS) ×3 IMPLANT
DRAPE CHEST BREAST 15X10 FENES (DRAPES) IMPLANT
DRAPE PED LAPAROTOMY (DRAPES) ×2 IMPLANT
DRSG PAD ABDOMINAL 8X10 ST (GAUZE/BANDAGES/DRESSINGS) ×2 IMPLANT
ELECT CAUTERY BLADE 6.4 (BLADE) ×3 IMPLANT
ELECT REM PT RETURN 9FT ADLT (ELECTROSURGICAL) ×3
ELECTRODE REM PT RTRN 9FT ADLT (ELECTROSURGICAL) ×1 IMPLANT
GAUZE SPONGE 4X4 12PLY STRL (GAUZE/BANDAGES/DRESSINGS) ×2 IMPLANT
GLOVE BIO SURGEON STRL SZ 6 (GLOVE) ×3 IMPLANT
GLOVE BIO SURGEON STRL SZ7 (GLOVE) ×9 IMPLANT
GLOVE BIOGEL PI IND STRL 6.5 (GLOVE) ×1 IMPLANT
GLOVE BIOGEL PI IND STRL 7.0 (GLOVE) IMPLANT
GLOVE BIOGEL PI INDICATOR 6.5 (GLOVE) ×2
GLOVE BIOGEL PI INDICATOR 7.0 (GLOVE) ×4
GLOVE SURG SS PI 7.5 STRL IVOR (GLOVE) ×2 IMPLANT
GOWN STRL REUS W/ TWL LRG LVL3 (GOWN DISPOSABLE) ×1 IMPLANT
GOWN STRL REUS W/TWL 2XL LVL3 (GOWN DISPOSABLE) ×3 IMPLANT
GOWN STRL REUS W/TWL LRG LVL3 (GOWN DISPOSABLE) ×6
KIT BASIN OR (CUSTOM PROCEDURE TRAY) ×3 IMPLANT
KIT MARKER MARGIN INK (KITS) ×3 IMPLANT
NEEDLE HYPO 25X1 1.5 SAFETY (NEEDLE) ×3 IMPLANT
NS IRRIG 1000ML POUR BTL (IV SOLUTION) IMPLANT
PACK SURGICAL SETUP 50X90 (CUSTOM PROCEDURE TRAY) ×3 IMPLANT
PEN SKIN MARKING BROAD (MISCELLANEOUS) ×2 IMPLANT
PENCIL BUTTON HOLSTER BLD 10FT (ELECTRODE) ×3 IMPLANT
SPONGE LAP 18X18 X RAY DECT (DISPOSABLE) ×3 IMPLANT
STRIP CLOSURE SKIN 1/2X4 (GAUZE/BANDAGES/DRESSINGS) ×2 IMPLANT
SUT MNCRL AB 4-0 PS2 18 (SUTURE) ×3 IMPLANT
SUT SILK 2 0 SH (SUTURE) IMPLANT
SUT VIC AB 2-0 SH 27 (SUTURE) ×3
SUT VIC AB 2-0 SH 27XBRD (SUTURE) ×1 IMPLANT
SUT VIC AB 3-0 SH 27 (SUTURE) ×3
SUT VIC AB 3-0 SH 27X BRD (SUTURE) ×1 IMPLANT
SYR CONTROL 10ML LL (SYRINGE) ×3 IMPLANT
TOWEL OR 17X24 6PK STRL BLUE (TOWEL DISPOSABLE) IMPLANT
TOWEL OR 17X26 10 PK STRL BLUE (TOWEL DISPOSABLE) ×3 IMPLANT
TUBE CONNECTING 12'X1/4 (SUCTIONS) ×1
TUBE CONNECTING 12X1/4 (SUCTIONS) ×1 IMPLANT
YANKAUER SUCT BULB TIP NO VENT (SUCTIONS) ×2 IMPLANT

## 2014-11-30 NOTE — H&P (Signed)
[name] 11/02/2014 7:48 AM Location: [facility] Patient #: [id number] DOB: Nov 12, 1940 Undefined / Language: [name] / Race: Undefined Female  History of Present Illness Kim Klein MD; 11/02/2014 4:09 PM) The patient is a 74 year old female who presents with breast cancer. Patient is a 74 year old female who is referred for consultation for a new diagnosis of left breast cancer. The patient presented with interval 6 month follow-up on tomography for breast asymmetry. She subsequently underwent diagnostic mammography and ultrasound and was found to have a 5 x 5 x 2 mm mass in the lower inner quadrant of the left breast. Core needle biopsy was performed and demonstrated a grade 1-2 invasive ductal carcinoma that is hormone receptor positive, HER-2 not overexpressed, and Ki-67 11%. Of note, she has a history of a mastectomy on the right in 1990. She also has a history of uterine cancer at age 58. Her mother had breast cancer at age 34 and she had 2 sisters with breast cancer in their 1s. She has had a stroke with subsequent right arm and right leg weakness. The right arm is worse than the leg. She is a smoker and smokes around half pack per day. Does not use illicit drugs and does not use alcohol. She had menarche at age 10. She is not having periods. She had 2 children with her first at age 40. She did use hormonal contraception for 3 years. She had a colonoscopy in 2015 and also has had a bone density study.   Other Problems [name], Gray; 11/02/2014 7:49 AM) Arthritis Back Pain Bladder Problems Breast Cancer Cerebrovascular Accident Cholelithiasis Chronic Obstructive Lung Disease Cirrhosis Of Liver Diabetes Mellitus Diverticulosis General anesthesia - complications High blood pressure Hypercholesterolemia Lump In Breast Melanoma Oophorectomy  Past Surgical History [name], Gray; 11/02/2014 7:49 AM) Appendectomy Breast Biopsy  Bilateral. multiple Cataract Surgery Bilateral. Gallbladder Surgery - Laparoscopic Hysterectomy (due to cancer) - Partial Mastectomy Right. Oral Surgery Sentinel Lymph Node Biopsy Shoulder Surgery Left. Spinal Surgery - Lower Back  Diagnostic Studies History [name], [place]; 11/02/2014 7:49 AM) Colonoscopy 1-5 years ago Mammogram within last year Pap Smear >5 years ago  Social History [name], Gray; 11/02/2014 7:49 AM) Caffeine use Coffee, Tea. No alcohol use No drug use Tobacco use Current every day smoker.  Family History [name], [place]; 11/02/2014 7:49 AM) Alcohol Abuse Family Members In General, Father. Arthritis Brother, Family Members In General, Father, Mother. Breast Cancer Mother, Sister. Diabetes Mellitus Brother, Family Members In [place], Sister. Heart Disease Brother, Mother, Sister. Heart disease in female family member before age 24 Hypertension Brother, Sister. Respiratory Condition Brother, Sister. Thyroid problems Brother, Sister.  Pregnancy / Birth History [name], [place]; 11/02/2014 7:49 AM) Age at menarche 40 years. Age of menopause <45 Contraceptive History Oral contraceptives. Gravida 2 Irregular periods Maternal age 69-20 Para 2  Review of Systems [name]; 11/02/2014 7:49 AM) General Present- Fatigue, Night Sweats and Weight Loss. Not Present- Appetite Loss, Chills, Fever and Weight Gain. Skin Present- Change in Wart/Mole and Dryness. Not Present- Hives, Jaundice, New Lesions, Non-Healing Wounds, Rash and Ulcer. HEENT Present- Earache, Ringing in the Ears, Seasonal Allergies and Sinus Pain. Not Present- Hearing Loss, Hoarseness, Nose Bleed, Oral Ulcers, Sore Throat, Visual Disturbances, Wears glasses/contact lenses and Yellow Eyes. Respiratory Present- Chronic Cough, Difficulty Breathing and Wheezing. Not Present- Bloody sputum and Snoring. Breast Not Present- Breast Mass, Breast Pain,  Nipple Discharge and Skin Changes. Cardiovascular Present- Difficulty Breathing Lying Down, Palpitations  and Shortness of Breath. Not Present- Chest Pain, Leg Cramps, Rapid Heart Rate and Swelling of Extremities. Gastrointestinal Not Present- Abdominal Pain, Bloating, Bloody Stool, Change in Bowel Habits, Chronic diarrhea, Constipation, Difficulty Swallowing, Excessive gas, Gets full quickly at meals, Hemorrhoids, Indigestion, Nausea, Rectal Pain and Vomiting. Female Genitourinary Not Present- Frequency, Nocturia, Painful Urination, Pelvic Pain and Urgency. Musculoskeletal Present- Back Pain and Joint Pain. Not Present- Joint Stiffness, Muscle Pain, Muscle Weakness and Swelling of Extremities. Neurological Present- Trouble walking and Weakness. Not Present- Decreased Memory, Fainting, Headaches, Numbness, Seizures, Tingling and Tremor. Psychiatric Present- Anxiety. Not Present- Bipolar, Change in Sleep Pattern, Depression, Fearful and Frequent crying. Hematology Not Present- Easy Bruising, Excessive bleeding, Gland problems, HIV and Persistent Infections.   Physical Exam Kim Klein MD; 11/02/2014 4:13 PM) General Mental Status-Alert. General Appearance-Consistent with stated age. Hydration-Well hydrated. Voice-Normal.  Head and Neck Head-normocephalic, atraumatic with no lesions or palpable masses. Trachea-midline. Thyroid Gland Characteristics - normal size and consistency.  Eye Eyeball - Bilateral-Extraocular movements intact. Sclera/Conjunctiva - Bilateral-No scleral icterus.  Chest and Lung Exam Chest and lung exam reveals -quiet, even and easy respiratory effort with no use of accessory muscles and on auscultation, normal breath sounds, no adventitious sounds and normal vocal resonance. Inspection Chest Wall - Normal. Back - normal.  Breast Note: right breast surgically absent. Left breast without palpable mass. no nipple retraction or skin dimpling. no  lymphadenopathy.   Cardiovascular Cardiovascular examination reveals -normal heart sounds, regular rate and rhythm with no murmurs and normal pedal pulses bilaterally.  Abdomen Inspection Inspection of the abdomen reveals - No Hernias. Palpation/Percussion Palpation and Percussion of the abdomen reveal - Soft, Non Tender, No Rebound tenderness, No Rigidity (guarding) and No hepatosplenomegaly. Auscultation Auscultation of the abdomen reveals - Bowel sounds normal.  Neurologic Neurologic evaluation reveals -alert and oriented x 3 with no impairment of recent or remote memory. Mental Status-Normal.  Musculoskeletal Global Assessment -Note: no gross deformities.  Normal Exam - Left-Upper Extremity Strength Normal and Lower Extremity Strength Normal. Normal Exam - Right-Upper Extremity Weakness, Lower Extremity Weakness. Note: right upper arm in compression sleeves.  Lymphatic Head & Neck  General Head & Neck Lymphatics: Bilateral - Description - Normal. Axillary  General Axillary Region: Bilateral - Description - Normal. Tenderness - Non Tender. Femoral & Inguinal  Generalized Femoral & Inguinal Lymphatics: Bilateral - Description - No Generalized lymphadenopathy.    Assessment & Plan Kim Klein MD; 11/02/2014 4:16 PM) PRIMARY CANCER OF LOWER INNER QUADRANT OF LEFT BREAST (174.3  C50.312) Impression: Patient has a very small Stage I breast cancer. She is a good candidate for breast conservation.  Because of her age, comorbidities, prior lymphedema on the right and desire to avoid general anesthesia, we are not going to do a sentinel lymph node biopsy. I reviewed the risks of surgery including bleeding and infection. I reviewed the process by which we did the lumpectomy with the seed localization. I also discussed the risk of pain, seroma, heart or lung complications, or other wound healing issues. I counseled the patient to stop smoking.  She understands and  wishes to proceed.  45 min spent in evaluation, examination, counseling, and coordination of care. >50% spent in counseling.     Signed by Kim Klein, MD (11/02/2014 4:17 PM)

## 2014-11-30 NOTE — Interval H&P Note (Signed)
History and Physical Interval Note:  11/30/2014 8:33 AM  [name]  has presented today for surgery, with the diagnosis of LEFT BREAST CANCER  The various methods of treatment have been discussed with the patient and family. After consideration of risks, benefits and other options for treatment, the patient has consented to  Procedure(s): LEFT BREAST LUMPECTOMY WITH RADIOACTIVE SEED LOCALIZATION (Left) as a surgical intervention .  The patient's history has been reviewed, patient examined, no change in status, stable for surgery.  I have reviewed the patient's chart and labs.  Questions were answered to the patient's satisfaction.     [name]

## 2014-11-30 NOTE — Transfer of Care (Signed)
Immediate Anesthesia Transfer of Care Note  Patient: [name]  Procedure(s) Performed: Procedure(s): LEFT BREAST LUMPECTOMY WITH RADIOACTIVE SEED LOCALIZATION (Left)  Patient Location: PACU  Anesthesia Type:General  Level of Consciousness: awake, alert  and oriented  Airway & Oxygen Therapy: Patient Spontanous Breathing and Patient connected to nasal cannula oxygen  Post-op Assessment: Report given to RN and Post -op Vital signs reviewed and stable  Post vital signs: Reviewed and stable  Last Vitals:  Filed Vitals:   11/30/14 0657  BP: 108/50  Pulse:   Temp:   Resp:     Complications: No apparent anesthesia complications

## 2014-11-30 NOTE — Anesthesia Postprocedure Evaluation (Signed)
  Anesthesia Post-op Note  Patient: [name]  Procedure(s) Performed: Procedure(s) (LRB): LEFT BREAST LUMPECTOMY WITH RADIOACTIVE SEED LOCALIZATION (Left)  Patient Location: PACU  Anesthesia Type: General  Level of Consciousness: awake and alert   Airway and Oxygen Therapy: Patient Spontanous Breathing  Post-op Pain: mild  Post-op Assessment: Post-op Vital signs reviewed, Patient's Cardiovascular Status Stable, Respiratory Function Stable, Patent Airway and No signs of Nausea or vomiting  Last Vitals:  Filed Vitals:   11/30/14 1018  BP: 136/72  Pulse: 61  Temp:   Resp:     Post-op Vital Signs: stable   Complications: No apparent anesthesia complications

## 2014-11-30 NOTE — Anesthesia Preprocedure Evaluation (Addendum)
Anesthesia Evaluation  Patient identified by MRN, date of birth, ID band Patient awake    Reviewed: Allergy & Precautions, H&P , NPO status , Patient's Chart, lab work & pertinent test results  Airway Mallampati: II  TM Distance: >3 FB Neck ROM: Full    Dental no notable dental hx. (+) Edentulous Upper, Edentulous Lower, Dental Advisory Given   Pulmonary COPD COPD inhaler, Current Smoker,  breath sounds clear to auscultation  Pulmonary exam normal       Cardiovascular hypertension, Pt. on medications + Peripheral Vascular Disease Rhythm:Regular Rate:Normal     Neuro/Psych CVA negative psych ROS   GI/Hepatic negative GI ROS, Neg liver ROS,   Endo/Other  diabetes, Type 2, Oral Hypoglycemic Agents  Renal/GU negative Renal ROS  negative genitourinary   Musculoskeletal   Abdominal   Peds  Hematology negative hematology ROS (+)   Anesthesia Other Findings   Reproductive/Obstetrics negative OB ROS                          Anesthesia Physical Anesthesia Plan  ASA: III  Anesthesia Plan: General   Post-op Pain Management:    Induction: Intravenous  Airway Management Planned: LMA  Additional Equipment:   Intra-op Plan:   Post-operative Plan: Extubation in OR  Informed Consent: I have reviewed the patients History and Physical, chart, labs and discussed the procedure including the risks, benefits and alternatives for the proposed anesthesia with the patient or authorized representative who has indicated his/her understanding and acceptance.   Dental advisory given  Plan Discussed with: CRNA  Anesthesia Plan Comments:        Anesthesia Quick Evaluation

## 2014-11-30 NOTE — Discharge Instructions (Signed)
[facility] °Office Phone Number [phone] ° °BREAST BIOPSY/ PARTIAL MASTECTOMY: POST OP INSTRUCTIONS ° °Always review your discharge instruction sheet given to you by the facility where your surgery was performed. ° °IF YOU HAVE DISABILITY OR FAMILY LEAVE FORMS, YOU MUST BRING THEM TO THE OFFICE FOR PROCESSING.  DO NOT GIVE THEM TO YOUR DOCTOR. ° °1. A prescription for pain medication may be given to you upon discharge.  Take your pain medication as prescribed, if needed.  If narcotic pain medicine is not needed, then you may take acetaminophen (Tylenol) or ibuprofen (Advil) as needed. °2. Take your usually prescribed medications unless otherwise directed °3. If you need a refill on your pain medication, please contact your pharmacy.  They will contact our office to request authorization.  Prescriptions will not be filled after 5pm or on week-ends. °4. You should eat very light the first 24 hours after surgery, such as soup, crackers, pudding, etc.  Resume your normal diet the day after surgery. °5. Most patients will experience some swelling and bruising in the breast.  Ice packs and a good support bra will help.  Swelling and bruising can take several days to resolve.  °6. It is common to experience some constipation if taking pain medication after surgery.  Increasing fluid intake and taking a stool softener will usually help or prevent this problem from occurring.  A mild laxative (Milk of Magnesia or Miralax) should be taken according to package directions if there are no bowel movements after 48 hours. °7. Unless discharge instructions indicate otherwise, you may remove your bandages 48 hours after surgery, and you may shower at that time.  You may have steri-strips (small skin tapes) in place directly over the incision.  These strips should be left on the skin for 7-10 days.   Any sutures or staples will be removed at the office during your follow-up visit. °8. ACTIVITIES:  You may resume  regular daily activities (gradually increasing) beginning the next day.  Wearing a good support bra or sports bra (or the breast binder) minimizes pain and swelling.  You may have sexual intercourse when it is comfortable. °a. You may drive when you no longer are taking prescription pain medication, you can comfortably wear a seatbelt, and you can safely maneuver your car and apply brakes. °b. RETURN TO WORK:  __________1 week_______________ °9. You should see your doctor in the office for a follow-up appointment approximately two weeks after your surgery.  Your doctor’s nurse will typically make your follow-up appointment when she calls you with your pathology report.  Expect your pathology report 2-3 business days after your surgery.  You may call to check if you do not hear from us after three days. ° ° °WHEN TO CALL YOUR DOCTOR: °1. Fever over 101.0 °2. Nausea and/or vomiting. °3. Extreme swelling or bruising. °4. Continued bleeding from incision. °5. Increased pain, redness, or drainage from the incision. ° °The clinic staff is available to answer your questions during regular business hours.  Please don’t hesitate to call and ask to speak to one of the nurses for clinical concerns.  If you have a medical emergency, go to the nearest emergency room or call 911.  A surgeon from [facility] is always on call at the hospital. ° °For further questions, please visit [email or web address]  ° °

## 2014-11-30 NOTE — Anesthesia Procedure Notes (Signed)
Procedure Name: LMA Insertion Date/Time: 11/30/2014 8:59 AM Performed by: Merdis Delay Pre-anesthesia Checklist: Patient identified, Timeout performed, Emergency Drugs available, Suction available and Patient being monitored Patient Re-evaluated:Patient Re-evaluated prior to inductionOxygen Delivery Method: Circle system utilized Preoxygenation: Pre-oxygenation with 100% oxygen Intubation Type: IV induction LMA: LMA inserted LMA Size: 4.0 Number of attempts: 1 Placement Confirmation: positive ETCO2,  CO2 detector and breath sounds checked- equal and bilateral Tube secured with: Tape Dental Injury: Teeth and Oropharynx as per pre-operative assessment

## 2014-11-30 NOTE — Op Note (Signed)
Left Breast Radioactive seed localized lumpectomy  Indications: This patient presents with history of left breast cancer, lower inner quadrant  Pre-operative Diagnosis: left breast cancer, cT1N0  Post-operative Diagnosis: Same  Surgeon: [name]   Assistant:  Ariel Hilsinger, PA-S  Anesthesia: General endotracheal anesthesia  ASA Class: 3  Procedure Details  The patient was seen in the Holding Room. The risks, benefits, complications, treatment options, and expected outcomes were discussed with the patient. The possibilities of bleeding, infection, the need for additional procedures, failure to diagnose a condition, and creating a complication requiring transfusion or operation were discussed with the patient. The patient concurred with the proposed plan, giving informed consent.  The site of surgery properly noted/marked. The patient was taken to Operating Room # 1, identified, and the procedure verified as Left Breast Lumpectomy. A Time Out was held and the above information confirmed.  The left breast, and chest were prepped and draped in standard fashion. The lumpectomy was performed by creating an transverse incision over the lower inner quadrant of the breast over the previously placed radioactive seed.  Dissection was carried down to around the point of maximum signal intensity. The cautery was used to perform the dissection.  Hemostasis was achieved with cautery. The edges of the cavity were marked with large clips, with one each medial, lateral, inferior and superior, and two clips posteriorly.   The posterior margin is chest wall, the anterior,  inferior and medial margins are skin.  The specimen was inked with the margin marker paint kit.    Specimen radiography confirmed inclusion of the mammographic lesion, the clip, and the seed.  The background signal in the breast was zero.  The wound was irrigated and closed with 3-0 vicryl in layers and 4-0 monocryl subcuticular suture.       Sterile dressings were applied. At the end of the operation, all sponge, instrument, and needle counts were correct.  Findings: grossly clear surgical margins and no adenopathy  Estimated Blood Loss:  min         Specimens: left breast lumpectomy.           Complications:  None; patient tolerated the procedure well.         Disposition: PACU - hemodynamically stable.         Condition: stable

## 2014-12-01 ENCOUNTER — Encounter (HOSPITAL_COMMUNITY): Payer: Self-pay | Admitting: General Surgery

## 2014-12-01 NOTE — Progress Notes (Signed)
Quick Note:  Please let patient know margins are negative and cancer is only 5 mm big. ______

## 2014-12-06 ENCOUNTER — Telehealth: Payer: Self-pay | Admitting: Oncology

## 2014-12-06 NOTE — Telephone Encounter (Signed)
Patient husband & wife called and stated they do not need a follow up at this time per surgen.

## 2014-12-09 ENCOUNTER — Encounter: Payer: Self-pay | Admitting: *Deleted

## 2014-12-09 ENCOUNTER — Telehealth: Payer: Self-pay | Admitting: Oncology

## 2014-12-09 NOTE — Telephone Encounter (Signed)
Left message to confirm appointment for 06/09.

## 2014-12-15 ENCOUNTER — Ambulatory Visit: Payer: Medicare HMO | Admitting: Oncology

## 2014-12-21 ENCOUNTER — Ambulatory Visit: Payer: Medicare HMO | Admitting: Radiation Oncology

## 2014-12-21 ENCOUNTER — Ambulatory Visit: Payer: Medicare HMO

## 2014-12-23 NOTE — Addendum Note (Signed)
Addendum  created 12/23/14 1925 by Myrtie Soman, MD   Modules edited: Anesthesia Responsible Staff

## 2014-12-27 NOTE — Addendum Note (Signed)
Addendum  created 12/27/14 1303 by Roderic Palau, MD   Modules edited: Anesthesia Responsible Staff

## 2014-12-28 ENCOUNTER — Ambulatory Visit (INDEPENDENT_AMBULATORY_CARE_PROVIDER_SITE_OTHER): Payer: Medicare HMO | Admitting: Internal Medicine

## 2014-12-28 ENCOUNTER — Encounter: Payer: Self-pay | Admitting: Internal Medicine

## 2014-12-28 VITALS — BP 116/60 | HR 64 | Temp 97.3°F | Resp 16 | Ht 63.5 in | Wt 159.2 lb

## 2014-12-28 DIAGNOSIS — M150 Primary generalized (osteo)arthritis: Secondary | ICD-10-CM

## 2014-12-28 DIAGNOSIS — E559 Vitamin D deficiency, unspecified: Secondary | ICD-10-CM

## 2014-12-28 DIAGNOSIS — M159 Polyosteoarthritis, unspecified: Secondary | ICD-10-CM

## 2014-12-28 DIAGNOSIS — I6789 Other cerebrovascular disease: Secondary | ICD-10-CM

## 2014-12-28 DIAGNOSIS — I2510 Atherosclerotic heart disease of native coronary artery without angina pectoris: Secondary | ICD-10-CM

## 2014-12-28 DIAGNOSIS — E1121 Type 2 diabetes mellitus with diabetic nephropathy: Secondary | ICD-10-CM

## 2014-12-28 DIAGNOSIS — I10 Essential (primary) hypertension: Secondary | ICD-10-CM

## 2014-12-28 DIAGNOSIS — E782 Mixed hyperlipidemia: Secondary | ICD-10-CM

## 2014-12-28 DIAGNOSIS — C50312 Malignant neoplasm of lower-inner quadrant of left female breast: Secondary | ICD-10-CM

## 2014-12-28 DIAGNOSIS — Z79899 Other long term (current) drug therapy: Secondary | ICD-10-CM

## 2014-12-28 DIAGNOSIS — H548 Legal blindness, as defined in USA: Secondary | ICD-10-CM

## 2014-12-28 LAB — BASIC METABOLIC PANEL WITH GFR
BUN: 15 mg/dL (ref 6–23)
CALCIUM: 10.2 mg/dL (ref 8.4–10.5)
CHLORIDE: 105 meq/L (ref 96–112)
CO2: 25 meq/L (ref 19–32)
Creat: 1.03 mg/dL (ref 0.50–1.10)
GFR, EST NON AFRICAN AMERICAN: 54 mL/min — AB
GFR, Est African American: 62 mL/min
Glucose, Bld: 153 mg/dL — ABNORMAL HIGH (ref 70–99)
Potassium: 4.3 mEq/L (ref 3.5–5.3)
Sodium: 139 mEq/L (ref 135–145)

## 2014-12-28 LAB — LIPID PANEL
Cholesterol: 130 mg/dL (ref 0–200)
HDL: 27 mg/dL — AB (ref >=46)
LDL Cholesterol: 64 mg/dL (ref 0–99)
Total CHOL/HDL Ratio: 4.8 Ratio
Triglycerides: 197 mg/dL — ABNORMAL HIGH (ref <150)
VLDL: 39 mg/dL (ref 0–40)

## 2014-12-28 LAB — HEPATIC FUNCTION PANEL
ALK PHOS: 74 U/L (ref 39–117)
ALT: 53 U/L — AB (ref 0–35)
AST: 38 U/L — ABNORMAL HIGH (ref 0–37)
Albumin: 4.1 g/dL (ref 3.5–5.2)
BILIRUBIN INDIRECT: 0.4 mg/dL (ref 0.2–1.2)
Bilirubin, Direct: 0.1 mg/dL (ref 0.0–0.3)
TOTAL PROTEIN: 7.1 g/dL (ref 6.0–8.3)
Total Bilirubin: 0.5 mg/dL (ref 0.2–1.2)

## 2014-12-28 LAB — TSH: TSH: 3.411 u[IU]/mL (ref 0.350–4.500)

## 2014-12-28 LAB — MAGNESIUM: Magnesium: 1.9 mg/dL (ref 1.5–2.5)

## 2014-12-28 NOTE — Patient Instructions (Signed)

## 2014-12-28 NOTE — Progress Notes (Signed)
Patient ID: [name], female   DOB: [birth year], 74 y.o.   MRN: [id number]   This very nice 74 y.o. 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   Patient is treated for HTN & BP has been controlled at home. Today's BP: 116/60 mmHg. Patient has had no complaints of any cardiac type chest pain, palpitations, dyspnea/orthopnea/PND, dizziness, claudication, or dependent edema.   Hyperlipidemia is controlled with diet & meds. Patient denies myalgias or other med SE's. Last Lipids were  At goal - Chol 181; HDL 41; LDL  92; but elevated Trig 241 on 09/07/2014   Also, the patient has history of T2_NIDDM since 2009 with A1c 5.9% , then A1c 6.8% in 2012 and 6.5% in 2013. Patient was started on Metformin in July 2014 after A1c's lingered betw 6.8-7.0%.  Patient has Stage 3 CKD attributed to both her HTN as well as her T2_DM.  She reports FBG's range betw 90-130 mg% range and she has had no symptoms of reactive hypoglycemia, diabetic polys, paresthesias or visual blurring.  Last A1c was  6.7% on 09/07/2014.   Further, the patient also has history of Vitamin D Deficiency of 12 in 2009 and supplements vitamin D without any suspected side-effects. Last vitamin D was 26 on 09/07/2014.  Medication Sig  . TYLENOL #3 300-30  Take 1-2 tablets by mouth every 4 (four) hours as needed.  Marland Kitchen albuterol  HFA inhaler Inhale 2 puffs into the lungs every 6 (six) hours as needed for wheezing or shortness of breath.  Marland Kitchen aspirin 81 MG tablet Take 81 mg by mouth daily.    Marland Kitchen VITAMIN  2000 UNITS TABS Take 6,000 Units by mouth daily.   . fenofibrate 134 MG capsule TAKE ONE CAPSULE BY MOUTH ONCE DAILY   . fish oil 1000 MG capsule Take 1 capsule by mouth 3 (three) times daily.   . Flaxseed OIL 1000 MG CAPS Take  1,000 mg by mouth 3 (three) times daily.   . fluticasone (FLONASE)  nasal spray Place 1-2 sprays into both nostrils daily as needed for allergies or rhinitis.   . furosemide (LASIX) 20 MG tablet Take 1 tablet (20 mg total) by mouth daily. (Patient taking differently: Take 20 mg by mouth daily as needed for fluid. )  . losartan (COZAAR) 100 MG tablet TAKE ONE TABLET BY MOUTH ONCE DAILY (Patient taking differently: TAKE '100MG'$  BY MOUTH ONCE DAILY)  . Magnesium 500 MG TABS Take 500 mg by mouth 2 (two) times daily.   . metFORMIN -XR 500 MG 24 hr tablet TAKE FOUR TABLETS BY MOUTH ONCE DAILY FOR BLOOD SUGAR.   Marland Kitchen SPIRIVA  inhalation  Place 18 mcg into inhaler and inhale daily as needed (for wheezing).    Allergies  Allergen Reactions  . Bisoprolol Other (See Comments)    Decreased Heart Rate  . Ace Inhibitors Cough  . Acyclovir And Related Rash and Other (See Comments)    Okay to take famvir  . Amoxil [Amoxicillin] Diarrhea  . Ciprofloxacin Diarrhea  . Crestor [Rosuvastatin] Other (See Comments)    Elevated LFT's and Myalgias  . Doxycycline Diarrhea  . Enalapril Cough  . Lipitor [Atorvastatin] Other (See Comments)    Elevated LFT's and Myalgias  . Metformin And Related Diarrhea  .  Prednisone Other (See Comments)    Intolerant to High Dose Prednisone  . Vicodin [Hydrocodone-Acetaminophen] Other (See Comments)    Increased Anxiety and Nervousness   PMHx:   Past Medical History  Diagnosis Date  . CVA (cerebral vascular accident) 44  . Tobacco abuse   . Allergic rhinitis   . Hypertension   . Hyperlipidemia   . Elevated hemoglobin A1c measurement   . COPD (chronic obstructive pulmonary disease)   . Lymphocytosis     right arm  . ASCVD (arteriosclerotic cardiovascular disease)   . CVA (cerebral vascular accident)   . Atherosclerosis of aorta 2014    via CT AB/Pelvis- no AAA  . Ovarian cyst   . SVD (spontaneous vaginal delivery)     x 2  . Shortness of breath     occasional  .  Diabetes mellitus without complication   . DJD (degenerative joint disease)     neck, lower back, feet, hands  . Breast cancer 1990    right  . 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 Cancer of lower-inner quadrant of female breast 10/26/2014  . Full dentures   . Impaired vision     macular degeneration  . Weakness of right side of body    Immunization History  Administered Date(s) Administered  . Hepatitis B, ped/adol 12/17/2013, 01/17/2014, 07/19/2014  . Influenza Split 05/17/2011, 04/21/2012  . Influenza Whole 04/23/2009, 04/13/2010  . Influenza, High Dose Seasonal PF 06/07/2014  . Influenza,inj,Quad PF,36+ Mos 04/06/2013  . PPD Test 06/07/2014  . Pneumococcal Conjugate-13 06/07/2014  . Pneumococcal Polysaccharide-23 05/17/2011  . Td 07/23/2007   Past Surgical History  Procedure Laterality Date  . Mastectomy  1990    Right, no adjuvant  . Cholecystectomy  2006  . Abdominal hysterectomy  1976  . Cholecystectomy N/A 2008    Dr. Rosana Hoes  . Excision of benign salivary tumor Left 1968  . Shoulder surgery Left 2007    Dr. Percell Miller  . Back surgery  2005    lower back  . Multiple tooth extractions    . Bladder tact  1985  . Eye surgery      bilateral cataract eye surgery - now low vision  . Laparoscopic bilateral salpingo oopherectomy Bilateral 11/30/2013    Procedure: LAPAROSCOPIC RIGHT SALPINGO OOPHORECTOMY with extensive lysis of adhesions;  Surgeon: Allena Katz, MD;  Location: [facility];  Service: Gynecology;  Laterality: Bilateral;  . Breast surgery  1990    rightmastectomy-axillary nodes  . Breast lumpectomy with radioactive seed localization Left 11/30/2014    Procedure: LEFT BREAST LUMPECTOMY WITH RADIOACTIVE SEED LOCALIZATION;  Surgeon: [name], MD;  Location: [facility];  Service: General;  Laterality: Left;   FHx:    Reviewed / unchanged  SHx:    Reviewed / unchanged  Systems Review:  Constitutional: Denies fever, chills, wt  changes, headaches, insomnia, fatigue, night sweats, change in appetite. Eyes: Denies redness, diplopia, discharge, itchy, watery eyes. Vision limited to hand motion , but is unable to count fingers or discriminate details . ENT: Denies discharge, congestion, post nasal drip, epistaxis, sore throat, earache, hearing loss, dental pain, tinnitus, vertigo, sinus pain, snoring.  CV: Denies chest pain, palpitations, irregular heartbeat, syncope, dyspnea, diaphoresis, orthopnea, PND, claudication or edema. Respiratory: denies cough, dyspnea, DOE, pleurisy, hoarseness, laryngitis, wheezing.  Gastrointestinal: Denies dysphagia, odynophagia, heartburn, reflux, water brash, abdominal pain or cramps, nausea, vomiting, bloating, diarrhea, constipation, hematemesis, melena, hematochezia  or hemorrhoids. Genitourinary:  Denies dysuria, frequency, urgency, nocturia, hesitancy, discharge, hematuria or flank pain. Musculoskeletal: Denies arthralgias, myalgias, stiffness, jt. swelling, pain, limping or strain/sprain.  Skin: Denies pruritus, rash, hives, warts, acne, eczema or change in skin lesion(s). Neuro: No weakness, tremor, incoordination, spasms, paresthesia or pain. Psychiatric: Denies confusion, memory loss or sensory loss. Endo: Denies change in weight, skin or hair change.  Heme/Lymph: No excessive bleeding, bruising or enlarged lymph nodes.  Physical Exam  BP 116/60   Pulse 64  Temp 97.3 F   Resp 16  Ht 5' 3.5"   Wt 159 lb 3.2 oz     BMI 27.76   Appears well nourished and in no distress. Eyes: PERRLA, EOMs, conjunctiva no swelling or erythema. Sinuses: No frontal/maxillary tenderness ENT/Mouth: EAC's clear, TM's nl w/o erythema, bulging. Nares clear w/o erythema, swelling, exudates. Oropharynx clear without erythema or exudates. Oral hygiene is good. Tongue normal, non obstructing. Hearing intact.  Neck: Supple. Thyroid nl. Car 2+/2+ without bruits, nodes or JVD. Chest: Respirations nl with BS  clear & equal w/o rales, rhonchi, wheezing or stridor.  Cor: Heart sounds normal w/ regular rate and rhythm without sig. murmurs, gallops, clicks, or rubs. Peripheral pulses normal and equal  without edema.  Abdomen: Soft & bowel sounds normal. Non-tender w/o guarding, rebound, hernias, masses, or organomegaly.  Lymphatics: Unremarkable.  Musculoskeletal: Full ROM all peripheral extremities, joint stability, 5/5 strength, and normal gait.  Skin: Warm, dry without exposed rashes, lesions or ecchymosis apparent.  Neuro: Cranial nerves intact, reflexes equal bilaterally. Sensory-motor testing grossly intact. Tendon reflexes grossly intact.  Pysch: Alert & oriented x 3.  Insight and judgement nl & appropriate. No ideations.  Assessment and Plan:  1. Essential hypertension  - TSH  - BASIC METABOLIC PANEL WITH GFR  2. Hyperlipidemia  - Lipid panel  3. Type 2 diabetes mellitus with diabetic nephropathy  - Hemoglobin A1c - Insulin, random  4. Vitamin D deficiency  - Vit D  25 hydroxy   5. ASCVD (arteriosclerotic cardiovascular disease)   6. 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 Primary osteoarthritis involving multiple joints   8. Cancer of lower-inner quadrant of left female breast   9. Medication management  - CBC with Differential/Platelet  - Hepatic function panel - Magnesium   Recommended regular exercise, BP monitoring, weight control, and discussed med and SE's. Recommended labs to assess and monitor clinical status. Further disposition pending results of labs. Over 30 minutes of exam, counseling, chart review was performed

## 2014-12-29 ENCOUNTER — Ambulatory Visit (HOSPITAL_BASED_OUTPATIENT_CLINIC_OR_DEPARTMENT_OTHER): Payer: Medicare HMO | Admitting: Oncology

## 2014-12-29 VITALS — BP 113/67 | HR 80 | Temp 97.9°F | Resp 18 | Ht 63.5 in | Wt 159.8 lb

## 2014-12-29 DIAGNOSIS — C50312 Malignant neoplasm of lower-inner quadrant of left female breast: Secondary | ICD-10-CM

## 2014-12-29 DIAGNOSIS — Z170 Estrogen receptor positive status [ER+]: Secondary | ICD-10-CM

## 2014-12-29 LAB — INSULIN, RANDOM: Insulin: 63.9 u[IU]/mL — ABNORMAL HIGH (ref 2.0–19.6)

## 2014-12-29 LAB — CBC WITH DIFFERENTIAL/PLATELET
Basophils Absolute: 0 10*3/uL (ref 0.0–0.1)
Basophils Relative: 0 % (ref 0–1)
EOS ABS: 0.2 10*3/uL (ref 0.0–0.7)
Eosinophils Relative: 2 % (ref 0–5)
HEMATOCRIT: 41.7 % (ref 36.0–46.0)
HEMOGLOBIN: 14.2 g/dL (ref 12.0–15.0)
LYMPHS ABS: 5.2 10*3/uL — AB (ref 0.7–4.0)
LYMPHS PCT: 56 % — AB (ref 12–46)
MCH: 32.8 pg (ref 26.0–34.0)
MCHC: 34.1 g/dL (ref 30.0–36.0)
MCV: 96.3 fL (ref 78.0–100.0)
MONO ABS: 0.6 10*3/uL (ref 0.1–1.0)
MONOS PCT: 6 % (ref 3–12)
MPV: 10.2 fL (ref 8.6–12.4)
Neutro Abs: 3.3 10*3/uL (ref 1.7–7.7)
Neutrophils Relative %: 36 % — ABNORMAL LOW (ref 43–77)
Platelets: 293 10*3/uL (ref 150–400)
RBC: 4.33 MIL/uL (ref 3.87–5.11)
RDW: 12.8 % (ref 11.5–15.5)
WBC: 9.2 10*3/uL (ref 4.0–10.5)

## 2014-12-29 LAB — HEMOGLOBIN A1C
HEMOGLOBIN A1C: 6.8 % — AB (ref <5.7)
MEAN PLASMA GLUCOSE: 148 mg/dL — AB (ref <117)

## 2014-12-29 LAB — VITAMIN D 25 HYDROXY (VIT D DEFICIENCY, FRACTURES): Vit D, 25-Hydroxy: 38 ng/mL (ref 30–100)

## 2014-12-29 NOTE — Addendum Note (Signed)
Addended by: Prentiss Bells on: 12/29/2014 06:55 PM   Modules accepted: Medications

## 2014-12-29 NOTE — Progress Notes (Signed)
[name]  [phone] [phone]     ID: [name] DOB: Aug 23, 1940  MR#: [id number]  RFF#:[id number]  Patient Care Team: [name], MD as PCP - General (Internal Medicine) Inda Castle, MD as Consulting Physician (Gastroenterology) Deneise Lever, MD as Consulting Physician (Pulmonary Disease) Marica Otter, OD (Optometry) [name], MD as Consulting Physician (General Surgery) Chauncey Cruel, MD as Consulting Physician (Oncology) Eppie Gibson, MD as Attending Physician (Radiation Oncology) Mauro Kaufmann, RN as Registered Nurse Rockwell Germany, RN as Registered Nurse PCP: Alesia Richards, MD GYN: Everlene Farrier MD OTHER MD: Keturah Barre MD  CHIEF COMPLAINT: Estrogen receptor positive breast cancer  CURRENT TREATMENT: Anastrozole   BREAST CANCER HISTORY: From the original intake note:  "[name]" has a history of right breast cancer, status post right modified radical mastectomy in 1990. The patient tells me the tumor was "pretty day", and that they took 13 lymph nodes out. She does not know if the cancer was in the lymph nodes. She received no adjuvant therapy. She also has a history of uterine cancer in her 69s.  More recently, she had routine screening mammography September 2015 which showed a possible mass in the left breast. Diagnostic left mammography and left ultrasonography 04/11/2014 at the left breast showed an area of focal asymmetry measuring 8 mm in the left breast which was not palpable and not seen by ultrasonography.  Six-month follow-up was recommended and this was performed 10/24/2014, this time with tomography. The breast density was category C. There was an irregular subcentimeter mass in the left breast 8:00 position with coarse calcifications. This was not palpable, but this time ultrasound showed a hypoechoic left breast mass at the area in question, measuring 0.5 cm.  Biopsy of the left breast mass  10/24/2014 showed ([id number]) an invasive ductal carcinoma, grade 1 or 2, estrogen receptor 100% positive, progesterone receptor 75% positive, both with strong staining intensity, with an MIB-1 of 11% and no HER-2 amplification, the signals ratio being 1.09 and the number per cell 1.80.  The patient's subsequent history is as detailed below  INTERVAL HISTORY: [name] returns today for follow-up of her breast cancer accompanied by her husband [name]. Since her last visit here she underwent left lumpectomy without sentinel lymph node sampling. The pathology ([id number]) confirmed an invasive ductal carcinoma, grade 1, measuring 0.51 cm. Repeat HER-2 was again negative, with a signals ratio 1.15 and the number per cell 2.30.  The patient's case was presented at the multidisciplinary breast cancer conference 12/28/2014. At that time it was again felt that if the patient could tolerate anti-estrogens she might be able to avoid radiation.  REVIEW OF SYSTEMS: [name] did well with the surgery, with minimal pain, no bleeding, no fever. She continues to be very short of breath. Her pulmonary problems are followed by Dr. Annamaria Boots. She is having severe pain in her right knee. This is relatively new. She is considering orthopedic evaluation. Otherwise a detailed review of systems today was stable  PAST MEDICAL HISTORY: Past Medical History  Diagnosis Date  . CVA (cerebral vascular accident) 62  . Tobacco abuse   . Allergic rhinitis   . Hypertension   . Hyperlipidemia   . Elevated hemoglobin A1c measurement   . COPD (chronic obstructive pulmonary disease)   . Lymphocytosis     right arm  . ASCVD (arteriosclerotic cardiovascular disease)   . CVA (cerebral vascular accident)   . Atherosclerosis of aorta 2014  via CT AB/Pelvis- no AAA  . Ovarian cyst   . SVD (spontaneous vaginal delivery)     x 2  . Shortness of breath     occasional  . Diabetes mellitus without complication   . DJD (degenerative joint  disease)     neck, lower back, feet, hands  . Breast cancer 1990    right  . 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 Cancer of lower-inner quadrant of female breast 10/26/2014  . Full dentures   . Impaired vision     macular degeneration  . Weakness of right side of body     PAST SURGICAL HISTORY: Past Surgical History  Procedure Laterality Date  . Mastectomy  1990    Right, no adjuvant  . Cholecystectomy  2006  . Abdominal hysterectomy  1976  . Cholecystectomy N/A 2008    Dr. Rosana Hoes  . Excision of benign salivary tumor Left 1968  . Shoulder surgery Left 2007    Dr. Percell Miller  . Back surgery  2005    lower back  . Multiple tooth extractions    . Bladder tact  1985  . Eye surgery      bilateral cataract eye surgery - now low vision  . Laparoscopic bilateral salpingo oopherectomy Bilateral 11/30/2013    Procedure: LAPAROSCOPIC RIGHT SALPINGO OOPHORECTOMY with extensive lysis of adhesions;  Surgeon: Allena Katz, MD;  Location: [facility];  Service: Gynecology;  Laterality: Bilateral;  . Breast surgery  1990    rightmastectomy-axillary nodes  . Breast lumpectomy with radioactive seed localization Left 11/30/2014    Procedure: LEFT BREAST LUMPECTOMY WITH RADIOACTIVE SEED LOCALIZATION;  Surgeon: [name], MD;  Location: [facility];  Service: General;  Laterality: Left;    FAMILY HISTORY Family History  Problem Relation Age of Onset  . Emphysema Brother   . Emphysema Brother   . Emphysema Sister   . Allergies Brother     all brothers and sisters  . Asthma Sister   . Asthma Sister   . Heart disease Mother   . Cancer Mother     breast  . Heart disease Sister   . Heart disease Brother   . Cancer Sister     breast  . Colon cancer Neg Hx    the patient's father died from bladder cancer the age of 17. The patient's mother died from heart disease at the age of 59. The patient had 5 brothers and 7 sisters. The patient's mother and 3 of her sisters  had breast cancer. There is no history of ovarian cancer in the family  GYNECOLOGIC HISTORY:  No LMP recorded. Patient is postmenopausal. Menarche age 24, first live birth age 64. She is GX P2. She did not recall exactly when she went through menopause, but she underwent hysterectomy and unilateral salpingo-oophorectomy remotely. She never took hormone replacement. She took oral contraceptives approximately for 3 years with no complications  SOCIAL HISTORY:  [name] is a homemaker. She is very limited in what she can do at present. Her husband Kim Saxon "[name]" Gray is retired from Biomedical engineer. Their daughter [name] lives in [place] and is a health caregiver. Son [name] lives in [place] and works in home improvement. The patient has 5 grandchildren and 6 great-grandchildren. She is a Psychologist, forensic    ADVANCED DIRECTIVES: Not in place   HEALTH MAINTENANCE: History  Substance Use Topics  . Smoking status: Current Every Day Smoker -- 1.00 packs/day for  55 years    Types: Cigarettes  . Smokeless tobacco: Current User     Comment: Electronic cig as well @ 0.95m of e cig only  . Alcohol Use: No     Colonoscopy: November 2015  PAP: Status post hysterectomy  Bone density: 06/26/2011, normal  Lipid panel:  Allergies  Allergen Reactions  . Bisoprolol Other (See Comments)    Decreased Heart Rate  . Ace Inhibitors Cough  . Acyclovir And Related Rash and Other (See Comments)    Okay to take famvir  . Amoxil [Amoxicillin] Diarrhea  . Ciprofloxacin Diarrhea  . Crestor [Rosuvastatin] Other (See Comments)    Elevated LFT's and Myalgias  . Doxycycline Diarrhea  . Enalapril Cough  . Lipitor [Atorvastatin] Other (See Comments)    Elevated LFT's and Myalgias  . Metformin And Related Diarrhea  . Prednisone Other (See Comments)    Intolerant to High Dose Prednisone  . Vicodin [Hydrocodone-Acetaminophen] Other (See Comments)    Increased Anxiety and Nervousness    Current Outpatient  Prescriptions  Medication Sig Dispense Refill  . acetaminophen-codeine (TYLENOL #3) 300-30 MG per tablet Take 1-2 tablets by mouth every 4 (four) hours as needed. 40 tablet 0  . albuterol (PROVENTIL HFA;VENTOLIN HFA) 108 (90 BASE) MCG/ACT inhaler Inhale 2 puffs into the lungs every 6 (six) hours as needed for wheezing or shortness of breath.    .Marland Kitchenaspirin 81 MG tablet Take 81 mg by mouth daily.      . Cholecalciferol (VITAMIN D3) 2000 UNITS TABS Take 6,000 Units by mouth daily.     . fenofibrate micronized (LOFIBRA) 134 MG capsule TAKE ONE CAPSULE BY MOUTH ONCE DAILY (Patient taking differently: TAKE 134 MG BY MOUTH ONCE DAILY) 90 capsule 0  . fish oil-omega-3 fatty acids 1000 MG capsule Take 1 capsule by mouth 3 (three) times daily.     . Flaxseed, Linseed, (FLAX SEED OIL) 1000 MG CAPS Take 1,000 mg by mouth 3 (three) times daily.     . fluticasone (FLONASE) 50 MCG/ACT nasal spray Place 1-2 sprays into both nostrils daily as needed for allergies or rhinitis.     . furosemide (LASIX) 20 MG tablet Take 1 tablet (20 mg total) by mouth daily. (Patient taking differently: Take 20 mg by mouth daily as needed for fluid. ) 30 tablet 3  . losartan (COZAAR) 100 MG tablet TAKE ONE TABLET BY MOUTH ONCE DAILY (Patient taking differently: TAKE 100MG BY MOUTH ONCE DAILY) 90 tablet 1  . Magnesium 500 MG TABS Take 500 mg by mouth 2 (two) times daily.     . metFORMIN (GLUCOPHAGE-XR) 500 MG 24 hr tablet TAKE FOUR TABLETS BY MOUTH ONCE DAILY FOR BLOOD SUGAR. (Patient taking differently: Take 5065mby mouth once daily) 360 tablet 1  . tiotropium (SPIRIVA) 18 MCG inhalation capsule Place 18 mcg into inhaler and inhale daily as needed (for wheezing).      No current facility-administered medications for this visit.    OBJECTIVE: Older white woman who appears stated age Fi46itals:   12/29/14 1547  BP: 113/67  Pulse: 80  Temp: 97.9 F (36.6 C)  Resp: 18     Body mass index is 27.86 kg/(m^2).    ECOG FS:2 -  Symptomatic, <50% confined to bed  Sclerae unicteric, pupils round and equal Oropharynx clear and moist-- no thrush or other lesions No cervical or supraclavicular adenopathy Lungs no rales or rhonchi Heart regular rate and rhythm Abd soft, nontender, positive bowel sounds MSK no focal spinal  tenderness, no upper extremity lymphedema Neuro: Right body weakness and wasting as previously noted, well oriented, appropriate affect Breasts: The right breast is status post remote mastectomy. There is no evidence of local recurrence. The right axilla is benign. The left breast is status post recent lumpectomy. The incision is healing very nicely, with no dehiscence, inflammation, or erythema. There is minimal induration subjacent to the scar. Left axilla is benign    LAB RESULTS:  CMP     Component Value Date/Time   NA 139 12/28/2014 1553   NA 138 11/02/2014 1204   K 4.3 12/28/2014 1553   K 4.1 11/02/2014 1204   CL 105 12/28/2014 1553   CO2 25 12/28/2014 1553   CO2 23 11/02/2014 1204   GLUCOSE 153* 12/28/2014 1553   GLUCOSE 214* 11/02/2014 1204   BUN 15 12/28/2014 1553   BUN 15.6 11/02/2014 1204   CREATININE 1.03 12/28/2014 1553   CREATININE 0.84 11/30/2014 0652   CREATININE 1.0 11/02/2014 1204   CALCIUM 10.2 12/28/2014 1553   CALCIUM 10.3 11/02/2014 1204   PROT 7.1 12/28/2014 1553   PROT 7.4 11/02/2014 1204   ALBUMIN 4.1 12/28/2014 1553   ALBUMIN 4.0 11/02/2014 1204   AST 38* 12/28/2014 1553   AST 23 11/02/2014 1204   ALT 53* 12/28/2014 1553   ALT 33 11/02/2014 1204   ALKPHOS 74 12/28/2014 1553   ALKPHOS 97 11/02/2014 1204   BILITOT 0.5 12/28/2014 1553   BILITOT 0.38 11/02/2014 1204   GFRNONAA 54* 12/28/2014 1553   GFRNONAA >60 11/30/2014 0652   GFRAA 62 12/28/2014 1553   GFRAA >60 11/30/2014 0652    INo results found for: SPEP, UPEP  Lab Results  Component Value Date   WBC 9.2 12/28/2014   NEUTROABS 3.3 12/28/2014   HGB 14.2 12/28/2014   HCT 41.7 12/28/2014    MCV 96.3 12/28/2014   PLT 293 12/28/2014      Chemistry      Component Value Date/Time   NA 139 12/28/2014 1553   NA 138 11/02/2014 1204   K 4.3 12/28/2014 1553   K 4.1 11/02/2014 1204   CL 105 12/28/2014 1553   CO2 25 12/28/2014 1553   CO2 23 11/02/2014 1204   BUN 15 12/28/2014 1553   BUN 15.6 11/02/2014 1204   CREATININE 1.03 12/28/2014 1553   CREATININE 0.84 11/30/2014 0652   CREATININE 1.0 11/02/2014 1204      Component Value Date/Time   CALCIUM 10.2 12/28/2014 1553   CALCIUM 10.3 11/02/2014 1204   ALKPHOS 74 12/28/2014 1553   ALKPHOS 97 11/02/2014 1204   AST 38* 12/28/2014 1553   AST 23 11/02/2014 1204   ALT 53* 12/28/2014 1553   ALT 33 11/02/2014 1204   BILITOT 0.5 12/28/2014 1553   BILITOT 0.38 11/02/2014 1204       No results found for: LABCA2  No components found for: LABCA125  No results for input(s): INR in the last 168 hours.  Urinalysis    Component Value Date/Time   COLORURINE YELLOW 07/05/2013 [place] 07/05/2013 1658   LABSPEC 1.015 07/05/2013 1658   PHURINE 7.5 07/05/2013 1658   GLUCOSEU 100* 07/05/2013 1658   HGBUR NEG 07/05/2013 1658   BILIRUBINUR NEG 07/05/2013 1658   KETONESUR NEG 07/05/2013 1658   PROTEINUR NEG 07/05/2013 1658   UROBILINOGEN 0.2 07/05/2013 1658   NITRITE NEG 07/05/2013 1658   LEUKOCYTESUR NEG 07/05/2013 1658    STUDIES: Mm Breast Surgical Specimen  11/30/2014   CLINICAL DATA:  Patient status post left breast lumpectomy.  EXAM: SPECIMEN RADIOGRAPH OF THE LEFT BREAST  COMPARISON:  Previous exam(s).  FINDINGS: Status post excision of the left breast. The radioactive seed and biopsy marker clip are present, completely intact, and were marked for pathology.  IMPRESSION: Specimen radiograph of the left breast.   Electronically Signed   By: Lovey Newcomer M.D.   On: 11/30/2014 09:56    ASSESSMENT: 74 y.o. [place] woman status post left breast biopsy 10/24/2014 for a clinical T1 80 N0 invasive ductal  carcinoma, grade 1 or 2, estrogen and progesterone receptor positive, HER-2 negative, with an MIB-1 of 11%  (1) status post left lumpectomy 11/30/2014 for apT1b pNX, invasive ductal carcinoma, grade 1, again HER-2 negative, with negative marginsstage IA   (2) anastrozole started 12/30/2014  (3) if the patient is unable to tolerate anastrozole, she will benefit from adjuvant radiation  (4) status post right modified radical mastectomy in 1994 what was at least a stage II breast cancer, with no adjuvant therapy given  (5) status post hysterectomy with unilateral salpingo-oophorectomy for endometrial cancer in the patient's 30s, no further data available  PLAN: I reviewed the final pathology with [name] and her husband. Though technically the tumor is pT1b, basically she should be treated I think like a pT1a, and in those cases anti-estrogens are optional. However, June is very keen on not receiving radiation. I think if she is not going to receive radiation she definitely should take anti-estrogens because they will reduce the risk of local recurrence and of course we will also reduce the risk of distant recurrence which is however very small to begin with.  We then discussed the possible toxicities, side effects and complications of anastrozole. I am placing the prescription in. She has an appointment with Dr. Isidore Moos in 2 weeks which she was planning to cancel, but I suggested she keep the appointment in case she develops problems from the anastrozole. In that case she might reconsider the radiation decision. Otherwise she probably could cancel that appointment and then just return to see me in 3 months.  If she does tolerated the anastrozole well, we will repeat a DEXA scan and address the question of bone density. The plan would be to continue anastrozole for 5 years with consideration of bisphosphonates if necessary  [name] has a good understanding of the overall plan. She agrees with it. She knows  the goal of treatment in her case is cure. She will call with any problems that may develop before her next visit here.  Chauncey Cruel, MD   12/29/2014 4:13 PM Medical Oncology and Hematology [facility] [address], [place] [address] Tel. [phone]    Fax. [phone]

## 2014-12-30 ENCOUNTER — Other Ambulatory Visit: Payer: Self-pay | Admitting: *Deleted

## 2014-12-30 ENCOUNTER — Telehealth: Payer: Self-pay | Admitting: Oncology

## 2014-12-30 MED ORDER — ANASTROZOLE 1 MG PO TABS
1.0000 mg | ORAL_TABLET | Freq: Every day | ORAL | Status: DC
Start: 2014-12-30 — End: 2015-03-31

## 2015-01-17 ENCOUNTER — Telehealth: Payer: Self-pay | Admitting: *Deleted

## 2015-01-17 NOTE — Telephone Encounter (Signed)
Santiago Glad office personnel in [employer] called stating " pt's husband said Dr [name] told [name] she didn't need to see Dr Isidore Moos "  Pt is scheduled for visit 6/29.  This RN reviewed MD's dictation and noted pt " is not keen on receiving radiation "  Antiestrogen was started with MD recommendation to go to visit.  This RN informed Santiago Glad of above.

## 2015-01-18 ENCOUNTER — Encounter: Payer: Self-pay | Admitting: Gastroenterology

## 2015-01-18 ENCOUNTER — Ambulatory Visit
Admission: RE | Admit: 2015-01-18 | Discharge: 2015-01-18 | Disposition: A | Discharge status: Home / Self Care | Payer: Medicare HMO | Source: Ambulatory Visit | Attending: Radiation Oncology | Admitting: Radiation Oncology

## 2015-01-18 ENCOUNTER — Ambulatory Visit: Payer: Medicare HMO

## 2015-02-23 ENCOUNTER — Ambulatory Visit: Payer: Medicare HMO | Admitting: Internal Medicine

## 2015-03-01 ENCOUNTER — Encounter: Payer: Self-pay | Admitting: Gastroenterology

## 2015-03-17 ENCOUNTER — Other Ambulatory Visit: Payer: Self-pay | Admitting: Adult Health

## 2015-03-17 ENCOUNTER — Telehealth: Payer: Self-pay | Admitting: Oncology

## 2015-03-17 DIAGNOSIS — C50312 Malignant neoplasm of lower-inner quadrant of left female breast: Secondary | ICD-10-CM

## 2015-03-17 NOTE — Telephone Encounter (Signed)
s.w. pt and advised on OCT appt....pt ok and aware °

## 2015-03-28 ENCOUNTER — Ambulatory Visit: Payer: Medicare HMO | Admitting: Oncology

## 2015-03-28 ENCOUNTER — Other Ambulatory Visit: Payer: Self-pay | Admitting: *Deleted

## 2015-03-29 ENCOUNTER — Telehealth: Payer: Self-pay | Admitting: Oncology

## 2015-03-29 NOTE — Telephone Encounter (Signed)
s.w. pt and advised on sept appt.Marland KitchenMarland KitchenMarland KitchenMarland Kitchenpt ok and aware

## 2015-03-31 ENCOUNTER — Ambulatory Visit (HOSPITAL_BASED_OUTPATIENT_CLINIC_OR_DEPARTMENT_OTHER): Payer: Medicare HMO | Admitting: Oncology

## 2015-03-31 ENCOUNTER — Telehealth: Payer: Self-pay | Admitting: Oncology

## 2015-03-31 VITALS — BP 123/69 | HR 74 | Temp 97.7°F | Resp 17 | Ht 63.5 in | Wt 156.2 lb

## 2015-03-31 DIAGNOSIS — Z170 Estrogen receptor positive status [ER+]: Secondary | ICD-10-CM

## 2015-03-31 DIAGNOSIS — Z79811 Long term (current) use of aromatase inhibitors: Secondary | ICD-10-CM

## 2015-03-31 DIAGNOSIS — C50312 Malignant neoplasm of lower-inner quadrant of left female breast: Secondary | ICD-10-CM

## 2015-03-31 DIAGNOSIS — Z853 Personal history of malignant neoplasm of breast: Secondary | ICD-10-CM

## 2015-03-31 MED ORDER — EXEMESTANE 25 MG PO TABS: 25.0000 mg | ORAL_TABLET | Freq: Every day | ORAL | Status: DC

## 2015-03-31 NOTE — Telephone Encounter (Signed)
Gave avs & appointments scheduled

## 2015-03-31 NOTE — Progress Notes (Signed)
[facility]  [phone] Fax:([phone]) [phone]     ID: [name] DOB: [birth year]  MR#: [id number]  IOE#:[id number]  Patient Care Team: [name], MD as PCP - General (Internal Medicine) Inda Castle, MD as Consulting Physician (Gastroenterology) Deneise Lever, MD as Consulting Physician (Pulmonary Disease) Marica Otter, OD (Optometry) [name], MD as Consulting Physician (General Surgery) Chauncey Cruel, MD as Consulting Physician (Oncology) Eppie Gibson, MD as Attending Physician (Radiation Oncology) Mauro Kaufmann, RN as Registered Nurse Rockwell Germany, RN as Registered Nurse PCP: Alesia Richards, MD GYN: Everlene Farrier MD OTHER MD: Keturah Barre MD  CHIEF COMPLAINT: Estrogen receptor positive breast cancer  CURRENT TREATMENT: Exemestane   BREAST CANCER HISTORY: From the original intake note:  "[name]" has a history of right breast cancer, status post right modified radical mastectomy in 1990. The patient tells me the tumor was "pretty day", and that they took 13 lymph nodes out. She does not know if the cancer was in the lymph nodes. She received no adjuvant therapy. She also has a history of uterine cancer in her 18s.  More recently, she had routine screening mammography September 2015 which showed a possible mass in the left breast. Diagnostic left mammography and left ultrasonography 04/11/2014 at the left breast showed an area of focal asymmetry measuring 8 mm in the left breast which was not palpable and not seen by ultrasonography.  Six-month follow-up was recommended and this was performed 10/24/2014, this time with tomography. The breast density was category C. There was an irregular subcentimeter mass in the left breast 8:00 position with coarse calcifications. This was not palpable, but this time ultrasound showed a hypoechoic left breast mass at the area in question, measuring 0.5 cm.  Biopsy of the left breast mass  10/24/2014 showed ([id number]) an invasive ductal carcinoma, grade 1 or 2, estrogen receptor 100% positive, progesterone receptor 75% positive, both with strong staining intensity, with an MIB-1 of 11% and no HER-2 amplification, the signals ratio being 1.09 and the number per cell 1.80.  The patient's subsequent history is as detailed below  INTERVAL HISTORY: [name] returns today for follow-up of her breast cancer accompanied by her husband [name]. She has been on anastrozole since her last visit with me, in June. She initially tolerated it well, but over the past couple of months she has had increasing pain in many joints including both shoulders, both hips, knees, and also her hands and elbows. She has also had significant issues with hot flashes, which wake her up at night. This does not happen every night however but perhaps twice a week. During the day hot flashes are perhaps 3 times a week. They tend to last 1 hour when they do occur. The patient was able to obtain exemestane at $10 a month.  REVIEW OF SYSTEMS: Aside from the problems noted above a detailed review of systems today was otherwise stable  PAST MEDICAL HISTORY: Past Medical History  Diagnosis Date  . CVA (cerebral vascular accident) 42  . Tobacco abuse   . Allergic rhinitis   . Hypertension   . Hyperlipidemia   . Elevated hemoglobin A1c measurement   . COPD (chronic obstructive pulmonary disease)   . Lymphocytosis     right arm  . ASCVD (arteriosclerotic cardiovascular disease)   . CVA (cerebral vascular accident)   . Atherosclerosis of aorta 2014    via CT AB/Pelvis- no AAA  . Ovarian cyst   . SVD (spontaneous  vaginal delivery)     x 2  . Shortness of breath     occasional  . Diabetes mellitus without complication   . DJD (degenerative joint disease)     neck, lower back, feet, hands  . Breast cancer 1990    right  . 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 Cancer of lower-inner  quadrant of female breast 10/26/2014  . Full dentures   . Impaired vision     macular degeneration  . Weakness of right side of body     PAST SURGICAL HISTORY: Past Surgical History  Procedure Laterality Date  . Mastectomy  1990    Right, no adjuvant  . Cholecystectomy  2006  . Abdominal hysterectomy  1976  . Cholecystectomy N/A 2008    Dr. Rosana Hoes  . Excision of benign salivary tumor Left 1968  . Shoulder surgery Left 2007    Dr. Percell Miller  . Back surgery  2005    lower back  . Multiple tooth extractions    . Bladder tact  1985  . Eye surgery      bilateral cataract eye surgery - now low vision  . Laparoscopic bilateral salpingo oopherectomy Bilateral 11/30/2013    Procedure: LAPAROSCOPIC RIGHT SALPINGO OOPHORECTOMY with extensive lysis of adhesions;  Surgeon: Allena Katz, MD;  Location: [facility];  Service: Gynecology;  Laterality: Bilateral;  . Breast surgery  1990    rightmastectomy-axillary nodes  . Breast lumpectomy with radioactive seed localization Left 11/30/2014    Procedure: LEFT BREAST LUMPECTOMY WITH RADIOACTIVE SEED LOCALIZATION;  Surgeon: [name], MD;  Location: [facility];  Service: General;  Laterality: Left;    FAMILY HISTORY Family History  Problem Relation Age of Onset  . Emphysema Brother   . Emphysema Brother   . Emphysema Sister   . Allergies Brother     all brothers and sisters  . Asthma Sister   . Asthma Sister   . Heart disease Mother   . Cancer Mother     breast  . Heart disease Sister   . Heart disease Brother   . Cancer Sister     breast  . Colon cancer Neg Hx    the patient's father died from bladder cancer the age of 49. The patient's mother died from heart disease at the age of 84. The patient had 5 brothers and 7 sisters. The patient's mother and 3 of her sisters had breast cancer. There is no history of ovarian cancer in the family  GYNECOLOGIC HISTORY:  No LMP recorded. Patient is postmenopausal. Menarche age 60, first live birth  age 10. She is GX P2. She did not recall exactly when she went through menopause, but she underwent hysterectomy and unilateral salpingo-oophorectomy remotely. She never took hormone replacement. 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 She is very limited in what she can do at present. Her husband Gwyndolyn Saxon "[name]" Cammarata is retired from Biomedical engineer. Their daughter Butch Penny lives in [place] and is a health caregiver. Son [name] lives in [place] and works in home improvement. The patient has 5 grandchildren and 6 great-grandchildren. She is a Psychologist, forensic    ADVANCED DIRECTIVES: Not in place   HEALTH MAINTENANCE: Social History  Substance Use Topics  . Smoking status: Current Every Day Smoker -- 1.00 packs/day for 55 years    Types: Cigarettes  . Smokeless tobacco: Current User  Comment: Electronic cig as well @ 0.$Remove'6mg'mUUuwbz$  of e cig only  . Alcohol Use: No     Colonoscopy: November 2015  PAP: Status post hysterectomy  Bone density: 06/26/2011, normal  Lipid panel:  Allergies  Allergen Reactions  . Bisoprolol Other (See Comments)    Decreased Heart Rate  . Ace Inhibitors Cough  . Acyclovir And Related Rash and Other (See Comments)    Okay to take famvir  . Amoxil [Amoxicillin] Diarrhea  . Ciprofloxacin Diarrhea  . Crestor [Rosuvastatin] Other (See Comments)    Elevated LFT's and Myalgias  . Doxycycline Diarrhea  . Enalapril Cough  . Lipitor [Atorvastatin] Other (See Comments)    Elevated LFT's and Myalgias  . Metformin And Related Diarrhea  . Prednisone Other (See Comments)    Intolerant to High Dose Prednisone  . Vicodin [Hydrocodone-Acetaminophen] Other (See Comments)    Increased Anxiety and Nervousness    Current Outpatient Prescriptions  Medication Sig Dispense Refill  . acetaminophen-codeine (TYLENOL #3) 300-30 MG per tablet Take 1-2 tablets by mouth every 4 (four) hours as needed. 40  tablet 0  . albuterol (PROVENTIL HFA;VENTOLIN HFA) 108 (90 BASE) MCG/ACT inhaler Inhale 2 puffs into the lungs every 6 (six) hours as needed for wheezing or shortness of breath.    . anastrozole (ARIMIDEX) 1 MG tablet Take 1 tablet (1 mg total) by mouth daily. 30 tablet 11  . aspirin 81 MG tablet Take 81 mg by mouth daily.      . Cholecalciferol (VITAMIN D3) 2000 UNITS TABS Take 6,000 Units by mouth daily.     . fenofibrate micronized (LOFIBRA) 134 MG capsule TAKE ONE CAPSULE BY MOUTH ONCE DAILY (Patient taking differently: TAKE 134 MG BY MOUTH ONCE DAILY) 90 capsule 0  . fish oil-omega-3 fatty acids 1000 MG capsule Take 1 capsule by mouth 3 (three) times daily.     . Flaxseed, Linseed, (FLAX SEED OIL) 1000 MG CAPS Take 1,000 mg by mouth 3 (three) times daily.     . fluticasone (FLONASE) 50 MCG/ACT nasal spray Place 1-2 sprays into both nostrils daily as needed for allergies or rhinitis.     . furosemide (LASIX) 20 MG tablet Take 1 tablet (20 mg total) by mouth daily. (Patient taking differently: Take 20 mg by mouth daily as needed for fluid. ) 30 tablet 3  . losartan (COZAAR) 100 MG tablet TAKE ONE TABLET BY MOUTH ONCE DAILY (Patient taking differently: TAKE $RemoveBeforeDEI'100MG'MOwIkNoxICnDSvdV$  BY MOUTH ONCE DAILY) 90 tablet 1  . Magnesium 500 MG TABS Take 500 mg by mouth 2 (two) times daily.     . metFORMIN (GLUCOPHAGE-XR) 500 MG 24 hr tablet TAKE FOUR TABLETS BY MOUTH ONCE DAILY FOR BLOOD SUGAR. (Patient taking differently: Take $RemoveBeforeDEI'500mg'TGqdHlSKCJkeERLe$  by mouth once daily) 360 tablet 1  . tiotropium (SPIRIVA) 18 MCG inhalation capsule Place 18 mcg into inhaler and inhale daily as needed (for wheezing).      No current facility-administered medications for this visit.    OBJECTIVE: Older white woman in no acute distress Filed Vitals:   03/31/15 1149  BP: 123/69  Pulse: 74  Temp: 97.7 F (36.5 C)  Resp: 17     Body mass index is 27.23 kg/(m^2).    ECOG FS:2 - Symptomatic, <50% confined to bed  Sclerae unicteric, EOMs intact Oropharynx  clear, slightly dry No cervical or supraclavicular adenopathy Lungs no rales or rhonchi Heart regular rate and rhythm Abd soft, nontender, positive bowel sounds MSK no focal spinal tenderness, no left or right  upper extremity lymphedema Neuro: Status post right body CVA; clear speech; well oriented Breasts: Deferred  LAB RESULTS:  CMP     Component Value Date/Time   NA 139 12/28/2014 1553   NA 138 11/02/2014 1204   K 4.3 12/28/2014 1553   K 4.1 11/02/2014 1204   CL 105 12/28/2014 1553   CO2 25 12/28/2014 1553   CO2 23 11/02/2014 1204   GLUCOSE 153* 12/28/2014 1553   GLUCOSE 214* 11/02/2014 1204   BUN 15 12/28/2014 1553   BUN 15.6 11/02/2014 1204   CREATININE 1.03 12/28/2014 1553   CREATININE 0.84 11/30/2014 0652   CREATININE 1.0 11/02/2014 1204   CALCIUM 10.2 12/28/2014 1553   CALCIUM 10.3 11/02/2014 1204   PROT 7.1 12/28/2014 1553   PROT 7.4 11/02/2014 1204   ALBUMIN 4.1 12/28/2014 1553   ALBUMIN 4.0 11/02/2014 1204   AST 38* 12/28/2014 1553   AST 23 11/02/2014 1204   ALT 53* 12/28/2014 1553   ALT 33 11/02/2014 1204   ALKPHOS 74 12/28/2014 1553   ALKPHOS 97 11/02/2014 1204   BILITOT 0.5 12/28/2014 1553   BILITOT 0.38 11/02/2014 1204   GFRNONAA 54* 12/28/2014 1553   GFRNONAA >60 11/30/2014 0652   GFRAA 62 12/28/2014 1553   GFRAA >60 11/30/2014 0652    INo results found for: SPEP, UPEP  Lab Results  Component Value Date   WBC 9.2 12/28/2014   NEUTROABS 3.3 12/28/2014   HGB 14.2 12/28/2014   HCT 41.7 12/28/2014   MCV 96.3 12/28/2014   PLT 293 12/28/2014      Chemistry      Component Value Date/Time   NA 139 12/28/2014 1553   NA 138 11/02/2014 1204   K 4.3 12/28/2014 1553   K 4.1 11/02/2014 1204   CL 105 12/28/2014 1553   CO2 25 12/28/2014 1553   CO2 23 11/02/2014 1204   BUN 15 12/28/2014 1553   BUN 15.6 11/02/2014 1204   CREATININE 1.03 12/28/2014 1553   CREATININE 0.84 11/30/2014 0652   CREATININE 1.0 11/02/2014 1204      Component Value  Date/Time   CALCIUM 10.2 12/28/2014 1553   CALCIUM 10.3 11/02/2014 1204   ALKPHOS 74 12/28/2014 1553   ALKPHOS 97 11/02/2014 1204   AST 38* 12/28/2014 1553   AST 23 11/02/2014 1204   ALT 53* 12/28/2014 1553   ALT 33 11/02/2014 1204   BILITOT 0.5 12/28/2014 1553   BILITOT 0.38 11/02/2014 1204       No results found for: LABCA2  No components found for: LABCA125  No results for input(s): INR in the last 168 hours.  Urinalysis    Component Value Date/Time   COLORURINE YELLOW 07/05/2013 [facility] 07/05/2013 1658   LABSPEC 1.015 07/05/2013 1658   PHURINE 7.5 07/05/2013 1658   GLUCOSEU 100* 07/05/2013 1658   HGBUR NEG 07/05/2013 1658   BILIRUBINUR NEG 07/05/2013 1658   KETONESUR NEG 07/05/2013 1658   PROTEINUR NEG 07/05/2013 1658   UROBILINOGEN 0.2 07/05/2013 1658   NITRITE NEG 07/05/2013 1658   LEUKOCYTESUR NEG 07/05/2013 1658    STUDIES: No results found.  ASSESSMENT: 74 y.o. [place] woman status post left breast biopsy 10/24/2014 for a clinical T1 80 N0 invasive ductal carcinoma, grade 1 or 2, estrogen and progesterone receptor positive, HER-2 negative, with an MIB-1 of 11%  (1) status post left lumpectomy 11/30/2014 for apT1b pNX, invasive ductal carcinoma, grade 1, again HER-2 negative, with negative marginsstage IA   (2) anastrozole started 12/30/2014--  discontinued 03/31/2015 because of side effects  (a) exemestane started 03/31/2015  (3) if the patient is unable to tolerate aromatase inhibitors, she still refuses radiation  (4) status post right modified radical mastectomy in 1994 what was at least a stage II breast cancer, with no adjuvant therapy given  (5) status post hysterectomy with unilateral salpingo-oophorectomy for endometrial cancer in the patient's 30s, no further data available  PLAN: Quite aside from hot flashes and vaginal dryness issues, which we could deal with, Kimberley is having significant arthralgias and myalgias which I am  sure are related to her anastrozole. She is stopping that medication but we are not starting the next one until October 1. That will give her about 3 or 4 weeks for the current medication to "washout" of her system, and if she 5*is no improvement at all in her symptoms and perhaps we can give anastrozole a second try.  More likely we will start exemestane then. The side effects could be the same, but many of my patients who cannot tolerate anastrozole can move to exemestane and do well with that. The issue is cost. Many insurance companies do not have exemestane on their formulary. The patient will let me know if cost becomes prohibitive for her.  Otherwise she will return see me in 3 months. She knows to call for any problems that may develop before that visit. Chauncey Cruel, MD   03/31/2015 12:19 PM Medical Oncology and Hematology [facility] [address] [place], [place] [address] Tel. [phone]    Fax. [phone]

## 2015-04-03 ENCOUNTER — Telehealth: Payer: Self-pay | Admitting: *Deleted

## 2015-04-03 DIAGNOSIS — C50312 Malignant neoplasm of lower-inner quadrant of left female breast: Secondary | ICD-10-CM

## 2015-04-03 NOTE — Telephone Encounter (Signed)
Called pt for 3 mon f/u. Relate doing well and without complaints. Spoke to pt concerning survivorship program and speaking with Chestine Spore NP. Denies needs at this time. Encourage pt to call with questions or concerns. Received verbal understanding. Contact information given.

## 2015-04-04 ENCOUNTER — Telehealth: Payer: Self-pay | Admitting: Nurse Practitioner

## 2015-04-04 ENCOUNTER — Telehealth: Payer: Self-pay | Admitting: *Deleted

## 2015-04-04 NOTE — Telephone Encounter (Signed)
THE AROMASIN PRESCRIPTION IS $68.00 A MONTH OR $162.00 FOR THREE MONTHS. PT. IS UNABLE TO AFFORD THIS MEDICATION.

## 2015-04-04 NOTE — Telephone Encounter (Signed)
Called to discuss upcoming appointment in Survivorship clinic for review of survivorship care plan.  It is hard for patient to come back and forth to cancer center at this time.  Therefore, I have offered to mail patient a copy of her survivorship care plan and answer any questions she has via telephone.  She is in agreement.  I will cancel her October appointment with me.  She will keep her appointments for lab and office visit with Dr. [name] in December 2016.

## 2015-04-04 NOTE — Telephone Encounter (Signed)
[name], is there anything we can do to help this pt w her drug?

## 2015-04-05 ENCOUNTER — Other Ambulatory Visit: Payer: Self-pay | Admitting: *Deleted

## 2015-04-05 ENCOUNTER — Telehealth: Payer: Self-pay | Admitting: *Deleted

## 2015-04-05 ENCOUNTER — Other Ambulatory Visit: Payer: Self-pay | Admitting: Oncology

## 2015-04-05 ENCOUNTER — Ambulatory Visit (INDEPENDENT_AMBULATORY_CARE_PROVIDER_SITE_OTHER): Payer: Medicare HMO | Admitting: Internal Medicine

## 2015-04-05 VITALS — BP 142/64 | HR 66 | Temp 98.2°F | Resp 18 | Ht 63.5 in | Wt 158.0 lb

## 2015-04-05 DIAGNOSIS — Z79899 Other long term (current) drug therapy: Secondary | ICD-10-CM | POA: Diagnosis not present

## 2015-04-05 DIAGNOSIS — J329 Chronic sinusitis, unspecified: Secondary | ICD-10-CM

## 2015-04-05 DIAGNOSIS — E782 Mixed hyperlipidemia: Secondary | ICD-10-CM | POA: Diagnosis not present

## 2015-04-05 DIAGNOSIS — E559 Vitamin D deficiency, unspecified: Secondary | ICD-10-CM

## 2015-04-05 DIAGNOSIS — Z23 Encounter for immunization: Secondary | ICD-10-CM

## 2015-04-05 DIAGNOSIS — I10 Essential (primary) hypertension: Secondary | ICD-10-CM | POA: Diagnosis not present

## 2015-04-05 DIAGNOSIS — E1121 Type 2 diabetes mellitus with diabetic nephropathy: Secondary | ICD-10-CM

## 2015-04-05 MED ORDER — DEXAMETHASONE SODIUM PHOSPHATE 100 MG/10ML IJ SOLN
10.0000 mg | Freq: Once | INTRAMUSCULAR | Status: AC
  Administered 2015-04-05: 10 mg via INTRAMUSCULAR

## 2015-04-05 MED ORDER — FLUTICASONE PROPIONATE 50 MCG/ACT NA SUSP: 2.0000 | Freq: Every day | NASAL | Status: DC

## 2015-04-05 MED ORDER — EXEMESTANE 25 MG PO TABS: 25.0000 mg | ORAL_TABLET | Freq: Every day | ORAL | Status: DC

## 2015-04-05 MED ORDER — AZITHROMYCIN 250 MG PO TABS: ORAL_TABLET | ORAL | Status: DC

## 2015-04-05 NOTE — Progress Notes (Signed)
Patient ID: [name], female   DOB: 08/12/1940, 74 y.o.   MRN: [id number]  Assessment and Plan:  Hypertension:  -Continue medication -monitor blood pressure at home. -Continue DASH diet -Reminder to go to the ER if any CP, SOB, nausea, dizziness, severe HA, changes vision/speech, left arm numbness and tingling and jaw pain.  Cholesterol - Continue diet and exercise -Check cholesterol.   Diabetes with diabetic chronic kidney disease -Continue diet and exercise.  -Check A1C  Vitamin D Def -check level -continue medications.   Joint Pain -decadron  Sinusitis -daily zyrtec -decadron -flonase refilled/ has been out of it  Continue diet and meds as discussed. Further disposition pending results of labs. Discussed med's effects and SE's.    HPI 74 y.o. female  presents for 3 month follow up with hypertension, hyperlipidemia, diabetes and vitamin D deficiency.   Her blood pressure has been controlled at home, today their BP is BP: (!) 142/64 mmHg.She does not workout. She denies chest pain, shortness of breath, dizziness.   She is on cholesterol medication and denies myalgias. Her cholesterol is at goal. The cholesterol was:  12/28/2014: Cholesterol 130; HDL 27*; LDL Cholesterol 64; Triglycerides 197*   She has been working on diet and exercise for diabetes with diabetic chronic kidney disease, she is on bASA, she is on ACE/ARB, and denies  foot ulcerations, hyperglycemia, hypoglycemia , increased appetite, nausea, paresthesia of the feet, polydipsia, polyuria, vomiting and weight loss. Last A1C was: 12/28/2014: Hgb A1c MFr Bld 6.8*   Patient is on Vitamin D supplement. 12/28/2014: Vit D, 25-Hydroxy 38  Patient reports that she is having some runny nose and sinus pressure and congestion.  She reports that this has been going on x 1 week.  No meds at home.  She is a current every day smoker.  She is also having cough, wheeze, and shortness of breath.  She has been using albuterol  inhaler twice daily.    Current Medications:  Current Outpatient Prescriptions on File Prior to Visit  Medication Sig Dispense Refill  . acetaminophen-codeine (TYLENOL #3) 300-30 MG per tablet Take 1-2 tablets by mouth every 4 (four) hours as needed. 40 tablet 0  . albuterol (PROVENTIL HFA;VENTOLIN HFA) 108 (90 BASE) MCG/ACT inhaler Inhale 2 puffs into the lungs every 6 (six) hours as needed for wheezing or shortness of breath.    Marland Kitchen aspirin 81 MG tablet Take 81 mg by mouth daily.      . Cholecalciferol (VITAMIN D3) 2000 UNITS TABS Take 6,000 Units by mouth daily.     . fenofibrate micronized (LOFIBRA) 134 MG capsule TAKE ONE CAPSULE BY MOUTH ONCE DAILY (Patient taking differently: TAKE 134 MG BY MOUTH ONCE DAILY) 90 capsule 0  . fish oil-omega-3 fatty acids 1000 MG capsule Take 1 capsule by mouth 3 (three) times daily.     . Flaxseed, Linseed, (FLAX SEED OIL) 1000 MG CAPS Take 1,000 mg by mouth 3 (three) times daily.     . fluticasone (FLONASE) 50 MCG/ACT nasal spray Place 1-2 sprays into both nostrils daily as needed for allergies or rhinitis.     . furosemide (LASIX) 20 MG tablet Take 1 tablet (20 mg total) by mouth daily. (Patient taking differently: Take 20 mg by mouth daily as needed for fluid. ) 30 tablet 3  . losartan (COZAAR) 100 MG tablet TAKE ONE TABLET BY MOUTH ONCE DAILY (Patient taking differently: TAKE '100MG'$  BY MOUTH ONCE DAILY) 90 tablet 1  . Magnesium 500 MG TABS Take  500 mg by mouth 2 (two) times daily.     . metFORMIN (GLUCOPHAGE-XR) 500 MG 24 hr tablet TAKE FOUR TABLETS BY MOUTH ONCE DAILY FOR BLOOD SUGAR. (Patient taking differently: Take '500mg'$  by mouth once daily) 360 tablet 1  . tiotropium (SPIRIVA) 18 MCG inhalation capsule Place 18 mcg into inhaler and inhale daily as needed (for wheezing).      No current facility-administered medications on file prior to visit.   Medical History:  Past Medical History  Diagnosis Date  . CVA (cerebral vascular accident) 64  .  Tobacco abuse   . Allergic rhinitis   . Hypertension   . Hyperlipidemia   . Elevated hemoglobin A1c measurement   . COPD (chronic obstructive pulmonary disease)   . Lymphocytosis     right arm  . ASCVD (arteriosclerotic cardiovascular disease)   . CVA (cerebral vascular accident)   . Atherosclerosis of aorta 2014    via CT AB/Pelvis- no AAA  . Ovarian cyst   . SVD (spontaneous vaginal delivery)     x 2  . Shortness of breath     occasional  . Diabetes mellitus without complication   . DJD (degenerative joint disease)     neck, lower back, feet, hands  . Breast cancer 1990    right  . 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 Cancer of lower-inner quadrant of female breast 10/26/2014  . Full dentures   . Impaired vision     macular degeneration  . Weakness of right side of body    Allergies:  Allergies  Allergen Reactions  . Bisoprolol Other (See Comments)    Decreased Heart Rate  . Ace Inhibitors Cough  . Acyclovir And Related Rash and Other (See Comments)    Okay to take famvir  . Amoxil [Amoxicillin] Diarrhea  . Ciprofloxacin Diarrhea  . Crestor [Rosuvastatin] Other (See Comments)    Elevated LFT's and Myalgias  . Doxycycline Diarrhea  . Enalapril Cough  . Lipitor [Atorvastatin] Other (See Comments)    Elevated LFT's and Myalgias  . Metformin And Related Diarrhea  . Prednisone Other (See Comments)    Intolerant to High Dose Prednisone  . Vicodin [Hydrocodone-Acetaminophen] Other (See Comments)    Increased Anxiety and Nervousness     Review of Systems:  ROS  Family history- Review and unchanged  Social history- Review and unchanged  Physical Exam: BP 142/64 mmHg  Pulse 66  Temp(Src) 98.2 F (36.8 C) (Temporal)  Resp 18  Ht 5' 3.5" (1.613 m)  Wt 158 lb (71.668 kg)  BMI 27.55 kg/m2 Wt Readings from Last 3 Encounters:  04/05/15 158 lb (71.668 kg)  03/31/15 156 lb 3.2 oz (70.852 kg)  12/29/14 159 lb 12.8 oz (72.485 kg)    General Appearance: Well nourished well developed, non-toxic appearing, in no apparent distress. Eyes: PERRLA, EOMs, conjunctiva no swelling or erythema ENT/Mouth: Ear canals clear with no erythema, swelling, or discharge.  TMs normal bilaterally, oropharynx clear, moist, with no exudate.   Neck: Supple, thyroid normal, no JVD, no cervical adenopathy.  Respiratory: Respiratory effort normal, breath sounds clear A&P, no wheeze, rhonchi or rales noted.  No retractions, no accessory muscle usage Cardio: RRR with no MRGs. No noted edema.  Abdomen: Soft, + BS.  Non tender, no guarding, rebound, hernias, masses. Musculoskeletal: Full ROM, 5/5 strength, Normal gait. Right leg with neurogenic atrophy secondary to earlier stroke.   Skin: Warm, dry without rashes, lesions, ecchymosis.  Neuro: Awake and oriented X 3, Cranial nerves intact. No cerebellar symptoms.  Psych: normal affect, Insight and Judgment appropriate.    [name], PA-C 3:36 PM [facility]

## 2015-04-05 NOTE — Addendum Note (Signed)
Addended by: Chai Routh A on: 04/05/2015 04:04 PM   Modules accepted: Orders

## 2015-04-05 NOTE — Telephone Encounter (Signed)
-----   Message from Mariam Dollar sent at 04/05/2015  8:51 AM EDT ----- Regarding: FW: Aromasin assistance Hey Val, I need a script for Aromasin please. I need to see if any asst is available. Thanks, Raquel ----- Message -----    From: Shirlean Mylar, [place]    Sent: 04/04/2015   3:03 PM      To: Gaspar Bidding, Zaylister Andris Baumann Subject: Aromasin assistance                            Hey Ladies,   Dr. [name] has a patient [name] (MRN: [id number]) trying to start on Aromasin (exemestane) 25 mg daily.   I believe she has [employer].   Monthly cost for her is $68/month or $162 for 3 months.   Is there any type of assistance you are aware of?   I am not sure if patient if patient will qualify for Pf[employer] saving card as available at this site due to her medicare:  [email or web address]   Thank you for the help

## 2015-04-05 NOTE — Telephone Encounter (Signed)
Obtained script and placed in managed care folder.  This RN did contact pt to inform her this office is processing for assistance with co pay for aromasin.

## 2015-04-06 ENCOUNTER — Encounter: Payer: Self-pay | Admitting: Nurse Practitioner

## 2015-04-06 ENCOUNTER — Telehealth: Payer: Self-pay | Admitting: Nurse Practitioner

## 2015-04-06 LAB — CBC WITH DIFFERENTIAL/PLATELET
Basophils Absolute: 0 K/uL (ref 0.0–0.1)
Basophils Relative: 0 % (ref 0–1)
Eosinophils Absolute: 0.3 K/uL (ref 0.0–0.7)
Eosinophils Relative: 3 % (ref 0–5)
HCT: 40.4 % (ref 36.0–46.0)
Hemoglobin: 14.2 g/dL (ref 12.0–15.0)
Lymphocytes Relative: 59 % — ABNORMAL HIGH (ref 12–46)
Lymphs Abs: 5.2 K/uL — ABNORMAL HIGH (ref 0.7–4.0)
MCH: 32.6 pg (ref 26.0–34.0)
MCHC: 35.1 g/dL (ref 30.0–36.0)
MCV: 92.9 fL (ref 78.0–100.0)
MPV: 10.1 fL (ref 8.6–12.4)
Monocytes Absolute: 0.8 K/uL (ref 0.1–1.0)
Monocytes Relative: 9 % (ref 3–12)
Neutro Abs: 2.6 K/uL (ref 1.7–7.7)
Neutrophils Relative %: 29 % — ABNORMAL LOW (ref 43–77)
Platelets: 255 K/uL (ref 150–400)
RBC: 4.35 MIL/uL (ref 3.87–5.11)
RDW: 13.4 % (ref 11.5–15.5)
WBC: 8.8 K/uL (ref 4.0–10.5)

## 2015-04-06 LAB — HEPATIC FUNCTION PANEL
ALK PHOS: 81 U/L (ref 33–130)
ALT: 40 U/L — ABNORMAL HIGH (ref 6–29)
AST: 34 U/L (ref 10–35)
Albumin: 4.2 g/dL (ref 3.6–5.1)
BILIRUBIN INDIRECT: 0.3 mg/dL (ref 0.2–1.2)
BILIRUBIN TOTAL: 0.4 mg/dL (ref 0.2–1.2)
Bilirubin, Direct: 0.1 mg/dL (ref <=0.2)
Total Protein: 6.9 g/dL (ref 6.1–8.1)

## 2015-04-06 LAB — BASIC METABOLIC PANEL WITH GFR
BUN: 12 mg/dL (ref 7–25)
CHLORIDE: 102 mmol/L (ref 98–110)
CO2: 24 mmol/L (ref 20–31)
Calcium: 10.1 mg/dL (ref 8.6–10.4)
Creat: 0.88 mg/dL (ref 0.60–0.93)
GFR, EST AFRICAN AMERICAN: 75 mL/min (ref >=60)
GFR, EST NON AFRICAN AMERICAN: 65 mL/min (ref >=60)
GLUCOSE: 82 mg/dL (ref 65–99)
POTASSIUM: 4 mmol/L (ref 3.5–5.3)
Sodium: 138 mmol/L (ref 135–146)

## 2015-04-06 LAB — LIPID PANEL
Cholesterol: 171 mg/dL (ref 125–200)
HDL: 32 mg/dL — AB (ref >=46)
LDL CALC: 81 mg/dL (ref <130)
TRIGLYCERIDES: 292 mg/dL — AB (ref <150)
Total CHOL/HDL Ratio: 5.3 Ratio — ABNORMAL HIGH (ref <=5.0)
VLDL: 58 mg/dL — AB (ref <30)

## 2015-04-06 LAB — VITAMIN D 25 HYDROXY (VIT D DEFICIENCY, FRACTURES): Vit D, 25-Hydroxy: 30 ng/mL (ref 30–100)

## 2015-04-06 LAB — INSULIN, RANDOM: Insulin: 15.8 u[IU]/mL (ref 2.0–19.6)

## 2015-04-06 LAB — HEMOGLOBIN A1C
HEMOGLOBIN A1C: 6.8 % — AB (ref <5.7)
Mean Plasma Glucose: 148 mg/dL — ABNORMAL HIGH (ref <117)

## 2015-04-06 LAB — MAGNESIUM: Magnesium: 2.1 mg/dL (ref 1.5–2.5)

## 2015-04-06 LAB — TSH: TSH: 2.324 u[IU]/mL (ref 0.350–4.500)

## 2015-04-06 NOTE — Progress Notes (Signed)
The Survivorship Care Plan was mailed to [name] as she reported not being able to come in to the Survivorship Clinic for an in-person visit at this time. A letter was mailed to her outlining the purpose of the content of the care plan, as well as encouraging her to reach out to me with any questions or concerns.  My business card was included in the correspondence to the patient as well.  A copy of the care plan was also routed/faxed/mailed to Kim Richards, MD, the patient's PCP.  I will not be placing any follow-up appointments to the Survivorship Clinic for [name], but I am happy to see her at any time in the future for any survivorship concerns that may arise. Thank you for allowing me to participate in her care!  Kenn File, [place] [phone]

## 2015-04-06 NOTE — Telephone Encounter (Signed)
Called and spoke with patient.  I clarified need for labwork in October with Dr. [name], who says patient's next scheduled labwork should be in December (currently scheduled prior to visit with him on that same day).  I verified this with patient and patient's husband.  I am mailing her a copy of her survivorship care plan in lieu of an in-person visit at her request.  I asked that she call with any questions after reviewing the materials.

## 2015-04-07 NOTE — Telephone Encounter (Signed)
Request forwarded to managed care. Pt notified.

## 2015-04-11 ENCOUNTER — Encounter: Payer: Self-pay | Admitting: Oncology

## 2015-04-11 NOTE — Progress Notes (Signed)
I sent message to Zigmund Daniel and Val to get script for aromasin for possible asst thru [facility]

## 2015-04-19 ENCOUNTER — Other Ambulatory Visit: Payer: Self-pay | Admitting: *Deleted

## 2015-04-19 MED ORDER — ANASTROZOLE 1 MG PO TABS: 1.0000 mg | ORAL_TABLET | Freq: Every day | ORAL | Status: DC

## 2015-04-20 ENCOUNTER — Encounter: Payer: Self-pay | Admitting: Oncology

## 2015-04-20 NOTE — Progress Notes (Signed)
I faxed [facility] req for aromosin

## 2015-04-21 ENCOUNTER — Telehealth: Payer: Self-pay | Admitting: Nurse Practitioner

## 2015-04-21 NOTE — Telephone Encounter (Signed)
Received call from patient's husband regarding status of new aromatase inhibitor prescription.  [name] was to begin exemestane tomorrow (October 1,2016) after discontinuing anastrozole earlier in September due to excessive toxicity.  Her cost for the medication was too high and she called requesting assistance with it, but had not yet heard back on whether discounted medication or assistance would be able to be provided.  In reviewing documentation, it appears that a request for exemestane was faxed to [facility] yesterday (April 20, 2015) and we are waiting to hear back regarding the status.  I informed [name] (and his wife who was listening on the line) of the status and our hope to hear something soon.  I advised them that it was okay to not start therapy tomorrow, that we would work to get the medication in hand ASAP and a delay into next week was okay in terms of her treatment.  They had no additional questions and will await new information from [facility] once it is received.

## 2015-04-25 ENCOUNTER — Encounter: Payer: Self-pay | Admitting: Adult Health

## 2015-04-25 NOTE — Progress Notes (Signed)
A birthday card was mailed to the patient today on behalf of the Survivorship Program at [facility].   Gretchen Dawson, NP [facility] [phone]  

## 2015-04-26 ENCOUNTER — Telehealth: Payer: Self-pay | Admitting: Nurse Practitioner

## 2015-04-26 ENCOUNTER — Encounter: Payer: Self-pay | Admitting: Oncology

## 2015-04-26 NOTE — Progress Notes (Signed)
Per [facility] anastrozole was shipped via [facility]

## 2015-04-26 NOTE — Telephone Encounter (Signed)
Called and spoke with [name]. They received the anastrozole from [facility] and [name] will begin it tomorrow.  I asked her to report any side effects to Dr. [name] ASAP if they occur.

## 2015-04-26 NOTE — Telephone Encounter (Signed)
Called patient to provide update on status of medication.  Left message stating that medication has been mailed from [facility] and she should be receiving soon.  Requested return call to confirm that she received message.

## 2015-05-04 ENCOUNTER — Encounter: Payer: Self-pay | Admitting: Internal Medicine

## 2015-05-04 ENCOUNTER — Ambulatory Visit (INDEPENDENT_AMBULATORY_CARE_PROVIDER_SITE_OTHER): Payer: Medicare HMO | Admitting: Internal Medicine

## 2015-05-04 VITALS — BP 140/72 | HR 80 | Temp 98.4°F | Resp 18 | Ht 63.5 in | Wt 157.0 lb

## 2015-05-04 DIAGNOSIS — J441 Chronic obstructive pulmonary disease with (acute) exacerbation: Secondary | ICD-10-CM

## 2015-05-04 MED ORDER — AZITHROMYCIN 250 MG PO TABS: ORAL_TABLET | ORAL | Status: DC

## 2015-05-04 MED ORDER — DEXAMETHASONE SODIUM PHOSPHATE 100 MG/10ML IJ SOLN
10.0000 mg | Freq: Once | INTRAMUSCULAR | Status: AC
  Administered 2015-05-04: 10 mg via INTRAMUSCULAR

## 2015-05-04 MED ORDER — PROMETHAZINE-DM 6.25-15 MG/5ML PO SYRP: ORAL_SOLUTION | ORAL | Status: DC

## 2015-05-04 NOTE — Progress Notes (Signed)
Patient ID: [name], female   DOB: 20-Mar-1941, 74 y.o.   MRN: [id number]  HPI  Patient presents to the office for evaluation of cough.  It has been going on for 1 days.  Patient reports all the time, wet, cough with yellow minimal sputum production.  They also endorse change in voice, chills, postnasal drip, shortness of breath, sputum production, wheezing and sinus pressure, nasal congestion with yellow sputum..  They have tried no medications other than saline and chlorasedant.  They report that nothing has worked.  They denies other sick contacts.  She does use spiriva daily and then uses albuterol twice daily.  She reports that the chest tightness and shortness of breath goes away with inhaler.   Patient is a very long standing smoker and continues to smoke.  She also reports some chest tightness with coughing.    Review of Systems  Constitutional: Positive for chills and malaise/fatigue. Negative for fever.  HENT: Positive for congestion, ear pain and sore throat.   Respiratory: Positive for cough, sputum production, shortness of breath and wheezing.   Cardiovascular: Negative for chest pain, palpitations and leg swelling.  Neurological: Negative for headaches.    PE:  Filed Vitals:   05/04/15 1543  BP: 140/72  Pulse: 80  Temp: 98.4 F (36.9 C)  Resp: 18    General:  Alert and non-toxic, WDWN, NAD HEENT: NCAT, PERLA, EOM normal, no occular discharge or erythema.  Nasal mucosal edema with sinus tenderness to palpation.  Oropharynx clear with minimal oropharyngeal edema and erythema.  Mucous membranes moist and pink. Neck:  Cervical adenopathy Chest:  RRR no MRGs.  Lungs clear to auscultation A&P with rhonchi or rales, occasional end expiratory wheeze.   Abdomen: +BS x 4 quadrants, soft, non-tender, no guarding, rigidity, or rebound. Skin: warm and dry no rash Neuro: A&Ox4, CN II-XII grossly intact  Assessment and Plan:   1. COPD exacerbation  (HCC) -zpak -mucinex -phenergan dextromethorphan syrup -anoro sample x 7 days -cont allegra -nasal saline - dexamethasone (DECADRON) injection 10 mg; Inject 1 mL (10 mg total) into the muscle once.

## 2015-05-05 ENCOUNTER — Encounter: Payer: Medicare HMO | Admitting: Nurse Practitioner

## 2015-05-08 ENCOUNTER — Telehealth: Payer: Self-pay | Admitting: *Deleted

## 2015-05-08 ENCOUNTER — Other Ambulatory Visit: Payer: Self-pay | Admitting: Oncology

## 2015-05-08 MED ORDER — LETROZOLE 2.5 MG PO TABS: 2.5000 mg | ORAL_TABLET | Freq: Every day | ORAL | Status: DC

## 2015-05-08 NOTE — Telephone Encounter (Signed)
Per Dr. [name], have the patient stop Arimidex and start Femara. Prescription was e-scribed to her pharmacy. Patient verbalized understanding.

## 2015-05-09 ENCOUNTER — Telehealth: Payer: Self-pay | Admitting: Nurse Practitioner

## 2015-05-09 NOTE — Telephone Encounter (Signed)
Received call from Mr. Moomaw, husband of [name], about her aromatase inhibitor.  They had received a call yesterday from our office instructing her to stop the anastrozole and begin letrozole daily, with the prescription sent electronically to their local pharmacy.  Unfortunately, when they went to pick it up today, it would cost them $168, which they could not afford at this time.  This information was shared with Dr. [name] and Raina Mina RN to see if additional arrangements could be made to assist with the cost / procurement of the medication.  I told Mr. Nikkel that our office would be back in touch ASAP.

## 2015-05-11 ENCOUNTER — Other Ambulatory Visit: Payer: Self-pay | Admitting: Oncology

## 2015-05-12 ENCOUNTER — Other Ambulatory Visit: Payer: Self-pay | Admitting: Internal Medicine

## 2015-05-12 ENCOUNTER — Telehealth: Payer: Self-pay | Admitting: Pharmacist

## 2015-05-12 MED ORDER — LEVOFLOXACIN 750 MG PO TABS: 750.0000 mg | ORAL_TABLET | Freq: Every day | ORAL | Status: DC

## 2015-05-12 NOTE — Telephone Encounter (Signed)
Spoke with patient and her husband on the phone yesterday regarding letrozole. Pt copay is $168 and cannot afford this. Pt will come by to pick up samples (2 bottles) as well as Prescription coupon for her pharmacy. This discount rx card will hopefully bring the copay down to $20 depending on if her pharmacy accepts the card. This card will be used in place of her [employer] which lists letrozole as a Tier 3 preferred brand product vs. The anastrazole that she was on previously that was a Tier 2 generic and had a much lower copay.   Thank you,  Montel Clock, PharmD, [facility]

## 2015-05-16 ENCOUNTER — Telehealth: Payer: Self-pay | Admitting: Pharmacist

## 2015-05-16 NOTE — Telephone Encounter (Signed)
Received call today from patient and patient's husband. Medication discount card provided was successful for letrozole. Copay cost for 3 month supply at patient's [facility] decreased to $22 (originally $168 per month). Patient and patient's husband very pleased. Ms. Knodel started the letrozole yesterday on 05/15/15.   Thank you,  Montel Clock, PharmD, [facility]

## 2015-06-02 ENCOUNTER — Other Ambulatory Visit: Payer: Self-pay | Admitting: *Deleted

## 2015-06-02 DIAGNOSIS — C50312 Malignant neoplasm of lower-inner quadrant of left female breast: Secondary | ICD-10-CM

## 2015-06-02 MED ORDER — LETROZOLE 2.5 MG PO TABS: 2.5000 mg | ORAL_TABLET | Freq: Every day | ORAL | Status: DC

## 2015-06-05 ENCOUNTER — Telehealth: Payer: Self-pay | Admitting: *Deleted

## 2015-06-05 ENCOUNTER — Other Ambulatory Visit: Payer: Self-pay | Admitting: Internal Medicine

## 2015-06-05 ENCOUNTER — Encounter: Payer: Self-pay | Admitting: Oncology

## 2015-06-05 DIAGNOSIS — C50312 Malignant neoplasm of lower-inner quadrant of left female breast: Secondary | ICD-10-CM

## 2015-06-05 MED ORDER — LETROZOLE 2.5 MG PO TABS: 2.5000 mg | ORAL_TABLET | Freq: Every day | ORAL | Status: DC

## 2015-06-05 NOTE — Telephone Encounter (Signed)
Call from [facility] reporting "letrozole had to be transferred to [facility].  [place] number is [phone]."

## 2015-06-05 NOTE — Progress Notes (Signed)
Letrozole has been transferred to [facility] per [facility]. [phone]

## 2015-06-12 ENCOUNTER — Ambulatory Visit: Payer: Medicare HMO | Admitting: Internal Medicine

## 2015-06-19 ENCOUNTER — Encounter: Payer: Self-pay | Admitting: Internal Medicine

## 2015-07-06 ENCOUNTER — Encounter: Payer: Self-pay | Admitting: Internal Medicine

## 2015-07-06 ENCOUNTER — Ambulatory Visit (INDEPENDENT_AMBULATORY_CARE_PROVIDER_SITE_OTHER)
Admission: RE | Admit: 2015-07-06 | Discharge: 2015-07-06 | Disposition: A | Discharge status: Home / Self Care | Payer: Medicare HMO | Source: Ambulatory Visit | Attending: Internal Medicine | Admitting: Internal Medicine

## 2015-07-06 ENCOUNTER — Ambulatory Visit (INDEPENDENT_AMBULATORY_CARE_PROVIDER_SITE_OTHER): Payer: Medicare HMO | Admitting: Internal Medicine

## 2015-07-06 VITALS — BP 132/82 | HR 65 | Ht 63.5 in | Wt 155.0 lb

## 2015-07-06 DIAGNOSIS — J449 Chronic obstructive pulmonary disease, unspecified: Secondary | ICD-10-CM

## 2015-07-06 DIAGNOSIS — F17200 Nicotine dependence, unspecified, uncomplicated: Secondary | ICD-10-CM

## 2015-07-06 DIAGNOSIS — I6789 Other cerebrovascular disease: Secondary | ICD-10-CM

## 2015-07-06 MED ORDER — CEFDINIR 300 MG PO CAPS: 300.0000 mg | ORAL_CAPSULE | Freq: Two times a day (BID) | ORAL | Status: DC

## 2015-07-06 NOTE — Assessment & Plan Note (Signed)
Keeps gaze fixed towards right, may reflect visual field cut

## 2015-07-06 NOTE — Assessment & Plan Note (Signed)
Has not committed to lifestyle change for smoking cessation. Discussed again.

## 2015-07-06 NOTE — Progress Notes (Signed)
Patient ID: [name], female    DOB: November 18, 1940, 74 y.o.   MRN: [id number]  HPI 68 yoF 1/2 PPD smoker returning for f/u of COPD and allergic rhinitis. PFT 2011 showed mild obstruction w/ severe reduction DLCO. Wears elastic sleeve right arm since remote mastectomy. Lat here September 23,2011. She and her husband shared a chest cold 1 month ago, resolved back to baseline w/o presciption med. She admits frequent wheeze. Gets short of breath on long walks, housework.  Uses Albuterol about 3 x/ daily, but hasn't used Advair saying "I didn't know what it was for". Little change day to day or since last here.  Having Spring seasonal nasal congestion and blowing for the past month. Eyes itch. Tried benadryl, but made her too sleepy.  Denies chest pains, blood or nodes. Occasional palpitation.  She would like to stop smoking, but hasn't tried even patches.   05/23/11- 64 yoF 1/2 PPD smoker returning for f/u of COPD and allergic rhinitis, complicated by history of CVA, breast cancer. She is trying to quit smoking. Now down to 2 or 3 cigarettes a day using an electronic cigarette instead and tried to taper the strength of that. She thinks her breathing is better. Only occasional albuterol, less than once per week. Had flu and pneumococcal vaccines and a TB skin test negative, this year. Right-sided chest and nose both seem the most congested. Occasionally, cough or blow out some phlegm. She denies blood or chest pain. CT chest 12/14/2009 showed changes of COPD, cardiac enlargement, no PE.  12/30/11- 6 yoF 1/2 PPD smoker returning for f/u of COPD and allergic rhinitis, complicated by history of CVA, breast cancer C/O cough with white mucus and wheezing since 5/29. Denies sob, chest pain, and chest tightness. Still smoking 5 or 6 cigarettes a day despite repeated counseling. After the winter comfortably. Blames the spring allergy for some nasal congestion. Saw her primary physician in May for bronchitis.  Doxycycline caused diarrhea. She completed prednisone taper. Now still has productive cough with white phlegm. Denies fever or adenopathy.  CXR 05/29/11- IMPRESSION:  Stable chest x-ray. No active lung disease.  Original Report Authenticated By: Joretta Bachelor, M.D.    08/03/12- 72 yoF 1/2 PPD smoker returning for f/u of COPD and allergic rhinitis, complicated by history of CVA, breast cancer/ lymphedema FOLLOWS FOR: had been doing good with breathing until today; started coughing-productive-yellow in color; SOB and wheezing today as well. No effort to stop smoking despite her discussions. Reports doing very well until the last day or so-new onset of postnasal drip, cough, scant yellow sputum without fever swollen glands or sore throat. Using her Spiriva about 3 times per week and lost her rescue inhaler. CXR 05/19/12 IMPRESSION:  Stable chest x-ray. No active lung disease. Prior right  mastectomy.  Original Report Authenticated By: Joretta Bachelor, M.D.   04/06/13- 4 yoF 1/2 PPD smoker returning for f/u of COPD and allergic rhinitis, complicated by history of CVA, breast cancer/ lymphedema FOLLOWS FOR:sob-same,cough-yellow,wheezing,denies cp and tightness No effort to stop smoking. Some sinus congestion. Rescue inhaler < 1x/ day.  11/04/13- 42 yoF 1/2 PPD smoker returning for f/u of COPD and allergic rhinitis, complicated by history of CVA, breast cancer/ lymphedema FOLLOWS FOR: breathing is fine but having flare up of allergies;cough-productive(yellow to green in color), Wheezing as well. Using E-cig 6 mg, not tobacco cigs now. Notices nasal congestion CXR 04/06/13 IMPRESSION:  No active cardiopulmonary disease.  Electronically Signed  By: Rolm Baptise  M.D.  On: 04/06/2013 14:49  08/25/14- 42 yoF 1/2 PPD smoker returning for f/u of COPD and allergic rhinitis, complicated by history of CVA, breast cancer/ lymphedema FOLLOWS FOR: Recent caught cold(last week) and is making her breathing worse.  Has had Zpak and OTC meds. Also, tried to take Prednisone and was unable to do so-nervous during the day and stays awake at night. Finished Z-Pak last week for chest congestion. Easy shortness of breath with exertion, cough productive yellow but no fever or blood. No effort to stop smoking but using E cigarettes more now. 5 or 6 tobacco cigarettes daily. Pro air and Spiriva are usually enough.  07/06/2015-74 year old female one half pack per day smoker followed for COPD, allergic rhinitis, complicated by history CVA, breast cancer/lymphedema Follows for: COPD. Pt c/o cough with yellow mucus, wheeze and SOB for a week. Pt denies CP/tightness.  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 Mild sense of chest congestion and increased sputum without fever, sore throat, blood. Little change in smoking pattern-few cigarettes daily despite our counseling efforts.  Review of Systems-See HPI Constitutional:   No-   weight loss, night sweats, fevers, chills, fatigue, lassitude. HEENT:   No-  headaches, difficulty swallowing, tooth/dental problems, sore throat,       No-  sneezing, itching, ear ache, +nasal congestion, +post nasal drip,  CV:  No-   chest pain, orthopnea, PND, swelling in lower extremities, anasarca, dizziness, palpitations Resp: +  shortness of breath with exertion or at rest.             +- productive cough,  + non-productive cough,  No- coughing up of blood.             No-change in color of mucus.  No- wheezing.   Skin: No-   rash or lesions. GI:  No-   heartburn, indigestion, abdominal pain, nausea, vomiting, GU: . MS:  No-   joint pain or swelling.   Neuro-     nothing unusual Psych:  No- change in mood or affect. No depression or anxiety.  No memory loss.  Objective:   Physical Exam  General- Alert, Oriented, Affect-appropriate, Distress- none acute, +odor of tobacco Skin- rash-none, lesions- none, excoriation- none Lymphadenopathy- none Head-  atraumatic            Eyes- Gross vision intact, PERRLA, conjunctivae clear secretions            Ears- Hearing, canals-normal            Nose- +turbinate edema, no-Septal dev, mucus, polyps, erosion, perforation             Throat- Mallampati III , mucosa clear , drainage- none, tonsils- atrophic, dentures Neck- flexible , trachea midline, no stridor , thyroid nl, carotid no bruit Chest - symmetrical excursion , unlabored           Heart/CV- RRR , no murmur , no gallop  , no rub, nl s1 s2                           - JVD- none , edema- none, stasis changes- none, varices- none           Lung- +diminished breath sounds, wheeze-none, unlabored, +Loose cough, dullness-none, rub- none           Chest wall-  Abd-  Br/ Gen/ Rectal- Not done, not indicated Extrem- cyanosis- none, clubbing, none, atrophy- none, strength- nl. +Right  arm  remains in the elastic sleeve. Neuro- + gaze to right

## 2015-07-06 NOTE — Patient Instructions (Signed)
Script for cefdinir antibiotic sent to [facility] you take an otc probiotic like Florastor while you are on the antibiotic to help your stomach  Order CXR     Dx COPD mixed type, tobacco user  Please try very hard now to stop smoking. You need to be feeling better.

## 2015-07-06 NOTE — Assessment & Plan Note (Signed)
Chronic bronchitis component. There may be mild exacerbation but not dramatic change. Obvious problems ongoing smoking. Plan- cefdinir, probiotic, chest x-ray

## 2015-07-10 ENCOUNTER — Other Ambulatory Visit: Payer: Self-pay

## 2015-07-10 DIAGNOSIS — C50912 Malignant neoplasm of unspecified site of left female breast: Secondary | ICD-10-CM

## 2015-07-10 DIAGNOSIS — C50312 Malignant neoplasm of lower-inner quadrant of left female breast: Secondary | ICD-10-CM

## 2015-07-12 ENCOUNTER — Other Ambulatory Visit (HOSPITAL_BASED_OUTPATIENT_CLINIC_OR_DEPARTMENT_OTHER): Payer: Medicare HMO

## 2015-07-12 ENCOUNTER — Ambulatory Visit (HOSPITAL_BASED_OUTPATIENT_CLINIC_OR_DEPARTMENT_OTHER): Payer: Medicare HMO | Admitting: Oncology

## 2015-07-12 VITALS — BP 144/57 | HR 76 | Temp 97.8°F | Resp 18 | Ht 63.5 in | Wt 155.6 lb

## 2015-07-12 DIAGNOSIS — C50312 Malignant neoplasm of lower-inner quadrant of left female breast: Secondary | ICD-10-CM

## 2015-07-12 DIAGNOSIS — C50912 Malignant neoplasm of unspecified site of left female breast: Secondary | ICD-10-CM

## 2015-07-12 DIAGNOSIS — Z79811 Long term (current) use of aromatase inhibitors: Secondary | ICD-10-CM

## 2015-07-12 DIAGNOSIS — Z170 Estrogen receptor positive status [ER+]: Secondary | ICD-10-CM

## 2015-07-12 LAB — CBC WITH DIFFERENTIAL/PLATELET
BASO%: 0.6 % (ref 0.0–2.0)
BASOS ABS: 0.1 10*3/uL (ref 0.0–0.1)
EOS%: 2.2 % (ref 0.0–7.0)
Eosinophils Absolute: 0.2 10*3/uL (ref 0.0–0.5)
HEMATOCRIT: 43 % (ref 34.8–46.6)
HGB: 14.4 g/dL (ref 11.6–15.9)
LYMPH#: 4.7 10*3/uL — AB (ref 0.9–3.3)
LYMPH%: 54.1 % — ABNORMAL HIGH (ref 14.0–49.7)
MCH: 32.6 pg (ref 25.1–34.0)
MCHC: 33.6 g/dL (ref 31.5–36.0)
MCV: 96.9 fL (ref 79.5–101.0)
MONO#: 0.7 10*3/uL (ref 0.1–0.9)
MONO%: 8.2 % (ref 0.0–14.0)
NEUT#: 3 10*3/uL (ref 1.5–6.5)
NEUT%: 34.9 % — AB (ref 38.4–76.8)
PLATELETS: 242 10*3/uL (ref 145–400)
RBC: 4.43 10*6/uL (ref 3.70–5.45)
RDW: 12.9 % (ref 11.2–14.5)
WBC: 8.7 10*3/uL (ref 3.9–10.3)

## 2015-07-12 LAB — COMPREHENSIVE METABOLIC PANEL
ALBUMIN: 4 g/dL (ref 3.5–5.0)
ALK PHOS: 82 U/L (ref 40–150)
ALT: 36 U/L (ref 0–55)
AST: 32 U/L (ref 5–34)
Anion Gap: 9 mEq/L (ref 3–11)
BUN: 17.2 mg/dL (ref 7.0–26.0)
CO2: 22 meq/L (ref 22–29)
Calcium: 10.2 mg/dL (ref 8.4–10.4)
Chloride: 107 mEq/L (ref 98–109)
Creatinine: 1 mg/dL (ref 0.6–1.1)
EGFR: 54 mL/min/{1.73_m2} — AB (ref >90)
GLUCOSE: 155 mg/dL — AB (ref 70–140)
POTASSIUM: 4 meq/L (ref 3.5–5.1)
SODIUM: 138 meq/L (ref 136–145)
Total Bilirubin: 0.44 mg/dL (ref 0.20–1.20)
Total Protein: 7.7 g/dL (ref 6.4–8.3)

## 2015-07-12 LAB — TECHNOLOGIST REVIEW

## 2015-07-12 MED ORDER — LETROZOLE 2.5 MG PO TABS: 2.5000 mg | ORAL_TABLET | Freq: Every day | ORAL | Status: DC

## 2015-07-12 MED ORDER — OMEPRAZOLE 40 MG PO CPDR: 40.0000 mg | DELAYED_RELEASE_CAPSULE | Freq: Every day | ORAL | Status: DC

## 2015-07-12 MED ORDER — NAPROXEN 500 MG PO TABS: 500.0000 mg | ORAL_TABLET | Freq: Two times a day (BID) | ORAL | Status: DC

## 2015-07-12 NOTE — Progress Notes (Signed)
[place]  [phone] [phone]     ID: JAYMEE TILSON DOB: December 31, 1940  MR#: [id number]  STM#:[id number]  Patient Care Team: [name], MD as PCP - General (Internal Medicine) Inda Castle, MD as Consulting Physician (Gastroenterology) Deneise Lever, MD as Consulting Physician (Pulmonary Disease) Marica Otter, OD (Optometry) [name], MD as Consulting Physician (General Surgery) Chauncey Cruel, MD as Consulting Physician (Oncology) Eppie Gibson, MD as Attending Physician (Radiation Oncology) Mauro Kaufmann, RN as Registered Nurse Rockwell Germany, RN as Registered Nurse Sylvan Cheese, NP as Nurse Practitioner (Nurse Practitioner) PCP: Alesia Richards, MD GYN: Everlene Farrier MD OTHER MD: Keturah Barre MD  CHIEF COMPLAINT: Estrogen receptor positive breast cancer  CURRENT TREATMENT: Letrozole BREAST CANCER HISTORY: From the original intake note:  "[name]" has a history of right breast cancer, status post right modified radical mastectomy in 1990. The patient tells me the tumor was "pretty day", and that they took 13 lymph nodes out. She does not know if the cancer was in the lymph nodes. She received no adjuvant therapy. She also has a history of uterine cancer in her 80s.  More recently, she had routine screening mammography September 2015 which showed a possible mass in the left breast. Diagnostic left mammography and left ultrasonography 04/11/2014 at the left breast showed an area of focal asymmetry measuring 8 mm in the left breast which was not palpable and not seen by ultrasonography.  Six-month follow-up was recommended and this was performed 10/24/2014, this time with tomography. The breast density was category C. There was an irregular subcentimeter mass in the left breast 8:00 position with coarse calcifications. This was not palpable, but this time ultrasound showed a hypoechoic left breast mass at the area in  question, measuring 0.5 cm.  Biopsy of the left breast mass 10/24/2014 showed ([id number]) an invasive ductal carcinoma, grade 1 or 2, estrogen receptor 100% positive, progesterone receptor 75% positive, both with strong staining intensity, with an MIB-1 of 11% and no HER-2 amplification, the signals ratio being 1.09 and the number per cell 1.80.  The patient's subsequent history is as detailed below  INTERVAL HISTORY: [name] returns today for follow-up of her estrogen receptor positive breast cancer accompanied by her husband [name]. After the last visit here we switched her to exemestane but it wasn't possible to get that medication for her at a prize that she could afford. With the help of our oral therapy pharmacist we have been able to get letrozole for her at a reasonable price. This started approximately a month ago. She is tolerating it better. She does have arthralgias and myalgias as before but they seem to be a little bit less or perhaps she is more used to them.  REVIEW OF SYSTEMS: She smokes between a half a pack of cigarettes per day. Of course her vision is very poor. She has significant walking difficulties and she has chronic right arm problems as well as right leg issues, she tells me. Her diabetes is not well-controlled. Aside from these issues a detailed review of systems today was stable  PAST MEDICAL HISTORY: Past Medical History  Diagnosis Date  . CVA (cerebral vascular accident) ([place]) 1985  . Tobacco abuse   . Allergic rhinitis   . Hypertension   . Hyperlipidemia   . Elevated hemoglobin A1c measurement   . COPD (chronic obstructive pulmonary disease) ([place])   . Lymphocytosis     right arm  . ASCVD (arteriosclerotic  cardiovascular disease)   . CVA (cerebral vascular accident) ([place])   . Atherosclerosis of aorta ([place]) 2014    via CT AB/Pelvis- no AAA  . Ovarian cyst   . SVD (spontaneous vaginal delivery)     x 2  . Shortness of breath     occasional  . Diabetes  mellitus without complication ([place])   . DJD (degenerative joint disease)     neck, lower back, feet, hands  . Breast cancer ([place]) 1990    right  . 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 Cancer of lower-inner quadrant of female breast ([place]) 10/26/2014  . Full dentures   . Impaired vision     macular degeneration  . Weakness of right side of body     PAST SURGICAL HISTORY: Past Surgical History  Procedure Laterality Date  . Mastectomy  1990    Right, no adjuvant  . Cholecystectomy  2006  . Abdominal hysterectomy  1976  . Cholecystectomy N/A 2008    Dr. Rosana Hoes  . Excision of benign salivary tumor Left 1968  . Shoulder surgery Left 2007    Dr. Percell Miller  . Back surgery  2005    lower back  . Multiple tooth extractions    . Bladder tact  1985  . Eye surgery      bilateral cataract eye surgery - now low vision  . Laparoscopic bilateral salpingo oopherectomy Bilateral 11/30/2013    Procedure: LAPAROSCOPIC RIGHT SALPINGO OOPHORECTOMY with extensive lysis of adhesions;  Surgeon: Allena Katz, MD;  Location: [facility];  Service: Gynecology;  Laterality: Bilateral;  . Breast surgery  1990    rightmastectomy-axillary nodes  . Breast lumpectomy with radioactive seed localization Left 11/30/2014    Procedure: LEFT BREAST LUMPECTOMY WITH RADIOACTIVE SEED LOCALIZATION;  Surgeon: [name], MD;  Location: [facility];  Service: General;  Laterality: Left;    FAMILY HISTORY Family History  Problem Relation Age of Onset  . Emphysema Brother   . Emphysema Brother   . Emphysema Sister   . Allergies Brother     all brothers and sisters  . Asthma Sister   . Asthma Sister   . Heart disease Mother   . Cancer Mother     breast  . Heart disease Sister   . Heart disease Brother   . Cancer Sister     breast  . Colon cancer Neg Hx    the patient's father died from bladder cancer the age of 68. The patient's mother died from heart disease at the age of 22. The  patient had 5 brothers and 7 sisters. The patient's mother and 3 of her sisters had breast cancer. There is no history of ovarian cancer in the family  GYNECOLOGIC HISTORY:  No LMP recorded. Patient is postmenopausal. Menarche age 53, first live birth age 45. She is GX P2. She did not recall exactly when she went through menopause, but she underwent hysterectomy and unilateral salpingo-oophorectomy remotely. She never took hormone replacement. She took oral contraceptives approximately for 3 years with no complications  SOCIAL HISTORY:  [name] is a homemaker. She is very limited in what she can do at present. Her husband Gwyndolyn Saxon "[name]" Stead is retired from Biomedical engineer. Their daughter Butch Penny lives in [place] and is a health caregiver. Son [name] lives in [place] and works in home improvement. The patient has 5 grandchildren and 6 great-grandchildren. She is a Psychologist, forensic    ADVANCED  DIRECTIVES: Not in place   HEALTH MAINTENANCE: Social History  Substance Use Topics  . Smoking status: Current Every Day Smoker -- 1.00 packs/day for 55 years    Types: Cigarettes  . Smokeless tobacco: Current User     Comment: Electronic cig as well @ 0.15m of e cig only  . Alcohol Use: No     Colonoscopy: November 2015  PAP: Status post hysterectomy  Bone density: 06/26/2011, normal  Lipid panel:  Allergies  Allergen Reactions  . Bisoprolol Other (See Comments)    Decreased Heart Rate  . Ace Inhibitors Cough  . Acyclovir And Related Rash and Other (See Comments)    Okay to take famvir  . Amoxil [Amoxicillin] Diarrhea  . Ciprofloxacin Diarrhea  . Crestor [Rosuvastatin] Other (See Comments)    Elevated LFT's and Myalgias  . Doxycycline Diarrhea  . Enalapril Cough  . Lipitor [Atorvastatin] Other (See Comments)    Elevated LFT's and Myalgias  . Metformin And Related Diarrhea  . Prednisone Other (See Comments)    Intolerant to High Dose Prednisone  . Vicodin  [Hydrocodone-Acetaminophen] Other (See Comments)    Increased Anxiety and Nervousness    Current Outpatient Prescriptions  Medication Sig Dispense Refill  . acetaminophen-codeine (TYLENOL #3) 300-30 MG per tablet Take 1-2 tablets by mouth every 4 (four) hours as needed. 40 tablet 0  . albuterol (PROVENTIL HFA;VENTOLIN HFA) 108 (90 BASE) MCG/ACT inhaler Inhale 2 puffs into the lungs every 6 (six) hours as needed for wheezing or shortness of breath.    .Marland Kitchenaspirin 81 MG tablet Take 81 mg by mouth daily.      . cefdinir (OMNICEF) 300 MG capsule Take 1 capsule (300 mg total) by mouth 2 (two) times daily. 14 capsule 0  . Cholecalciferol (VITAMIN D3) 2000 UNITS TABS Take 6,000 Units by mouth daily.     . fenofibrate micronized (LOFIBRA) 134 MG capsule TAKE ONE CAPSULE BY MOUTH ONCE DAILY. 90 capsule 1  . fish oil-omega-3 fatty acids 1000 MG capsule Take 1 capsule by mouth 3 (three) times daily.     . Flaxseed, Linseed, (FLAX SEED OIL) 1000 MG CAPS Take 1,000 mg by mouth 3 (three) times daily.     . fluticasone (FLONASE) 50 MCG/ACT nasal spray Place 2 sprays into both nostrils daily. 16 g 1  . furosemide (LASIX) 20 MG tablet Take 1 tablet (20 mg total) by mouth daily. (Patient not taking: Reported on 07/06/2015) 30 tablet 3  . letrozole (FEMARA) 2.5 MG tablet Take 1 tablet (2.5 mg total) by mouth daily. 90 tablet 3  . losartan (COZAAR) 100 MG tablet TAKE ONE TABLET BY MOUTH ONCE DAILY 90 tablet 1  . Magnesium 500 MG TABS Take 500 mg by mouth 2 (two) times daily.     . metFORMIN (GLUCOPHAGE-XR) 500 MG 24 hr tablet TAKE FOUR TABLETS BY MOUTH ONCE DAILY 360 tablet 1  . promethazine-dextromethorphan (PROMETHAZINE-DM) 6.25-15 MG/5ML syrup Take 5 mL as needed for cough 360 mL 1  . tiotropium (SPIRIVA) 18 MCG inhalation capsule Place 18 mcg into inhaler and inhale daily as needed (for wheezing).      No current facility-administered medications for this visit.    OBJECTIVE: Older white woman who appears  stated age F58Vitals:   07/12/15 1138  BP: 144/57  Pulse: 76  Temp: 97.8 F (36.6 C)  Resp: 18     Body mass index is 27.13 kg/(m^2).    ECOG FS:2 - Symptomatic, <50% confined to bed  Sclerae  unicteric, pupils round and equal Oropharynx clear and moist-- no thrush or other lesions No cervical or supraclavicular adenopathy Lungs no rales or rhonchi Heart regular rate and rhythm Abd soft, nontender, positive bowel sounds MSK no focal spinal tenderness, chronic grade 1 right upper extremity lymphedema Neuro: well oriented, appropriate affect Breasts: The right breast is status post remote mastectomy. There is no evidence of local recurrence. The right axilla is benign per the left breast is status post lumpectomy and radiation. There is no evidence of local recurrence. The left axilla is benign.    LAB RESULTS:  CMP     Component Value Date/Time   NA 138 04/05/2015 1610   NA 138 11/02/2014 1204   K 4.0 04/05/2015 1610   K 4.1 11/02/2014 1204   CL 102 04/05/2015 1610   CO2 24 04/05/2015 1610   CO2 23 11/02/2014 1204   GLUCOSE 82 04/05/2015 1610   GLUCOSE 214* 11/02/2014 1204   BUN 12 04/05/2015 1610   BUN 15.6 11/02/2014 1204   CREATININE 0.88 04/05/2015 1610   CREATININE 0.84 11/30/2014 0652   CREATININE 1.0 11/02/2014 1204   CALCIUM 10.1 04/05/2015 1610   CALCIUM 10.3 11/02/2014 1204   PROT 6.9 04/05/2015 1610   PROT 7.4 11/02/2014 1204   ALBUMIN 4.2 04/05/2015 1610   ALBUMIN 4.0 11/02/2014 1204   AST 34 04/05/2015 1610   AST 23 11/02/2014 1204   ALT 40* 04/05/2015 1610   ALT 33 11/02/2014 1204   ALKPHOS 81 04/05/2015 1610   ALKPHOS 97 11/02/2014 1204   BILITOT 0.4 04/05/2015 1610   BILITOT 0.38 11/02/2014 1204   GFRNONAA 65 04/05/2015 1610   GFRNONAA >60 11/30/2014 0652   GFRAA 75 04/05/2015 1610   GFRAA >60 11/30/2014 0652    INo results found for: SPEP, UPEP  Lab Results  Component Value Date   WBC 8.7 07/12/2015   NEUTROABS 3.0 07/12/2015   HGB  14.4 07/12/2015   HCT 43.0 07/12/2015   MCV 96.9 07/12/2015   PLT 242 07/12/2015      Chemistry      Component Value Date/Time   NA 138 04/05/2015 1610   NA 138 11/02/2014 1204   K 4.0 04/05/2015 1610   K 4.1 11/02/2014 1204   CL 102 04/05/2015 1610   CO2 24 04/05/2015 1610   CO2 23 11/02/2014 1204   BUN 12 04/05/2015 1610   BUN 15.6 11/02/2014 1204   CREATININE 0.88 04/05/2015 1610   CREATININE 0.84 11/30/2014 0652   CREATININE 1.0 11/02/2014 1204      Component Value Date/Time   CALCIUM 10.1 04/05/2015 1610   CALCIUM 10.3 11/02/2014 1204   ALKPHOS 81 04/05/2015 1610   ALKPHOS 97 11/02/2014 1204   AST 34 04/05/2015 1610   AST 23 11/02/2014 1204   ALT 40* 04/05/2015 1610   ALT 33 11/02/2014 1204   BILITOT 0.4 04/05/2015 1610   BILITOT 0.38 11/02/2014 1204       No results found for: LABCA2  No components found for: LABCA125  No results for input(s): INR in the last 168 hours.  Urinalysis    Component Value Date/Time   COLORURINE YELLOW 07/05/2013 [place] 07/05/2013 1658   LABSPEC 1.015 07/05/2013 1658   PHURINE 7.5 07/05/2013 1658   GLUCOSEU 100* 07/05/2013 1658   HGBUR NEG 07/05/2013 1658   BILIRUBINUR NEG 07/05/2013 1658   KETONESUR NEG 07/05/2013 1658   PROTEINUR NEG 07/05/2013 1658   UROBILINOGEN 0.2 07/05/2013 1658  NITRITE NEG 07/05/2013 1658   LEUKOCYTESUR NEG 07/05/2013 1658    STUDIES: Dg Chest 2 View  07/07/2015  CLINICAL DATA:  COPD. EXAM: CHEST  2 VIEW COMPARISON:  08/25/2014. FINDINGS: Mediastinum hilar structures normal. Lungs are clear. Heart size stable. No pleural effusion or pneumothorax. Surgical clips over the right axilla and left chest base. Right mastectomy. Degenerative changes scoliosis thoracic spine. Degenerative changes both shoulders. No acute bony abnormality. Cholecystectomy. IMPRESSION: 1. No acute cardiopulmonary disease. 2. Right mastectomy. Electronically Signed   By: Marcello Moores  Register   On: 07/07/2015  08:11    ASSESSMENT: 74 y.o. [place] woman status post left breast biopsy 10/24/2014 for a clinical T1 N0 invasive ductal carcinoma, grade 1 or 2, estrogen and progesterone receptor positive, HER-2 negative, with an MIB-1 of 11%  (1) status post left lumpectomy 11/30/2014 for a pT1b pNX, stage IA invasive ductal carcinoma, grade 1, again HER-2 negative, with negative margins  (2) anastrozole started 12/30/2014-- discontinued 03/31/2015 because of side effects  (a) exemestane prescribed 03/31/2015-- not started because of cost issues  (b) letrozole started   (3) if the patient is unable to tolerate aromatase inhibitors, she still refuses radiation  (4) status post right modified radical mastectomy in 1994 what was at least a stage II breast cancer, with no adjuvant therapy given  (5) status post hysterectomy with unilateral salpingo-oophorectomy for endometrial cancer in the patient's 58s, no further data available  PLAN: Ligaya is having some of the same aches and pains that she was having with anastrozole now with letrozole. However she is either more motivated or the symptoms have become more bearable. At any rate she is willing to continue this medication for 5 years, which is the overall plan.  She requested something for pain. I am writing her for naproxen 500 mg to take 3 times a day as needed, preferably with food. I am also writing her for omeprazole 40 mg to take daily.  She needs a new compression sleeve and I wrote her for that as well.  We discussed smoking cessation. At this point she is not motivated to quit. She did just have a chest x-ray which was unremarkable  She is going to have her next mammogram in April and she will return to see [place] at that time. Assuming all continues well we will start seeing her on an every 6 month basis thereafter.  She has a good understanding of this plan. She knows to call for any problems that may develop before her next visit  here. Chauncey Cruel, MD   07/12/2015 11:49 AM Medical Oncology and Hematology [facility] [address] [place], [place] [address] Tel. [phone]    Fax. [phone]

## 2015-07-13 ENCOUNTER — Telehealth: Payer: Self-pay | Admitting: Nurse Practitioner

## 2015-07-13 NOTE — Telephone Encounter (Signed)
Received call from Mr. Rabelo regarding his wife, Olia Hinderliter.  They saw Dr. [name] yesterday for follow up and were given a prescription for omeprazole.  They were unsure what this medication was for.  I reinforced that it was being prescribed to help prevent any stomach issues related to the new pain medication she was also prescribed, naproxen, yesterday for the myalgias associated with her letrozole.  I advised them to have her take the omeprazole daily and to let us know if she develops any problems.  They inquired whether the letrozole was the appropriate aromatase inhibitor that she should be taking and I confirmed this.  Pt / husband without further questions.

## 2015-07-14 ENCOUNTER — Telehealth: Payer: Self-pay | Admitting: Oncology

## 2015-07-14 NOTE — Telephone Encounter (Signed)
Mailed a calendar and letter to patient

## 2015-08-01 ENCOUNTER — Ambulatory Visit (INDEPENDENT_AMBULATORY_CARE_PROVIDER_SITE_OTHER): Payer: PPO | Admitting: Internal Medicine

## 2015-08-01 ENCOUNTER — Encounter: Payer: Self-pay | Admitting: Internal Medicine

## 2015-08-01 VITALS — BP 136/80 | HR 72 | Temp 97.3°F | Resp 16 | Ht 63.0 in | Wt 155.1 lb

## 2015-08-01 DIAGNOSIS — E1122 Type 2 diabetes mellitus with diabetic chronic kidney disease: Secondary | ICD-10-CM | POA: Diagnosis not present

## 2015-08-01 DIAGNOSIS — E782 Mixed hyperlipidemia: Secondary | ICD-10-CM | POA: Diagnosis not present

## 2015-08-01 DIAGNOSIS — N183 Chronic kidney disease, stage 3 (moderate): Secondary | ICD-10-CM | POA: Diagnosis not present

## 2015-08-01 DIAGNOSIS — I10 Essential (primary) hypertension: Secondary | ICD-10-CM

## 2015-08-01 DIAGNOSIS — R6889 Other general symptoms and signs: Secondary | ICD-10-CM | POA: Diagnosis not present

## 2015-08-01 DIAGNOSIS — Z79899 Other long term (current) drug therapy: Secondary | ICD-10-CM

## 2015-08-01 DIAGNOSIS — K5732 Diverticulitis of large intestine without perforation or abscess without bleeding: Secondary | ICD-10-CM

## 2015-08-01 DIAGNOSIS — E559 Vitamin D deficiency, unspecified: Secondary | ICD-10-CM | POA: Diagnosis not present

## 2015-08-01 DIAGNOSIS — E1142 Type 2 diabetes mellitus with diabetic polyneuropathy: Secondary | ICD-10-CM | POA: Diagnosis not present

## 2015-08-01 DIAGNOSIS — J449 Chronic obstructive pulmonary disease, unspecified: Secondary | ICD-10-CM

## 2015-08-01 DIAGNOSIS — F17200 Nicotine dependence, unspecified, uncomplicated: Secondary | ICD-10-CM

## 2015-08-01 DIAGNOSIS — Z0001 Encounter for general adult medical examination with abnormal findings: Secondary | ICD-10-CM

## 2015-08-01 DIAGNOSIS — C50312 Malignant neoplasm of lower-inner quadrant of left female breast: Secondary | ICD-10-CM

## 2015-08-01 DIAGNOSIS — H548 Legal blindness, as defined in USA: Secondary | ICD-10-CM

## 2015-08-01 DIAGNOSIS — Z0000 Encounter for general adult medical examination without abnormal findings: Secondary | ICD-10-CM | POA: Diagnosis not present

## 2015-08-01 DIAGNOSIS — I6789 Other cerebrovascular disease: Secondary | ICD-10-CM

## 2015-08-01 DIAGNOSIS — Z1212 Encounter for screening for malignant neoplasm of rectum: Secondary | ICD-10-CM

## 2015-08-01 DIAGNOSIS — E1129 Type 2 diabetes mellitus with other diabetic kidney complication: Secondary | ICD-10-CM | POA: Diagnosis not present

## 2015-08-01 MED ORDER — METRONIDAZOLE 500 MG PO TABS: ORAL_TABLET | ORAL | Status: DC

## 2015-08-01 MED ORDER — CIPROFLOXACIN HCL 500 MG PO TABS: ORAL_TABLET | ORAL | Status: DC

## 2015-08-01 NOTE — Progress Notes (Signed)
Patient ID: [name], female   DOB: February 13, 1941, 75 y.o.   MRN: [id number]  Annual Screening/Preventative Visit And Comprehensive Evaluation &  Examination  This very nice 75 y.o. MWF presents for presents for a Wellness/Preventative Visit & comprehensive evaluation and management of multiple medical co-morbidities.  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 Patient has hx/o R Mastectomy for Ca in 1990 and more recently had L Breast lumpectomy by Dr [name] in May 2016 and is followed by Dr [name]. 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 Further patient has prior hx/o Diverticulitis and relates 2-3 week hx/o LLQ pains/discomfort and occasional diarrhea, but no rectal bleeding.    HTN predates since 59. 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  Patient's BP has been controlled at home and patient denies any cardiac symptoms as chest pain, palpitations, shortness of breath, dizziness or ankle swelling. Today's BP: 136/80 mmHg    Patient's hyperlipidemia is controlled with diet and medications. Patient denies myalgias or other medication SE's. Last lipids were at goal with  Cholesterol 171; HDL 32*; LDL 81; but elevated Triglycerides 292 on 04/05/2015.   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 Last A1c was 6.8% on 04/05/2015.   Finally, patient has history of Vitamin D Deficiency of "12" in 2008 and last Vitamin D was still low at 30 on 04/05/2015.   Medication Sig  . albuterol (PROVENTIL HFA;VENTOLIN HFA) 108 (90 BASE) MCG/ACT inhaler Inhale 2 puffs into the lungs every 6 (six) hours as needed for wheezing or shortness of breath.  Marland Kitchen  aspirin 81 MG tablet Take 81 mg by mouth daily.    Marland Kitchen VITAMIN D 2000 UNITS  Take 4,000 Units by mouth daily.   . fenofibrate  134 MG  TAKE ONE CAPSULE BY MOUTH ONCE DAILY.  . fish oil-omega-3  1000 MG  Take 1 capsule by mouth 3 (three) times daily.   Marland Kitchen FLAX SEED OIL 1000 MG  Take 1,000 mg by mouth 3 (three) times daily.   Marland Kitchen FLONASE nasal spray Place 2 sprays into both nostrils daily.  Marland Kitchen letrozole (FEMARA) 2.5 MG Take 1 tablet (2.5 mg total) by mouth daily.  Marland Kitchen losartan  100 MG tablet TAKE ONE TABLET BY MOUTH ONCE DAILY  . Magnesium 500 MG TABS Take 500 mg by mouth 2 (two) times daily.   . metFORMIN-XR 500 MG TAKE FOUR TABLETS BY MOUTH ONCE DAILY  . naproxen 500 MG Take 1 tablet (500 mg total) by mouth 2 (two) times daily with a meal.  . SPIRIVA 18  inhalation  Place 18 mcg into inhaler and inhale daily as needed (for wheezing).    Allergies  Allergen Reactions  . Bisoprolol Other (See Comments)    Decreased Heart Rate  . Ace Inhibitors Cough  . Acyclovir And Related Rash and Other (See Comments)    Okay to take famvir  . Amoxil [Amoxicillin] Diarrhea  . Ciprofloxacin Diarrhea  . Crestor [Rosuvastatin] Other (See Comments)    Elevated LFT's and Myalgias  . Doxycycline Diarrhea  . Enalapril Cough  . Lipitor [Atorvastatin] Other (See Comments)    Elevated LFT's and Myalgias  . Metformin And Related  Diarrhea  . Prednisone Other (See Comments)    Intolerant to High Dose Prednisone  . Vicodin [Hydrocodone-Acetaminophen] Other (See Comments)    Increased Anxiety and Nervousness   Past Medical History  Diagnosis Date  . CVA (cerebral vascular accident) ([place]) 1985  . Tobacco abuse   . Allergic rhinitis   . Hypertension   . Hyperlipidemia   . Elevated hemoglobin A1c measurement   . COPD (chronic obstructive pulmonary disease) ([place])   . Lymphocytosis     right arm  . ASCVD (arteriosclerotic cardiovascular disease)   . CVA (cerebral vascular accident) ([place])   . Atherosclerosis of  aorta ([place]) 2014    via CT AB/Pelvis- no AAA  . Ovarian cyst   . SVD (spontaneous vaginal delivery)     x 2  . Shortness of breath     occasional  . Diabetes mellitus without complication ([place])   . DJD (degenerative joint disease)     neck, lower back, feet, hands  . Breast cancer ([place]) 1990    right  . Neuromuscular disorder ([place])     right arm/leg  weakness from stroke 1985  . Cancer of lower-inner quadrant of female breast ([place]) 10/26/2014  . Full dentures   . Impaired vision     macular degeneration  . Weakness of right side of body    Health Maintenance  Topic Date Due  . OPHTHALMOLOGY EXAM  04/24/1951  . ZOSTAVAX  04/23/2001  . FOOT EXAM  06/08/2015  . HEMOGLOBIN A1C  10/03/2015  . INFLUENZA VACCINE  02/20/2016  . MAMMOGRAM  10/23/2016  . TETANUS/TDAP  07/22/2017  . COLONOSCOPY  10/20/2023  . DEXA SCAN  Completed  . PNA vac Low Risk Adult  Completed   Immunization History  Administered Date(s) Administered  . Hepatitis B, ped/adol 12/17/2013, 01/17/2014, 07/19/2014  . Influenza Split 05/17/2011, 04/21/2012  . Influenza Whole 04/23/2009, 04/13/2010  . Influenza, High Dose Seasonal PF 06/07/2014, 04/05/2015  . Influenza,inj,Quad PF,36+ Mos 04/06/2013  . PPD Test 06/07/2014  . Pneumococcal Conjugate-13 06/07/2014  . Pneumococcal Polysaccharide-23 05/17/2011  . Td 07/23/2007   Past Surgical History  Procedure Laterality Date  . Mastectomy  1990    Right, no adjuvant  . Cholecystectomy  2006  . Abdominal hysterectomy  1976  . Cholecystectomy N/A 2008    Dr. Rosana Hoes  . Excision of benign salivary tumor Left 1968  . Shoulder surgery Left 2007    Dr. Percell Miller  . Back surgery  2005    lower back  . Multiple tooth extractions    . Bladder tact  1985  . Eye surgery      bilateral cataract eye surgery - now low vision  . Laparoscopic bilateral salpingo oopherectomy Bilateral 11/30/2013    Procedure: LAPAROSCOPIC RIGHT SALPINGO OOPHORECTOMY with extensive lysis of  adhesions;  Surgeon: Allena Katz, MD;  Location: [facility];  Service: Gynecology;  Laterality: Bilateral;  . Breast surgery  1990    rightmastectomy-axillary nodes  . Breast lumpectomy with radioactive seed localization Left 11/30/2014    Procedure: LEFT BREAST LUMPECTOMY WITH RADIOACTIVE SEED LOCALIZATION;  Surgeon: [name], MD;  Location: [facility];  Service: General;  Laterality: Left;   Family History  Problem Relation Age of Onset  . Emphysema Brother   . Emphysema Brother   . Emphysema Sister   . Allergies Brother     all brothers and sisters  . Asthma Sister   . Asthma Sister   . Heart disease Mother   .  Cancer Mother     breast  . Heart disease Sister   . Heart disease Brother   . Cancer Sister     breast  . Colon cancer Neg Hx    Social History  Substance Use Topics  . Smoking status: Current Every Day Smoker -- 1.00 packs/day for 55 years    Types: Cigarettes  . Smokeless tobacco: Current User     Comment: Electronic cig as well @ 0.'6mg'$  of e cig only  . Alcohol Use: No    ROS Constitutional: Denies fever, chills, weight loss/gain, headaches, insomnia,  night sweats, and change in appetite. Does c/o fatigue. Eyes: Denies redness, blurred vision, diplopia, discharge, itchy, watery eyes.  ENT: Denies discharge, congestion, post nasal drip, epistaxis, sore throat, earache, hearing loss, dental pain, Tinnitus, Vertigo, Sinus pain, snoring.  Cardio: Denies chest pain, palpitations, irregular heartbeat, syncope, dyspnea, diaphoresis, orthopnea, PND, claudication, edema Respiratory: denies cough, dyspnea, DOE, pleurisy, hoarseness, laryngitis, wheezing.  Gastrointestinal: Denies dysphagia, heartburn, reflux, water brash, pain, cramps, nausea, vomiting, bloating, diarrhea, constipation, hematemesis, melena, hematochezia, jaundice, hemorrhoids Genitourinary: Denies dysuria, frequency, urgency, nocturia, hesitancy, discharge, hematuria, flank pain Breast: Breast lumps,  nipple discharge, bleeding.  Musculoskeletal: Denies arthralgia, myalgia, stiffness, Jt. Swelling, pain, limp, and strain/sprain. Denies falls. Skin: Denies puritis, rash, hives, warts, acne, eczema, changing in skin lesion Neuro: No weakness, tremor, incoordination, spasms, paresthesia, pain Psychiatric: Denies confusion, memory loss, sensory loss. Denies Depression. Endocrine: Denies change in weight, skin, hair change, nocturia, and paresthesia, diabetic polys, visual blurring, hyper / hypo glycemic episodes.  Heme/Lymph: No excessive bleeding, bruising, enlarged lymph nodes.  Physical Exam  BP 136/80 mmHg  Pulse 72  Temp(Src) 97.3 F (36.3 C)  Resp 16  Ht '5\' 3"'$  (1.6 m)  Wt 155 lb 1.6 oz (70.353 kg)  BMI 27.48 kg/m2  General Appearance: Over nourished and in no apparent distress.  Eyes: PERRLA, EOMs, conjunctiva no swelling or erythema, pale fundi. Vision limited to hand motion but not finger counting.  Sinuses: No frontal/maxillary tenderness ENT/Mouth: EACs patent / TMs  nl. Nares clear without erythema, swelling, mucoid exudates. Oral hygiene is good. No erythema, swelling, or exudate. Tongue normal, non-obstructing. Tonsils not swollen or erythematous. Hearing normal.  Neck: Supple, thyroid normal. No bruits, nodes or JVD. Respiratory: Respiratory effort normal.  BS equal and clear bilateral without rales, rhonci, wheezing or stridor. Cardio: Heart sounds are normal with regular rate and rhythm and no murmurs, rubs or gallops. Peripheral pulses are normal and equal bilaterally without edema. No aortic or femoral bruits. Chest: symmetric with normal excursions and percussion. Breasts: Rt Breast surgically absent. Lt w/o masses or skin changes evident. Abdomen: Flat, soft, with bowel sounds. LLQ tenderness with mild guarding, but no rebound, hernias, masses, or organomegaly.  Lymphatics: Non tender without lymphadenopathy.  Musculoskeletal: Mild spastic Rt HP with mild flexion  contracturing of the R wrist/hand/fingers. Mild limp favoring the Rt.  Skin: Warm and dry without rashes, lesions, cyanosis, clubbing or  ecchymosis.  Neuro: Cranial nerves intact, DTR's in RUE - hyperreflexic and in Rt Knee but absent in the Lt & both ankles. Normal muscle tone, no cerebellar symptoms. Sensation decreased bilaterally from the proximal foot to the toes to touch, vibratory sensation and monofilament testing. Marland Kitchen  Pysch: Alert and oriented X 3, normal affect, Insight and Judgment appropriate.   Assessment and Plan  1. Annual Preventative Screening Examination  - Microalbumin / creatinine urine ratio - EKG 12-Lead - [place], RETROPERITNL ABD,  LTD - POC  Hemoccult Bld/Stl  - Urinalysis, Routine w reflex microscopic  - HM DIABETES FOOT EXAM - LOW EXTREMITY NEUR EXAM DOCUM - CBC with Differential/Platelet - BASIC METABOLIC PANEL WITH GFR - Hepatic function panel - Magnesium - Lipid panel - TSH - Hemoglobin A1c - Insulin, random - VITAMIN D 25 Hydroxy  2. Essential hypertension  - Microalbumin / creatinine urine ratio - EKG 12-Lead - [place], RETROPERITNL ABD,  LTD - TSH  3. Hyperlipidemia  - Lipid panel - TSH  4. Type 2 diabetes mellitus with stage 3 chronic kidney disease, without long-term current use of insulin (HCC)  - HM DIABETES FOOT EXAM - LOW EXTREMITY NEUR EXAM DOCUM - Hemoglobin A1c - Insulin, random  5. Vitamin D deficiency  - VITAMIN D 25 Hydroxy   6. 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 COPD mixed type ([place])   8. Cancer of lower-inner quadrant of left female breast ([place])   9. TOBACCO USER   10. DM type 2 with diabetic peripheral neuropathy ([place])   11. 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 Screening for rectal cancer  - POC Hemoccult Bld/Stl   13. Medication management  - Urinalysis, Routine w reflex microscopic  - CBC with Differential/Platelet - BASIC METABOLIC PANEL WITH GFR - Hepatic function panel - Magnesium  14. Diverticulitis of colon  -  ciprofloxacin (CIPRO) 500 MG tablet; Take 1 tablet 2 x day with food for Diverticulitis  Dispense: 20 tablet; Refill: 1 - metroNIDAZOLE (FLAGYL) 500 MG tablet; Take 1 tablet 3 x day with food for Diverticulitis  Dispense: 30 tablet; Refill: 1   Continue prudent diet as discussed, weight control, BP monitoring, regular exercise, and medications. Discussed med's effects and SE's. Screening labs and tests as requested with regular follow-up as recommended. Over 40 minutes of exam, counseling, chart review and high complex critical decision making was performed.

## 2015-08-01 NOTE — Patient Instructions (Signed)
Recommend Adult Low Dose Aspirin or   coated  Aspirin 81 mg daily   To reduce risk of Colon Cancer 20 %,   Skin Cancer 26 % ,   Melanoma 46%   and   Pancreatic cancer 60%   ++++++++++++++++++++++++++++++++++++++++++++++++++++++  Vitamin D goal   is between 70-100.   Please make sure that you are taking your Vitamin D as directed.   It is very important as a natural anti-inflammatory   helping hair, skin, and nails, as well as reducing stroke and heart attack risk.   It helps your bones and helps with mood.  It also decreases numerous cancer risks so please take it as directed.   Low Vit D is associated with a 200-300% higher risk for CANCER   and 200-300% higher risk for HEART   ATTACK  &  STROKE.   .....................................Marland Kitchen  It is also associated with higher death rate at younger ages,   autoimmune diseases like Rheumatoid arthritis, Lupus, Multiple Sclerosis.     Also many other serious conditions, like depression, Alzheimer's  Dementia, infertility, muscle aches, fatigue, fibromyalgia - just to name a few.  ++++++++++++++++++++++++++++++++++++++++++++++++  Recommend the book "The END of DIETING" by Dr Excell Seltzer   & the book "The END of DIABETES " by Dr Excell Seltzer  At [email or web address] - get book & Audio CD's     Being diabetic has a  300% increased risk for heart attack, stroke, cancer, and alzheimer- type vascular dementia. It is very important that you work harder with diet by avoiding all foods that are white. Avoid white rice (brown & wild rice is OK), white potatoes (sweetpotatoes in moderation is OK), White bread or wheat bread or anything made out of white flour like bagels, donuts, rolls, buns, biscuits, cakes, pastries, cookies, pizza crust, and pasta (made from white flour & egg whites) - vegetarian pasta or spinach or wheat pasta is OK. Multigrain breads like Arnold's or [employer], or multigrain sandwich thins or flatbreads.  Diet,  exercise and weight loss can reverse and cure diabetes in the early stages.  Diet, exercise and weight loss is very important in the control and prevention of complications of diabetes which affects every system in your body, ie. Brain - dementia/stroke, eyes - glaucoma/blindness, heart - heart attack/heart failure, kidneys - dialysis, stomach - gastric paralysis, intestines - malabsorption, nerves - severe painful neuritis, circulation - gangrene & loss of a leg(s), and finally cancer and Alzheimers.    I recommend avoid fried & greasy foods,  sweets/candy, white rice (brown or wild rice or Quinoa is OK), white potatoes (sweet potatoes are OK) - anything made from white flour - bagels, doughnuts, rolls, buns, biscuits,white and wheat breads, pizza crust and traditional pasta made of white flour & egg white(vegetarian pasta or spinach or wheat pasta is OK).  Multi-grain bread is OK - like multi-grain flat bread or sandwich thins. Avoid alcohol in excess. Exercise is also important.    Eat all the vegetables you want - avoid meat, especially red meat and dairy - especially cheese.  Cheese is the most concentrated form of trans-fats which is the worst thing to clog up our arteries. Veggie cheese is OK which can be found in the fresh produce section at [employer] or [employer] or [employer]  ++++++++++++++++++++++++++++++++++++++++++++++++++ DASH Eating Plan  DASH stands for "Dietary Approaches to Stop Hypertension."   The DASH eating plan is a healthy eating plan that has been shown to reduce high  blood pressure (hypertension). Additional health benefits may include reducing the risk of type 2 diabetes mellitus, heart disease, and stroke. The DASH eating plan may also help with weight loss.  WHAT DO I NEED TO KNOW ABOUT THE DASH EATING PLAN? For the DASH eating plan, you will follow these general guidelines:  Choose foods with a percent daily value for sodium of less than 5% (as listed on the food  label).  Use salt-free seasonings or herbs instead of table salt or sea salt.  Check with your health care provider or pharmacist before using salt substitutes.  Eat lower-sodium products, often labeled as "lower sodium" or "no salt added."  Eat fresh foods.  Eat more vegetables, fruits, and low-fat dairy products.    Choose whole grains. Look for the word "whole" as the first word in the ingredient list.  Choose fish   Limit sweets, desserts, sugars, and sugary drinks.  Choose heart-healthy fats.  Eat veggie cheese   Eat more home-cooked food and less restaurant, buffet, and fast food.  Limit fried foods.  Cook foods using methods other than frying.  Limit canned vegetables. If you do use them, rinse them well to decrease the sodium.  When eating at a restaurant, ask that your food be prepared with less salt, or no salt if possible.                      WHAT FOODS CAN I EAT?  Seek help from a dietitian for individual calorie needs.  Grains Whole grain or whole wheat bread. Brown rice. Whole grain or whole wheat pasta. Quinoa, bulgur, and whole grain cereals. Low-sodium cereals. Corn or whole wheat flour tortillas. Whole grain cornbread. Whole grain crackers. Low-sodium crackers.  Vegetables Fresh or frozen vegetables (raw, steamed, roasted, or grilled). Low-sodium or reduced-sodium tomato and vegetable juices. Low-sodium or reduced-sodium tomato sauce and paste. Low-sodium or reduced-sodium canned vegetables.   Fruits All fresh, canned (in natural juice), or frozen fruits.  Protein Products  All fish and seafood.  Dried beans, peas, or lentils. Unsalted nuts and seeds. Unsalted canned beans.  Dairy Low-fat dairy products, such as skim or 1% milk, 2% or reduced-fat cheeses, low-fat ricotta or cottage cheese, or plain low-fat yogurt. Low-sodium or reduced-sodium cheeses.  Fats and Oils Tub margarines without trans fats. Light or reduced-fat mayonnaise and salad  dressings (reduced sodium). Avocado. Safflower, olive, or canola oils. Natural peanut or almond butter.  Other Unsalted popcorn and pretzels. The items listed above may not be a complete list of recommended foods or beverages. Contact your dietitian for more options.  +++++++++++++++++++++++++++++++++++++++++++  WHAT FOODS ARE NOT RECOMMENDED?  Grains/ White flour or wheat flour  White bread. White pasta. White rice. Refined cornbread. Bagels and croissants. Crackers that contain trans fat.  Vegetables  Creamed or fried vegetables. Vegetables in a . Regular canned vegetables. Regular canned tomato sauce and paste. Regular tomato and vegetable juices.  Fruits Dried fruits. Canned fruit in light or heavy syrup. Fruit juice.  Meat and Other Protein Products Meat in general. Fatty cuts of meat. Ribs, chicken wings, bacon, sausage, bologna, salami, chitterlings, fatback, hot dogs, bratwurst, and packaged luncheon meats.  Dairy Whole or 2% milk, cream, half-and-half, and cream cheese. Whole-fat or sweetened yogurt. Full-fat cheeses or blue cheese. Nondairy creamers and whipped toppings. Processed cheese, cheese spreads, or cheese curds.  Condiments Onion and garlic salt, seasoned salt, table salt, and sea salt. Canned and packaged gravies. Worcestershire sauce. Tartar sauce.  Barbecue sauce. Teriyaki sauce. Soy sauce, including reduced sodium. Steak sauce. Fish sauce. Oyster sauce. Cocktail sauce. Horseradish. Ketchup and mustard. Meat flavorings and tenderizers. Bouillon cubes. Hot sauce. Tabasco sauce. Marinades. Taco seasonings. Relishes.  Fats and Oils Butter, stick margarine, lard, shortening, ghee, and bacon fat. Coconut, palm kernel, or palm oils. Regular salad dressings.  Pickles and olives. Salted popcorn and pretzels. The items listed above may not be a complete list of foods and beverages to avoid.   Preventive Care for Adults  A healthy lifestyle and preventive care can  promote health and wellness. Preventive health guidelines for women include the following key practices.  A routine yearly physical is a good way to check with your health care provider about your health and preventive screening. It is a chance to share any concerns and updates on your health and to receive a thorough exam.  Visit your dentist for a routine exam and preventive care every 6 months. Brush your teeth twice a day and floss once a day. Good oral hygiene prevents tooth decay and gum disease.  The frequency of eye exams is based on your age, health, family medical history, use of contact lenses, and other factors. Follow your health care provider's recommendations for frequency of eye exams.  Eat a healthy diet. Foods like vegetables, fruits, whole grains, low-fat dairy products, and lean protein foods contain the nutrients you need without too many calories. Decrease your intake of foods high in solid fats, added sugars, and salt. Eat the right amount of calories for you.Get information about a proper diet from your health care provider, if necessary.  Regular physical exercise is one of the most important things you can do for your health. Most adults should get at least 150 minutes of moderate-intensity exercise (any activity that increases your heart rate and causes you to sweat) each week. In addition, most adults need muscle-strengthening exercises on 2 or more days a week.  Maintain a healthy weight. The body mass index (BMI) is a screening tool to identify possible weight problems. It provides an estimate of body fat based on height and weight. Your health care provider can find your BMI and can help you achieve or maintain a healthy weight.For adults 20 years and older:  A BMI below 18.5 is considered underweight.  A BMI of 18.5 to 24.9 is normal.  A BMI of 25 to 29.9 is considered overweight.  A BMI of 30 and above is considered obese.  Maintain normal blood lipids and  cholesterol levels by exercising and minimizing your intake of saturated fat. Eat a balanced diet with plenty of fruit and vegetables. If your lipid or cholesterol levels are high, you are over 50, or you are at high risk for heart disease, you may need your cholesterol levels checked more frequently.Ongoing high lipid and cholesterol levels should be treated with medicines if diet and exercise are not working.  If you smoke, find out from your health care provider how to quit. If you do not use tobacco, do not start.  Lung cancer screening is recommended for adults aged 63-80 years who are at high risk for developing lung cancer because of a history of smoking. A yearly low-dose CT scan of the lungs is recommended for people who have at least a 30-pack-year history of smoking and are a current smoker or have quit within the past 15 years. A pack year of smoking is smoking an average of 1 pack of cigarettes a day for  1 year (for example: 1 pack a day for 30 years or 2 packs a day for 15 years). Yearly screening should continue until the smoker has stopped smoking for at least 15 years. Yearly screening should be stopped for people who develop a health problem that would prevent them from having lung cancer treatment.  Avoid use of street drugs. Do not share needles with anyone. Ask for help if you need support or instructions about stopping the use of drugs.  High blood pressure causes heart disease and increases the risk of stroke.  Ongoing high blood pressure should be treated with medicines if weight loss and exercise do not work.  If you are 72-14 years old, ask your health care provider if you should take aspirin to prevent strokes.  Diabetes screening involves taking a blood sample to check your fasting blood sugar level. This should be done once every 3 years, after age 59, if you are within normal weight and without risk factors for diabetes. Testing should be considered at a younger age or be  carried out more frequently if you are overweight and have at least 1 risk factor for diabetes.  Breast cancer screening is essential preventive care for women. You should practice "breast self-awareness." This means understanding the normal appearance and feel of your breasts and may include breast self-examination. Any changes detected, no matter how small, should be reported to a health care provider. Women in their 44s and 30s should have a clinical breast exam (CBE) by a health care provider as part of a regular health exam every 1 to 3 years. After age 88, women should have a CBE every year. Starting at age 7, women should consider having a mammogram (breast X-ray test) every year. Women who have a family history of breast cancer should talk to their health care provider about genetic screening. Women at a high risk of breast cancer should talk to their health care providers about having an MRI and a mammogram every year.  Breast cancer gene (BRCA)-related cancer risk assessment is recommended for women who have family members with BRCA-related cancers. BRCA-related cancers include breast, ovarian, tubal, and peritoneal cancers. Having family members with these cancers may be associated with an increased risk for harmful changes (mutations) in the breast cancer genes BRCA1 and BRCA2. Results of the assessment will determine the need for genetic counseling and BRCA1 and BRCA2 testing.  Routine pelvic exams to screen for cancer are no longer recommended for nonpregnant women who are considered low risk for cancer of the pelvic organs (ovaries, uterus, and vagina) and who do not have symptoms. Ask your health care provider if a screening pelvic exam is right for you.  If you have had past treatment for cervical cancer or a condition that could lead to cancer, you need Pap tests and screening for cancer for at least 20 years after your treatment. If Pap tests have been discontinued, your risk factors  (such as having a new sexual partner) need to be reassessed to determine if screening should be resumed. Some women have medical problems that increase the chance of getting cervical cancer. In these cases, your health care provider may recommend more frequent screening and Pap tests.    Colorectal cancer can be detected and often prevented. Most routine colorectal cancer screening begins at the age of 35 years and continues through age 53 years. However, your health care provider may recommend screening at an earlier age if you have risk factors for colon  cancer. On a yearly basis, your health care provider may provide home test kits to check for hidden blood in the stool. Use of a small camera at the end of a tube, to directly examine the colon (sigmoidoscopy or colonoscopy), can detect the earliest forms of colorectal cancer. Talk to your health care provider about this at age 49, when routine screening begins. Direct exam of the colon should be repeated every 5-10 years through age 64 years, unless early forms of pre-cancerous polyps or small growths are found.  Osteoporosis is a disease in which the bones lose minerals and strength with aging. This can result in serious bone fractures or breaks. The risk of osteoporosis can be identified using a bone density scan. Women ages 79 years and over and women at risk for fractures or osteoporosis should discuss screening with their health care providers. Ask your health care provider whether you should take a calcium supplement or vitamin D to reduce the rate of osteoporosis.  Menopause can be associated with physical symptoms and risks. Hormone replacement therapy is available to decrease symptoms and risks. You should talk to your health care provider about whether hormone replacement therapy is right for you.  Use sunscreen. Apply sunscreen liberally and repeatedly throughout the day. You should seek shade when your shadow is shorter than you. Protect  yourself by wearing long sleeves, pants, a wide-brimmed hat, and sunglasses year round, whenever you are outdoors.  Once a month, do a whole body skin exam, using a mirror to look at the skin on your back. Tell your health care provider of new moles, moles that have irregular borders, moles that are larger than a pencil eraser, or moles that have changed in shape or color.  Stay current with required vaccines (immunizations).  Influenza vaccine. All adults should be immunized every year.  Tetanus, diphtheria, and acellular pertussis (Td, Tdap) vaccine. Pregnant women should receive 1 dose of Tdap vaccine during each pregnancy. The dose should be obtained regardless of the length of time since the last dose. Immunization is preferred during the 27th-36th week of gestation. An adult who has not previously received Tdap or who does not know her vaccine status should receive 1 dose of Tdap. This initial dose should be followed by tetanus and diphtheria toxoids (Td) booster doses every 10 years. Adults with an unknown or incomplete history of completing a 3-dose immunization series with Td-containing vaccines should begin or complete a primary immunization series including a Tdap dose. Adults should receive a Td booster every 10 years.    Zoster vaccine. One dose is recommended for adults aged 77 years or older unless certain conditions are present.    Pneumococcal 13-valent conjugate (PCV13) vaccine. When indicated, a person who is uncertain of her immunization history and has no record of immunization should receive the PCV13 vaccine. An adult aged 68 years or older who has certain medical conditions and has not been previously immunized should receive 1 dose of PCV13 vaccine. This PCV13 should be followed with a dose of pneumococcal polysaccharide (PPSV23) vaccine. The PPSV23 vaccine dose should be obtained at least 8 weeks after the dose of PCV13 vaccine. An adult aged 13 years or older who has  certain medical conditions and previously received 1 or more doses of PPSV23 vaccine should receive 1 dose of PCV13. The PCV13 vaccine dose should be obtained 1 or more years after the last PPSV23 vaccine dose.    Pneumococcal polysaccharide (PPSV23) vaccine. When PCV13 is also indicated,  PCV13 should be obtained first. All adults aged 65 years and older should be immunized. An adult younger than age 65 years who has certain medical conditions should be immunized. Any person who resides in a nursing home or long-term care facility should be immunized. An adult smoker should be immunized. People with an immunocompromised condition and certain other conditions should receive both PCV13 and PPSV23 vaccines. People with human immunodeficiency virus (HIV) infection should be immunized as soon as possible after diagnosis. Immunization during chemotherapy or radiation therapy should be avoided. Routine use of PPSV23 vaccine is not recommended for American Indians, [place] Natives, or people younger than 65 years unless there are medical conditions that require PPSV23 vaccine. When indicated, people who have unknown immunization and have no record of immunization should receive PPSV23 vaccine. One-time revaccination 5 years after the first dose of PPSV23 is recommended for people aged 19-64 years who have chronic kidney failure, nephrotic syndrome, asplenia, or immunocompromised conditions. People who received 1-2 doses of PPSV23 before age 65 years should receive another dose of PPSV23 vaccine at age 65 years or later if at least 5 years have passed since the previous dose. Doses of PPSV23 are not needed for people immunized with PPSV23 at or after age 65 years.   Preventive Services / Frequency  Ages 65 years and over  Blood pressure check.  Lipid and cholesterol check.  Lung cancer screening. / Every year if you are aged 55-80 years and have a 30-pack-year history of smoking and currently smoke or have quit  within the past 15 years. Yearly screening is stopped once you have quit smoking for at least 15 years or develop a health problem that would prevent you from having lung cancer treatment.  Clinical breast exam.** / Every year after age 40 years.  BRCA-related cancer risk assessment.** / For women who have family members with a BRCA-related cancer (breast, ovarian, tubal, or peritoneal cancers).  Mammogram.** / Every year beginning at age 40 years and continuing for as long as you are in good health. Consult with your health care provider.  Pap test.** / Every 3 years starting at age 30 years through age 65 or 70 years with 3 consecutive normal Pap tests. Testing can be stopped between 65 and 70 years with 3 consecutive normal Pap tests and no abnormal Pap or HPV tests in the past 10 years.  Fecal occult blood test (FOBT) of stool. / Every year beginning at age 50 years and continuing until age 75 years. You may not need to do this test if you get a colonoscopy every 10 years.  Flexible sigmoidoscopy or colonoscopy.** / Every 5 years for a flexible sigmoidoscopy or every 10 years for a colonoscopy beginning at age 50 years and continuing until age 75 years.  Hepatitis C blood test.** / For all people born from 1945 through 1965 and any individual with known risks for hepatitis C.  Osteoporosis screening.** / A one-time screening for women ages 65 years and over and women at risk for fractures or osteoporosis.  Skin self-exam. / Monthly.  Influenza vaccine. / Every year.  Tetanus, diphtheria, and acellular pertussis (Tdap/Td) vaccine.** / 1 dose of Td every 10 years.  Zoster vaccine.** / 1 dose for adults aged 60 years or older.  Pneumococcal 13-valent conjugate (PCV13) vaccine.** / Consult your health care provider.  Pneumococcal polysaccharide (PPSV23) vaccine.** / 1 dose for all adults aged 65 years and older. Screening for abdominal aortic aneurysm (AAA)  by ultrasound   is recommended  for people who have history of high blood pressure or who are current or former smokers.

## 2015-08-02 LAB — CBC WITH DIFFERENTIAL/PLATELET
BASOS PCT: 0 % (ref 0–1)
Basophils Absolute: 0 10*3/uL (ref 0.0–0.1)
EOS ABS: 0.2 10*3/uL (ref 0.0–0.7)
EOS PCT: 2 % (ref 0–5)
HCT: 42.1 % (ref 36.0–46.0)
Hemoglobin: 14.9 g/dL (ref 12.0–15.0)
LYMPHS ABS: 5.9 10*3/uL — AB (ref 0.7–4.0)
Lymphocytes Relative: 55 % — ABNORMAL HIGH (ref 12–46)
MCH: 33.8 pg (ref 26.0–34.0)
MCHC: 35.4 g/dL (ref 30.0–36.0)
MCV: 95.5 fL (ref 78.0–100.0)
MONOS PCT: 9 % (ref 3–12)
MPV: 10.4 fL (ref 8.6–12.4)
Monocytes Absolute: 1 10*3/uL (ref 0.1–1.0)
Neutro Abs: 3.6 10*3/uL (ref 1.7–7.7)
Neutrophils Relative %: 34 % — ABNORMAL LOW (ref 43–77)
PLATELETS: 291 10*3/uL (ref 150–400)
RBC: 4.41 MIL/uL (ref 3.87–5.11)
RDW: 12.9 % (ref 11.5–15.5)
WBC: 10.7 10*3/uL — ABNORMAL HIGH (ref 4.0–10.5)

## 2015-08-02 LAB — VITAMIN D 25 HYDROXY (VIT D DEFICIENCY, FRACTURES): VIT D 25 HYDROXY: 30 ng/mL (ref 30–100)

## 2015-08-02 LAB — LIPID PANEL
CHOLESTEROL: 196 mg/dL (ref 125–200)
HDL: 39 mg/dL — AB (ref >=46)
LDL Cholesterol: 99 mg/dL (ref <130)
TRIGLYCERIDES: 288 mg/dL — AB (ref <150)
Total CHOL/HDL Ratio: 5 Ratio (ref <=5.0)
VLDL: 58 mg/dL — ABNORMAL HIGH (ref <30)

## 2015-08-02 LAB — URINALYSIS, MICROSCOPIC ONLY
Bacteria, UA: NONE SEEN [HPF]
Casts: NONE SEEN [LPF]
Crystals: NONE SEEN [HPF]
RBC / HPF: NONE SEEN RBC/HPF (ref <=2)
YEAST: NONE SEEN [HPF]

## 2015-08-02 LAB — URINALYSIS, ROUTINE W REFLEX MICROSCOPIC
Bilirubin Urine: NEGATIVE
GLUCOSE, UA: NEGATIVE
HGB URINE DIPSTICK: NEGATIVE
KETONES UR: NEGATIVE
NITRITE: NEGATIVE
PH: 6.5 (ref 5.0–8.0)
PROTEIN: NEGATIVE
Specific Gravity, Urine: 1.017 (ref 1.001–1.035)

## 2015-08-02 LAB — BASIC METABOLIC PANEL WITH GFR
BUN: 22 mg/dL (ref 7–25)
CALCIUM: 10.3 mg/dL (ref 8.6–10.4)
CO2: 22 mmol/L (ref 20–31)
CREATININE: 0.79 mg/dL (ref 0.60–0.93)
Chloride: 104 mmol/L (ref 98–110)
GFR, EST AFRICAN AMERICAN: 85 mL/min (ref >=60)
GFR, Est Non African American: 74 mL/min (ref >=60)
Glucose, Bld: 96 mg/dL (ref 65–99)
Potassium: 4.1 mmol/L (ref 3.5–5.3)
SODIUM: 138 mmol/L (ref 135–146)

## 2015-08-02 LAB — HEPATIC FUNCTION PANEL
ALBUMIN: 4.6 g/dL (ref 3.6–5.1)
ALT: 40 U/L — ABNORMAL HIGH (ref 6–29)
AST: 33 U/L (ref 10–35)
Alkaline Phosphatase: 63 U/L (ref 33–130)
BILIRUBIN TOTAL: 0.5 mg/dL (ref 0.2–1.2)
Bilirubin, Direct: 0.1 mg/dL (ref <=0.2)
Indirect Bilirubin: 0.4 mg/dL (ref 0.2–1.2)
Total Protein: 7.6 g/dL (ref 6.1–8.1)

## 2015-08-02 LAB — HEMOGLOBIN A1C
Hgb A1c MFr Bld: 6.3 % — ABNORMAL HIGH (ref <5.7)
Mean Plasma Glucose: 134 mg/dL — ABNORMAL HIGH (ref <117)

## 2015-08-02 LAB — MICROALBUMIN / CREATININE URINE RATIO
CREATININE, URINE: 84 mg/dL (ref 20–320)
MICROALB UR: 0.4 mg/dL
Microalb Creat Ratio: 5 mcg/mg creat (ref <30)

## 2015-08-02 LAB — TSH: TSH: 2.758 u[IU]/mL (ref 0.350–4.500)

## 2015-08-02 LAB — MAGNESIUM: Magnesium: 2.2 mg/dL (ref 1.5–2.5)

## 2015-08-02 LAB — INSULIN, RANDOM: INSULIN: 14.5 u[IU]/mL (ref 2.0–19.6)

## 2015-08-21 ENCOUNTER — Other Ambulatory Visit: Payer: Self-pay | Admitting: *Deleted

## 2015-08-21 DIAGNOSIS — Z1212 Encounter for screening for malignant neoplasm of rectum: Secondary | ICD-10-CM

## 2015-08-21 DIAGNOSIS — Z0001 Encounter for general adult medical examination with abnormal findings: Secondary | ICD-10-CM

## 2015-08-21 LAB — POC HEMOCCULT BLD/STL (HOME/3-CARD/SCREEN)
Card #2 Fecal Occult Blod, POC: NEGATIVE
Card #3 Fecal Occult Blood, POC: NEGATIVE
FECAL OCCULT BLD: NEGATIVE

## 2015-08-31 DIAGNOSIS — H353123 Nonexudative age-related macular degeneration, left eye, advanced atrophic without subfoveal involvement: Secondary | ICD-10-CM | POA: Diagnosis not present

## 2015-08-31 DIAGNOSIS — Z961 Presence of intraocular lens: Secondary | ICD-10-CM | POA: Diagnosis not present

## 2015-08-31 DIAGNOSIS — H353113 Nonexudative age-related macular degeneration, right eye, advanced atrophic without subfoveal involvement: Secondary | ICD-10-CM | POA: Diagnosis not present

## 2015-09-26 DIAGNOSIS — R1032 Left lower quadrant pain: Secondary | ICD-10-CM | POA: Diagnosis not present

## 2015-10-06 DIAGNOSIS — Z853 Personal history of malignant neoplasm of breast: Secondary | ICD-10-CM | POA: Diagnosis not present

## 2015-10-06 DIAGNOSIS — R1032 Left lower quadrant pain: Secondary | ICD-10-CM | POA: Diagnosis not present

## 2015-10-17 DIAGNOSIS — Z6827 Body mass index (BMI) 27.0-27.9, adult: Secondary | ICD-10-CM | POA: Diagnosis not present

## 2015-10-17 DIAGNOSIS — Z124 Encounter for screening for malignant neoplasm of cervix: Secondary | ICD-10-CM | POA: Diagnosis not present

## 2015-10-19 ENCOUNTER — Other Ambulatory Visit: Payer: Self-pay | Admitting: Obstetrics and Gynecology

## 2015-10-19 DIAGNOSIS — R1032 Left lower quadrant pain: Secondary | ICD-10-CM

## 2015-10-25 ENCOUNTER — Ambulatory Visit
Admission: RE | Admit: 2015-10-25 | Discharge: 2015-10-25 | Disposition: A | Discharge status: Home / Self Care | Payer: PPO | Source: Ambulatory Visit | Attending: Oncology | Admitting: Oncology

## 2015-10-25 DIAGNOSIS — C50312 Malignant neoplasm of lower-inner quadrant of left female breast: Secondary | ICD-10-CM

## 2015-10-25 DIAGNOSIS — R928 Other abnormal and inconclusive findings on diagnostic imaging of breast: Secondary | ICD-10-CM | POA: Diagnosis not present

## 2015-10-27 ENCOUNTER — Ambulatory Visit
Admission: RE | Admit: 2015-10-27 | Discharge: 2015-10-27 | Disposition: A | Discharge status: Home / Self Care | Payer: PPO | Source: Ambulatory Visit | Attending: Obstetrics and Gynecology | Admitting: Obstetrics and Gynecology

## 2015-10-27 DIAGNOSIS — R1032 Left lower quadrant pain: Secondary | ICD-10-CM

## 2015-10-27 DIAGNOSIS — K5730 Diverticulosis of large intestine without perforation or abscess without bleeding: Secondary | ICD-10-CM | POA: Diagnosis not present

## 2015-10-27 MED ORDER — IOPAMIDOL (ISOVUE-300) INJECTION 61%: 100.0000 mL | Freq: Once | INTRAVENOUS | Status: DC | PRN

## 2015-10-31 ENCOUNTER — Ambulatory Visit (HOSPITAL_BASED_OUTPATIENT_CLINIC_OR_DEPARTMENT_OTHER): Payer: PPO | Admitting: Oncology

## 2015-10-31 ENCOUNTER — Telehealth: Payer: Self-pay | Admitting: Oncology

## 2015-10-31 ENCOUNTER — Other Ambulatory Visit (HOSPITAL_BASED_OUTPATIENT_CLINIC_OR_DEPARTMENT_OTHER): Payer: PPO

## 2015-10-31 VITALS — BP 126/66 | HR 73 | Temp 98.1°F | Resp 19 | Ht 63.0 in | Wt 156.1 lb

## 2015-10-31 DIAGNOSIS — C50312 Malignant neoplasm of lower-inner quadrant of left female breast: Secondary | ICD-10-CM

## 2015-10-31 DIAGNOSIS — Z170 Estrogen receptor positive status [ER+]: Secondary | ICD-10-CM | POA: Diagnosis not present

## 2015-10-31 DIAGNOSIS — Z79811 Long term (current) use of aromatase inhibitors: Secondary | ICD-10-CM

## 2015-10-31 DIAGNOSIS — I700 Atherosclerosis of aorta: Secondary | ICD-10-CM | POA: Insufficient documentation

## 2015-10-31 LAB — COMPREHENSIVE METABOLIC PANEL
ALBUMIN: 3.9 g/dL (ref 3.5–5.0)
ALK PHOS: 78 U/L (ref 40–150)
ALT: 37 U/L (ref 0–55)
ANION GAP: 8 meq/L (ref 3–11)
AST: 30 U/L (ref 5–34)
BUN: 18.3 mg/dL (ref 7.0–26.0)
CALCIUM: 10.5 mg/dL — AB (ref 8.4–10.4)
CHLORIDE: 109 meq/L (ref 98–109)
CO2: 24 mEq/L (ref 22–29)
CREATININE: 0.9 mg/dL (ref 0.6–1.1)
EGFR: 60 mL/min/{1.73_m2} — ABNORMAL LOW (ref >90)
Glucose: 145 mg/dl — ABNORMAL HIGH (ref 70–140)
Potassium: 4.1 mEq/L (ref 3.5–5.1)
Sodium: 141 mEq/L (ref 136–145)
Total Bilirubin: 0.36 mg/dL (ref 0.20–1.20)
Total Protein: 7.6 g/dL (ref 6.4–8.3)

## 2015-10-31 LAB — CBC WITH DIFFERENTIAL/PLATELET
BASO%: 0.5 % (ref 0.0–2.0)
BASOS ABS: 0 10*3/uL (ref 0.0–0.1)
EOS%: 2.5 % (ref 0.0–7.0)
Eosinophils Absolute: 0.2 10*3/uL (ref 0.0–0.5)
HEMATOCRIT: 40.3 % (ref 34.8–46.6)
HEMOGLOBIN: 14.2 g/dL (ref 11.6–15.9)
LYMPH#: 5.1 10*3/uL — AB (ref 0.9–3.3)
LYMPH%: 52.9 % — ABNORMAL HIGH (ref 14.0–49.7)
MCH: 33.6 pg (ref 25.1–34.0)
MCHC: 35.3 g/dL (ref 31.5–36.0)
MCV: 95.3 fL (ref 79.5–101.0)
MONO#: 0.9 10*3/uL (ref 0.1–0.9)
MONO%: 9 % (ref 0.0–14.0)
NEUT#: 3.4 10*3/uL (ref 1.5–6.5)
NEUT%: 35.1 % — AB (ref 38.4–76.8)
PLATELETS: 277 10*3/uL (ref 145–400)
RBC: 4.23 10*6/uL (ref 3.70–5.45)
RDW: 12.7 % (ref 11.2–14.5)
WBC: 9.6 10*3/uL (ref 3.9–10.3)

## 2015-10-31 LAB — TECHNOLOGIST REVIEW

## 2015-10-31 NOTE — Telephone Encounter (Signed)
appt made per 4/11 pof.   Unable to reach pt  Calender sent via mail to [facility] pt of 2018 appt date/time

## 2015-10-31 NOTE — Progress Notes (Signed)
[place]  Telephone:([phone] Fax:([phone]) [phone]     ID: Kim WAMPOLE DOB: [birth year]  MR#: [id number]  PIR#:[id number]  Patient Care Team: Kim Pinto, MD as PCP - General (Internal Medicine) Kim Castle, MD as Consulting Physician (Gastroenterology) Kim Lever, MD as Consulting Physician (Pulmonary Disease) [name], OD (Optometry) Kim Klein, MD as Consulting Physician (General Surgery) Kim Cruel, MD as Consulting Physician (Oncology) Kim Gibson, MD as Attending Physician (Radiation Oncology) Kim Kaufmann, RN as Registered Nurse Kim Germany, RN as Registered Nurse Kim Cheese, NP as Nurse Practitioner (Nurse Practitioner) PCP: Kim Richards, MD GYN: Kim Farrier MD OTHER MD: Kim Barre MD  CHIEF COMPLAINT: Estrogen receptor positive breast cancer  CURRENT TREATMENT: Letrozole  BREAST CANCER HISTORY: From the original intake note:  "[name]" has a history of right breast cancer, status post right modified radical mastectomy in 1990. The patient tells me the tumor was "pretty day", and that they took 13 lymph nodes out. She does not know if the cancer was in the lymph nodes. She received no adjuvant therapy. She also has a history of uterine cancer in her 58s.  More recently, she had routine screening mammography September 2015 which showed a possible mass in the left breast. Diagnostic left mammography and left ultrasonography 04/11/2014 at the left breast showed an area of focal asymmetry measuring 8 mm in the left breast which was not palpable and not seen by ultrasonography.  Six-month follow-up was recommended and this was performed 10/24/2014, this time with tomography. The breast density was category C. There was an irregular subcentimeter mass in the left breast 8:00 position with coarse calcifications. This was not palpable, but this time ultrasound showed a hypoechoic left breast mass at the area  in question, measuring 0.5 cm.  Biopsy of the left breast mass 10/24/2014 showed ([id number]) an invasive ductal carcinoma, grade 1 or 2, estrogen receptor 100% positive, progesterone receptor 75% positive, both with strong staining intensity, with an MIB-1 of 11% and no HER-2 amplification, the signals ratio being 1.09 and the number per cell 1.80.  The patient's subsequent history is as detailed below  INTERVAL HISTORY: [name] returns today for follow-up of her early stagebreast cancer accompanied by her husband [name]. She continues on letrozole which she appears to be tolerating well. She does not complain of night sweats or hot flashes. Vaginal dryness is not a major complaint either. She has not developed the arthralgias and myalgias that many patients can have on this medication. She obtains it at approximately $10 a month.  REVIEW OF SYSTEMS: [name] has been having left lower quadrant pain on and off. This was treated empirically with Cipro and Flagyl with some improvement. She had a CT of the abdomen and pelvis which did not show any evidence of cancer.  Hepatic steatosis, and diverticulosis but no obvious diverticulitis. Aside from this she has some sinus symptoms including a runny nose but no significant cough. She can be short of breath with minimal activity which is not a new problem. She has back and joint pain and some difficulties walking. 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 A detailed review of systems today was otherwise stable  PAST MEDICAL HISTORY: Past Medical History  Diagnosis Date  . CVA (cerebral vascular accident) ([name]) 1985  . Tobacco abuse   . Allergic rhinitis   . Hypertension   . Hyperlipidemia   . Elevated hemoglobin A1c  measurement   . COPD (chronic obstructive pulmonary disease) ([name])   . Lymphocytosis     right arm  . ASCVD (arteriosclerotic cardiovascular disease)   . CVA (cerebral vascular accident) ([name])   .  Atherosclerosis of aorta ([name]) 2014    via CT AB/Pelvis- no AAA  . Ovarian cyst   . SVD (spontaneous vaginal delivery)     x 2  . Shortness of breath     occasional  . Diabetes mellitus without complication ([name])   . DJD (degenerative joint disease)     neck, lower back, feet, hands  . Breast cancer ([name]) 1990    right  . 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 Cancer of lower-inner quadrant of female breast ([name]) 10/26/2014  . Full dentures   . Impaired vision     macular degeneration  . Weakness of right side of body     PAST SURGICAL HISTORY: Past Surgical History  Procedure Laterality Date  . Mastectomy  1990    Right, no adjuvant  . Cholecystectomy  2006  . Abdominal hysterectomy  1976  . Cholecystectomy N/A 2008    Dr. Rosana Hoes  . Excision of benign salivary tumor Left 1968  . Shoulder surgery Left 2007    Dr. Percell Miller  . Back surgery  2005    lower back  . Multiple tooth extractions    . Bladder tact  1985  . Eye surgery      bilateral cataract eye surgery - now low vision  . Laparoscopic bilateral salpingo oopherectomy Bilateral 11/30/2013    Procedure: LAPAROSCOPIC RIGHT SALPINGO OOPHORECTOMY with extensive lysis of adhesions;  Surgeon: Allena Katz, MD;  Location: [facility];  Service: Gynecology;  Laterality: Bilateral;  . Breast surgery  1990    rightmastectomy-axillary nodes  . Breast lumpectomy with radioactive seed localization Left 11/30/2014    Procedure: LEFT BREAST LUMPECTOMY WITH RADIOACTIVE SEED LOCALIZATION;  Surgeon: Kim Klein, MD;  Location: [facility];  Service: General;  Laterality: Left;    FAMILY HISTORY Family History  Problem Relation Age of Onset  . Emphysema Brother   . Emphysema Brother   . Emphysema Sister   . Allergies Brother     all brothers and sisters  . Asthma Sister   . Asthma Sister   . Heart disease Mother   . Cancer Mother     breast  . Heart disease Sister   . Heart disease Brother     . Cancer Sister     breast  . Colon cancer Neg Hx    the patient's father died from bladder cancer the age of 52. The patient's mother died from heart disease at the age of 10. The patient had 5 brothers and 7 sisters. The patient's mother and 3 of her sisters had breast cancer. There is no history of ovarian cancer in the family  GYNECOLOGIC HISTORY:  No LMP recorded. Patient is postmenopausal. Menarche age 16, first live birth age 62. She is GX P2. She did not recall exactly when she went through menopause, but she underwent hysterectomy and unilateral salpingo-oophorectomy remotely. She never took hormone replacement. 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 She is very limited in what she can do at present. Her husband [name] is retired from Biomedical engineer. Their daughter Butch Penny lives in [place] and is a health caregiver. Son  Billy lives in [place] and works in home improvement. The patient has 5 grandchildren and 6 great-grandchildren. She is a Psychologist, forensic    ADVANCED DIRECTIVES: Not in place   HEALTH MAINTENANCE: Social History  Substance Use Topics  . Smoking status: Current Every Day Smoker -- 1.00 packs/day for 55 years    Types: Cigarettes  . Smokeless tobacco: Current User     Comment: Electronic cig as well @ 0.'6mg'$  of e cig only  . Alcohol Use: No     Colonoscopy: November 2015  PAP: Status post hysterectomy  Bone density: 06/26/2011, normal  Lipid panel:  Allergies  Allergen Reactions  . Bisoprolol Other (See Comments)    Decreased Heart Rate  . Ace Inhibitors Cough  . Acyclovir And Related Rash and Other (See Comments)    Okay to take famvir  . Amoxil [Amoxicillin] Diarrhea  . Ciprofloxacin Diarrhea  . Crestor [Rosuvastatin] Other (See Comments)    Elevated LFT's and Myalgias  . Doxycycline Diarrhea  . Enalapril Cough  . Lipitor [Atorvastatin] Other (See  Comments)    Elevated LFT's and Myalgias  . Metformin And Related Diarrhea  . Prednisone Other (See Comments)    Intolerant to High Dose Prednisone  . Vicodin [Hydrocodone-Acetaminophen] Other (See Comments)    Increased Anxiety and Nervousness    Current Outpatient Prescriptions  Medication Sig Dispense Refill  . albuterol (PROVENTIL HFA;VENTOLIN HFA) 108 (90 BASE) MCG/ACT inhaler Inhale 2 puffs into the lungs every 6 (six) hours as needed for wheezing or shortness of breath.    Marland Kitchen aspirin 81 MG tablet Take 81 mg by mouth daily.      . Cholecalciferol (VITAMIN D3) 2000 UNITS TABS Take 4,000 Units by mouth daily.     . fenofibrate micronized (LOFIBRA) 134 MG capsule TAKE ONE CAPSULE BY MOUTH ONCE DAILY. 90 capsule 1  . fish oil-omega-3 fatty acids 1000 MG capsule Take 1 capsule by mouth 3 (three) times daily.     . Flaxseed, Linseed, (FLAX SEED OIL) 1000 MG CAPS Take 1,000 mg by mouth 3 (three) times daily.     . fluticasone (FLONASE) 50 MCG/ACT nasal spray Place 2 sprays into both nostrils daily. 16 g 1  . letrozole (FEMARA) 2.5 MG tablet Take 1 tablet (2.5 mg total) by mouth daily. 90 tablet 3  . losartan (COZAAR) 100 MG tablet TAKE ONE TABLET BY MOUTH ONCE DAILY 90 tablet 1  . Magnesium 500 MG TABS Take 500 mg by mouth 2 (two) times daily.     . metFORMIN (GLUCOPHAGE-XR) 500 MG 24 hr tablet TAKE FOUR TABLETS BY MOUTH ONCE DAILY 360 tablet 1  . naproxen (NAPROSYN) 500 MG tablet Take 1 tablet (500 mg total) by mouth 2 (two) times daily with a meal. 90 tablet 3  . tiotropium (SPIRIVA) 18 MCG inhalation capsule Place 18 mcg into inhaler and inhale daily as needed (for wheezing).      No current facility-administered medications for this visit.   Facility-Administered Medications Ordered in Other Visits  Medication Dose Route Frequency Provider Last Rate Last Dose  . iopamidol (ISOVUE-300) 61 % injection 100 mL  100 mL Intravenous Once PRN Kim Farrier, MD        OBJECTIVE: Older white  woman who appears stated age 38 Vitals:   10/31/15 1458  BP: 126/66  Pulse: 73  Temp: 98.1 F (36.7 C)  Resp: 19     Body mass index is 27.66 kg/(m^2).    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 There is no evidence of local recurrence. The right axilla is benign. The left breast is unremarkable.  LAB RESULTS:  CMP     Component Value Date/Time   NA 141 10/31/2015 1449   NA 138 08/01/2015 1716   K 4.1 10/31/2015 1449   K 4.1 08/01/2015 1716   CL 104 08/01/2015 1716   CO2 24 10/31/2015 1449   CO2 22 08/01/2015 1716   GLUCOSE 145* 10/31/2015 1449   GLUCOSE 96 08/01/2015 1716   BUN 18.3 10/31/2015 1449   BUN 22 08/01/2015 1716   CREATININE 0.9 10/31/2015 1449   CREATININE 0.79 08/01/2015 1716   CREATININE 0.84 11/30/2014 0652   CALCIUM 10.5* 10/31/2015 1449   CALCIUM 10.3 08/01/2015 1716   PROT 7.6 10/31/2015 1449   PROT 7.6 08/01/2015 1716   ALBUMIN 3.9 10/31/2015 1449   ALBUMIN 4.6 08/01/2015 1716   AST 30 10/31/2015 1449   AST 33 08/01/2015 1716   ALT 37 10/31/2015 1449   ALT 40* 08/01/2015 1716   ALKPHOS 78 10/31/2015 1449   ALKPHOS 63 08/01/2015 1716   BILITOT 0.36 10/31/2015 1449   BILITOT 0.5 08/01/2015 1716   GFRNONAA 74 08/01/2015 1716   GFRNONAA >60 11/30/2014 0652   GFRAA 85 08/01/2015 1716   GFRAA >60 11/30/2014 0652    INo results found for: SPEP, UPEP  Lab Results  Component Value Date   WBC 9.6 10/31/2015   NEUTROABS 3.4 10/31/2015   HGB 14.2 10/31/2015   HCT 40.3 10/31/2015   MCV 95.3  10/31/2015   PLT 277 10/31/2015      Chemistry      Component Value Date/Time   NA 141 10/31/2015 1449   NA 138 08/01/2015 1716   K 4.1 10/31/2015 1449   K 4.1 08/01/2015 1716   CL 104 08/01/2015 1716   CO2 24 10/31/2015 1449   CO2 22 08/01/2015 1716   BUN 18.3 10/31/2015 1449   BUN 22 08/01/2015 1716   CREATININE 0.9 10/31/2015 1449   CREATININE 0.79 08/01/2015 1716   CREATININE 0.84 11/30/2014 0652      Component Value Date/Time   CALCIUM 10.5* 10/31/2015 1449   CALCIUM 10.3 08/01/2015 1716   ALKPHOS 78 10/31/2015 1449   ALKPHOS 63 08/01/2015 1716   AST 30 10/31/2015 1449   AST 33 08/01/2015 1716   ALT 37 10/31/2015 1449   ALT 40* 08/01/2015 1716   BILITOT 0.36 10/31/2015 1449   BILITOT 0.5 08/01/2015 1716       No results found for: LABCA2  No components found for: LABCA125  No results for input(s): INR in the last 168 hours.  Urinalysis    Component Value Date/Time   COLORURINE YELLOW 08/01/2015 [place] 08/01/2015 1716   LABSPEC 1.017 08/01/2015 1716   PHURINE 6.5 08/01/2015 1716   GLUCOSEU NEGATIVE 08/01/2015 1716   HGBUR NEGATIVE 08/01/2015 [place] 08/01/2015 [place] 08/01/2015 [place] 08/01/2015 1716   UROBILINOGEN 0.2 07/05/2013 1658   NITRITE NEGATIVE 08/01/2015 1716   LEUKOCYTESUR 2+* 08/01/2015 1716  STUDIES: Ct Abdomen Pelvis W Contrast  10/27/2015  CLINICAL DATA:  Three-month history of left lower quadrant abdominal pain. Creatinine was obtained on site at [facility] at [address]Results: Creatinine 0.8 mg/dL. GFR is 73. EXAM: CT ABDOMEN AND PELVIS WITH CONTRAST TECHNIQUE: Multidetector CT imaging of the abdomen and pelvis was performed using the standard protocol following bolus administration of intravenous contrast. CONTRAST:  100 cc Isovue-300 COMPARISON:  09/20/2013 FINDINGS: Lower chest: The lung bases are clear of acute process. No pleural  effusions or worrisome pulmonary lesions. The heart is normal in size. No pericardial effusion. The distal esophagus is grossly normal. Hepatobiliary: Diffuse fatty infiltration of the liver but no focal hepatic lesions or intrahepatic biliary dilatation. The portal and hepatic veins are patent. The gallbladder is surgically absent. No common bile duct dilatation. Pancreas: No mass, inflammation or ductal dilatation. Pancreatic divisum noted. Spleen: Normal size.  No focal lesions. Adrenals/Urinary Tract: The adrenal glands and kidneys are unremarkable and stable. Small nodule associated with the medial limb of the left adrenal gland is likely a small adenoma. Small midpole left renal cyst. No worrisome renal lesions or hydronephrosis. Stomach/Bowel: The stomach, duodenum, small bowel and colon are unremarkable. No inflammatory changes, mass lesions or obstructive findings. The terminal ileum is normal. The appendix is normal. Moderate sigmoid diverticulosis without findings for acute diverticulitis. Vascular/Lymphatic: Advanced atherosclerotic calcifications and tortuosity of the abdominal aorta but no focal aneurysm or dissection. The major venous structures are patent. No mesenteric or retroperitoneal mass or adenopathy. Reproductive: The uterus and right ovary are surgically absent. The left ovary is still present and appears normal. Other: The bladder is normal. No pelvic mass or adenopathy. No free pelvic fluid collections. No inguinal mass or adenopathy. No abdominal wall hernia or subcutaneous lesions. Musculoskeletal: No acute bony findings. Stable severe degenerative lumbar spondylosis with multilevel disc disease and facet disease. IMPRESSION: 1. No acute abdominal/pelvic findings, mass lesions or lymphadenopathy. 2. Sigmoid diverticulosis without findings for acute diverticulitis. 3. Status post cholecystectomy.  No biliary dilatation. 4. Diffuse fatty infiltration of liver. 5. Pancreatic divisum. 6.  Advanced atherosclerotic calcifications involving the aorta and branch vessels without focal aneurysm or dissection. Electronically Signed   By: Rudie Meyer M.D.   On: 10/27/2015 11:40   Mm Diag Breast Tomo Uni Left  10/25/2015  CLINICAL DATA:  Status post left lumpectomy for breast cancer in 2016. Previous right mastectomy for breast cancer. EXAM: 2D DIGITAL DIAGNOSTIC UNILATERAL LEFT MAMMOGRAM WITH CAD AND ADJUNCT TOMO COMPARISON:  Previous exam(s). ACR Breast Density Category c: The breast tissue is heterogeneously dense, which may obscure small masses. FINDINGS: Interval post lumpectomy changes in the posterior aspect of the lower inner quadrant of the left breast. No new findings suspicious for malignancy. Mammographic images were processed with CAD. IMPRESSION: No evidence of malignancy. RECOMMENDATION: Left diagnostic mammogram in 1 year. I have discussed the findings and recommendations with the patient. Results were also provided in writing at the conclusion of the visit. If applicable, a reminder letter will be sent to the patient regarding the next appointment. BI-RADS CATEGORY  2: Benign. Electronically Signed   By: Beckie Salts M.D.   On: 10/25/2015 13:45    ASSESSMENT: 75 y.o. [place] woman status post left breast biopsy 10/24/2014 for a clinical T1 N0 invasive ductal carcinoma, grade 1 or 2, estrogen and progesterone receptor positive, HER-2 negative, with an MIB-1 of 11%  (1) status post left lumpectomy 11/30/2014 for a pT1b pNX, stage IA invasive  ductal carcinoma, grade 1, again HER-2 negative, with negative margins  (2) anastrozole started 12/30/2014-- discontinued 03/31/2015 because of side effects  (a) exemestane prescribed 03/31/2015-- not started because of cost issues  (b) letrozole started November 2016  (c) bone density December 2012 was normal  (3) if the patient proves unable to tolerate aromatase inhibitors, she still refuses radiation  (4) status post right modified  radical mastectomy in 1994 for what was at least a stage II breast cancer, with no adjuvant therapy given  (5) status post hysterectomy with unilateral salpingo-oophorectomy for endometrial cancer in the patient's 30s, no further data available  PLAN: [name] is no almost a year out from definitive surgery for her breast cancer with no evidence of disease recurrence. This is very favorable.  She is tolerating the letrozole well. She does have aches and pains here and there but these are not symmetric and mild rather they are asymmetric and intense. This is arthritis not letrozole. She understands that and is in agreement with that. The plan accordingly is to continue letrozole for total of 10 years.  She had a normal bone density in 2012. At some point we should repeat that to make sure no further interventions are necessary. Meanwhile she continues on vitamin D 2000 units daily.  I gave her a copy of her mammogram and also a copy of her CT scan which shows no evidence of cancer.  She is going to start seeing me on a yearly basis at this point. She knows to call for any other problems that may develop before her return here.  Kim Cruel, MD   10/31/2015 3:27 PM Medical Oncology and Hematology [facility] [address] [place], [place] [address] Tel. [phone]    Fax. [phone]

## 2015-11-08 ENCOUNTER — Encounter: Payer: Self-pay | Admitting: Internal Medicine

## 2015-11-08 ENCOUNTER — Ambulatory Visit (INDEPENDENT_AMBULATORY_CARE_PROVIDER_SITE_OTHER): Payer: PPO | Admitting: Internal Medicine

## 2015-11-08 VITALS — BP 134/70 | HR 62 | Temp 98.2°F | Resp 18 | Ht 63.5 in | Wt 154.0 lb

## 2015-11-08 DIAGNOSIS — E1129 Type 2 diabetes mellitus with other diabetic kidney complication: Secondary | ICD-10-CM | POA: Diagnosis not present

## 2015-11-08 DIAGNOSIS — C50912 Malignant neoplasm of unspecified site of left female breast: Secondary | ICD-10-CM

## 2015-11-08 DIAGNOSIS — Z79899 Other long term (current) drug therapy: Secondary | ICD-10-CM | POA: Diagnosis not present

## 2015-11-08 DIAGNOSIS — I2510 Atherosclerotic heart disease of native coronary artery without angina pectoris: Secondary | ICD-10-CM | POA: Diagnosis not present

## 2015-11-08 DIAGNOSIS — H548 Legal blindness, as defined in USA: Secondary | ICD-10-CM

## 2015-11-08 DIAGNOSIS — J309 Allergic rhinitis, unspecified: Secondary | ICD-10-CM

## 2015-11-08 DIAGNOSIS — Z0001 Encounter for general adult medical examination with abnormal findings: Secondary | ICD-10-CM

## 2015-11-08 DIAGNOSIS — E1122 Type 2 diabetes mellitus with diabetic chronic kidney disease: Secondary | ICD-10-CM | POA: Diagnosis not present

## 2015-11-08 DIAGNOSIS — R6889 Other general symptoms and signs: Secondary | ICD-10-CM

## 2015-11-08 DIAGNOSIS — I700 Atherosclerosis of aorta: Secondary | ICD-10-CM

## 2015-11-08 DIAGNOSIS — J449 Chronic obstructive pulmonary disease, unspecified: Secondary | ICD-10-CM

## 2015-11-08 DIAGNOSIS — N183 Chronic kidney disease, stage 3 unspecified: Secondary | ICD-10-CM

## 2015-11-08 DIAGNOSIS — C50312 Malignant neoplasm of lower-inner quadrant of left female breast: Secondary | ICD-10-CM

## 2015-11-08 DIAGNOSIS — F17200 Nicotine dependence, unspecified, uncomplicated: Secondary | ICD-10-CM

## 2015-11-08 DIAGNOSIS — J302 Other seasonal allergic rhinitis: Secondary | ICD-10-CM

## 2015-11-08 DIAGNOSIS — E782 Mixed hyperlipidemia: Secondary | ICD-10-CM

## 2015-11-08 DIAGNOSIS — M150 Primary generalized (osteo)arthritis: Secondary | ICD-10-CM

## 2015-11-08 DIAGNOSIS — I10 Essential (primary) hypertension: Secondary | ICD-10-CM

## 2015-11-08 DIAGNOSIS — Z0000 Encounter for general adult medical examination without abnormal findings: Secondary | ICD-10-CM

## 2015-11-08 DIAGNOSIS — J3089 Other allergic rhinitis: Secondary | ICD-10-CM

## 2015-11-08 DIAGNOSIS — K5730 Diverticulosis of large intestine without perforation or abscess without bleeding: Secondary | ICD-10-CM

## 2015-11-08 DIAGNOSIS — M159 Polyosteoarthritis, unspecified: Secondary | ICD-10-CM

## 2015-11-08 DIAGNOSIS — I6789 Other cerebrovascular disease: Secondary | ICD-10-CM | POA: Diagnosis not present

## 2015-11-08 DIAGNOSIS — K7469 Other cirrhosis of liver: Secondary | ICD-10-CM

## 2015-11-08 DIAGNOSIS — E559 Vitamin D deficiency, unspecified: Secondary | ICD-10-CM

## 2015-11-08 LAB — CBC WITH DIFFERENTIAL/PLATELET
BASOS ABS: 0 {cells}/uL (ref 0–200)
BASOS PCT: 0 %
EOS ABS: 164 {cells}/uL (ref 15–500)
Eosinophils Relative: 2 %
HEMATOCRIT: 40.2 % (ref 35.0–45.0)
Hemoglobin: 14.2 g/dL (ref 11.7–15.5)
LYMPHS PCT: 55 %
Lymphs Abs: 4510 cells/uL — ABNORMAL HIGH (ref 850–3900)
MCH: 33.6 pg — AB (ref 27.0–33.0)
MCHC: 35.3 g/dL (ref 32.0–36.0)
MCV: 95 fL (ref 80.0–100.0)
MONO ABS: 820 {cells}/uL (ref 200–950)
MPV: 10 fL (ref 7.5–12.5)
Monocytes Relative: 10 %
NEUTROS PCT: 33 %
Neutro Abs: 2706 cells/uL (ref 1500–7800)
Platelets: 285 10*3/uL (ref 140–400)
RBC: 4.23 MIL/uL (ref 3.80–5.10)
RDW: 13 % (ref 11.0–15.0)
WBC: 8.2 10*3/uL (ref 3.8–10.8)

## 2015-11-08 LAB — HEPATIC FUNCTION PANEL
ALBUMIN: 4.2 g/dL (ref 3.6–5.1)
ALK PHOS: 77 U/L (ref 33–130)
ALT: 32 U/L — ABNORMAL HIGH (ref 6–29)
AST: 27 U/L (ref 10–35)
Bilirubin, Direct: 0.1 mg/dL (ref <=0.2)
Indirect Bilirubin: 0.3 mg/dL (ref 0.2–1.2)
TOTAL PROTEIN: 7.4 g/dL (ref 6.1–8.1)
Total Bilirubin: 0.4 mg/dL (ref 0.2–1.2)

## 2015-11-08 LAB — BASIC METABOLIC PANEL WITH GFR
BUN: 16 mg/dL (ref 7–25)
CO2: 23 mmol/L (ref 20–31)
CREATININE: 0.98 mg/dL — AB (ref 0.60–0.93)
Calcium: 10.4 mg/dL (ref 8.6–10.4)
Chloride: 107 mmol/L (ref 98–110)
GFR, EST AFRICAN AMERICAN: 66 mL/min (ref >=60)
GFR, Est Non African American: 57 mL/min — ABNORMAL LOW (ref >=60)
GLUCOSE: 84 mg/dL (ref 65–99)
POTASSIUM: 4.1 mmol/L (ref 3.5–5.3)
Sodium: 139 mmol/L (ref 135–146)

## 2015-11-08 LAB — TSH: TSH: 2.86 mIU/L

## 2015-11-08 LAB — LIPID PANEL
Cholesterol: 188 mg/dL (ref 125–200)
HDL: 40 mg/dL — AB (ref >=46)
LDL Cholesterol: 85 mg/dL (ref <130)
Total CHOL/HDL Ratio: 4.7 Ratio (ref <=5.0)
Triglycerides: 315 mg/dL — ABNORMAL HIGH (ref <150)
VLDL: 63 mg/dL — ABNORMAL HIGH (ref <30)

## 2015-11-08 LAB — HEMOGLOBIN A1C
HEMOGLOBIN A1C: 6.8 % — AB (ref <5.7)
MEAN PLASMA GLUCOSE: 148 mg/dL

## 2015-11-08 MED ORDER — METRONIDAZOLE 500 MG PO TABS: 500.0000 mg | ORAL_TABLET | Freq: Two times a day (BID) | ORAL | Status: DC

## 2015-11-08 MED ORDER — CIPROFLOXACIN HCL 500 MG PO TABS: 500.0000 mg | ORAL_TABLET | Freq: Two times a day (BID) | ORAL | Status: AC

## 2015-11-08 NOTE — Progress Notes (Signed)
MEDICARE ANNUAL WELLNESS VISIT AND FOLLOW UP  Assessment:    1. Essential hypertension -well controlled today -cont meds -diet and exercise -recommended quitting smoking - TSH  2. C V A / STROKE -followed by cards -cont daily asa -recommended quitting smoking  3. ASCVD (arteriosclerotic cardiovascular disease) -followed by cards -cont daily asa -recommended quitting smoking  4. Aortic atherosclerosis (HCC) -cont asa -cont cholesterol meds -quit smoking  5. Seasonal and perennial allergic rhinitis -followed by pulmonology  6. COPD mixed type [facility]) -followed by pulmonology  7. Diverticulosis of colon without hemorrhage -follow with scheduled colonoscopy -cipro and flagyl given for possible flare  8. Other cirrhosis of liver ([place]) -monitor LFTs -avoid tylenol and alcohol  9. Type 2 diabetes mellitus with stage 3 chronic kidney disease, without long-term current use of insulin (HCC) -cont diet and exercise -cont meds - Hemoglobin A1c  10. Primary osteoarthritis involving multiple joints -exercise -avoid tylenol for pain management due to liver  11. Hyperlipidemia -cont meds - Lipid panel  12. TOBACCO USER -not ready to quit smoking  13. Malignant neoplasm of left female breast, unspecified site of breast ([place]) -followed by oncology  14. Medication management  - CBC with Differential/Platelet - BASIC METABOLIC PANEL WITH GFR - Hepatic function panel  15. Vitamin D deficiency -cont supplement  16. Cancer of lower-inner quadrant of left female breast [facility]) -followed by oncology  17. 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 Sabra Heck    Over 30 minutes of exam, counseling, chart review, and critical decision making was performed  Plan:   During the course of the visit the patient was educated and counseled about appropriate screening and preventive services including:    Pneumococcal vaccine   Influenza vaccine  Td vaccine  Prevnar  13  Screening electrocardiogram  Screening mammography  Bone densitometry screening  Colorectal cancer screening  Diabetes screening  Glaucoma screening  Nutrition counseling   Advanced directives: given info/requested copies  Conditions/risks identified: Diabetes is at goal, ACE/ARB therapy: Yes. Urinary Incontinence is not an issue: discussed non pharmacology and pharmacology options.  Fall risk: low- discussed PT, home fall assessment, medications.    Subjective:   [name] is a 75 y.o. female who presents for Medicare Annual Wellness Visit and 3 month follow up on hypertension, prediabetes, hyperlipidemia, vitamin D def.  Date of last medicare wellness visit is unknown.   Her blood pressure has been controlled at home, today their BP is BP: 134/70 mmHg She does workout. She denies chest pain, shortness of breath, dizziness.  She is on cholesterol medication and denies myalgias. Her cholesterol is at goal. The cholesterol last visit was:   Lab Results  Component Value Date   CHOL 196 08/01/2015   HDL 39* 08/01/2015   LDLCALC 99 08/01/2015   TRIG 288* 08/01/2015   CHOLHDL 5.0 08/01/2015   She has been working on diet and exercise for prediabetes, and denies foot ulcerations, hyperglycemia, hypoglycemia , increased appetite, nausea, paresthesia of the feet, polydipsia, polyuria, visual disturbances, vomiting and weight loss. Last A1C in the office was:  Lab Results  Component Value Date   HGBA1C 6.3* 08/01/2015   Last GFR Lab Results  Component Value Date   GFRNONAA 74 08/01/2015   Lab Results  Component Value Date   GFRAA 85 08/01/2015   Patient is on Vitamin D supplement. Lab Results  Component Value Date   VD25OH 30 08/01/2015     She went to see Dr. Gaetano Net a week  or two ago and was told that she was having a diverticulitis flare.  She reports that she was told that she could have her left ovary out by laparoscopic surgery.  She is thinking about  it.    She has not quit smoking.  She is not committed to the idea of stopping at this time.  She smokes a 1/2 pack per day.    Medication Review Current Outpatient Prescriptions on File Prior to Visit  Medication Sig Dispense Refill  . albuterol (PROVENTIL HFA;VENTOLIN HFA) 108 (90 BASE) MCG/ACT inhaler Inhale 2 puffs into the lungs every 6 (six) hours as needed for wheezing or shortness of breath.    Marland Kitchen aspirin 81 MG tablet Take 81 mg by mouth daily.      . Cholecalciferol (VITAMIN D3) 2000 UNITS TABS Take 4,000 Units by mouth daily.     . fenofibrate micronized (LOFIBRA) 134 MG capsule TAKE ONE CAPSULE BY MOUTH ONCE DAILY. 90 capsule 1  . fish oil-omega-3 fatty acids 1000 MG capsule Take 1 capsule by mouth 3 (three) times daily.     . Flaxseed, Linseed, (FLAX SEED OIL) 1000 MG CAPS Take 1,000 mg by mouth 3 (three) times daily.     . fluticasone (FLONASE) 50 MCG/ACT nasal spray Place 2 sprays into both nostrils daily. 16 g 1  . letrozole (FEMARA) 2.5 MG tablet Take 1 tablet (2.5 mg total) by mouth daily. 90 tablet 3  . losartan (COZAAR) 100 MG tablet TAKE ONE TABLET BY MOUTH ONCE DAILY 90 tablet 1  . Magnesium 500 MG TABS Take 500 mg by mouth 2 (two) times daily.     . metFORMIN (GLUCOPHAGE-XR) 500 MG 24 hr tablet TAKE FOUR TABLETS BY MOUTH ONCE DAILY 360 tablet 1  . naproxen (NAPROSYN) 500 MG tablet Take 1 tablet (500 mg total) by mouth 2 (two) times daily with a meal. 90 tablet 3  . tiotropium (SPIRIVA) 18 MCG inhalation capsule Place 18 mcg into inhaler and inhale daily as needed (for wheezing).      No current facility-administered medications on file prior to visit.    Current Problems (verified) Patient Active Problem List   Diagnosis Date Noted  . Aortic atherosclerosis ([place]) 10/31/2015  . 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 Cancer of lower-inner quadrant of left female breast ([place]) 10/26/2014  . 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 Medication management  11/23/2013  . Vitamin D deficiency 11/23/2013  . Hepatic cirrhosis ([place]) 10/01/2013  . Diverticulosis of colon without hemorrhage 08/24/2013  . Breast cancer ([place])   . DJD (degenerative joint disease)   . ASCVD (arteriosclerotic cardiovascular disease)   . Seasonal and perennial allergic rhinitis 01/18/2010  . Hyperlipidemia 01/12/2010  . TOBACCO USER 01/12/2010  . Essential hypertension 01/12/2010  . C V A / STROKE 01/12/2010  . COPD mixed type ([place]) 01/12/2010    Screening Tests Immunization History  Administered Date(s) Administered  . Hepatitis B, ped/adol 12/17/2013, 01/17/2014, 07/19/2014  . Influenza Split 05/17/2011, 04/21/2012  . Influenza Whole 04/23/2009, 04/13/2010  . Influenza, High Dose Seasonal PF 06/07/2014, 04/05/2015  . Influenza,inj,Quad PF,36+ Mos 04/06/2013  . PPD Test 06/07/2014  . Pneumococcal Conjugate-13 06/07/2014  . Pneumococcal Polysaccharide-23 05/17/2011  . Td 07/23/2007    Preventative care: Last colonoscopy: 2015 Last mammogram: 10/25/15 Last pap smear/pelvic exam: 10/25/15  [id number]  Prior vaccinations: TD or Tdap: 2009  Influenza: 2016 Pneumococcal: 2012 Prevnar13: 2015 Shingles/Zostavax: Declined  Names of Other Physician/Practitioners you currently use:  1. [facility]- here for primary care 2. Dr. Sabra Heck, eye doctor, last visit 2016 3. Dentures, dentist, last visit not seeing one Patient Care Team: [name], MD as PCP - General (Internal Medicine) Inda Castle, MD as Consulting Physician (Gastroenterology) Deneise Lever, MD as Consulting Physician (Pulmonary Disease) Marica Otter, OD (Optometry) [name], MD as Consulting Physician (General Surgery) Chauncey Cruel, MD as Consulting Physician (Oncology) Eppie Gibson, MD as Attending Physician (Radiation Oncology) Mauro Kaufmann, RN as Registered Nurse Rockwell Germany, RN as Registered Nurse Sylvan Cheese, NP as  Nurse Practitioner (Nurse Practitioner)  Past Surgical History  Procedure Laterality Date  . Mastectomy  1990    Right, no adjuvant  . Cholecystectomy  2006  . Abdominal hysterectomy  1976  . Cholecystectomy N/A 2008    Dr. Rosana Hoes  . Excision of benign salivary tumor Left 1968  . Shoulder surgery Left 2007    Dr. Percell Miller  . Back surgery  2005    lower back  . Multiple tooth extractions    . Bladder tact  1985  . Eye surgery      bilateral cataract eye surgery - now low vision  . Laparoscopic bilateral salpingo oopherectomy Bilateral 11/30/2013    Procedure: LAPAROSCOPIC RIGHT SALPINGO OOPHORECTOMY with extensive lysis of adhesions;  Surgeon: Allena Katz, MD;  Location: [facility];  Service: Gynecology;  Laterality: Bilateral;  . Breast surgery  1990    rightmastectomy-axillary nodes  . Breast lumpectomy with radioactive seed localization Left 11/30/2014    Procedure: LEFT BREAST LUMPECTOMY WITH RADIOACTIVE SEED LOCALIZATION;  Surgeon: [name], MD;  Location: [facility];  Service: General;  Laterality: Left;   Family History  Problem Relation Age of Onset  . Emphysema Brother   . Emphysema Brother   . Emphysema Sister   . Allergies Brother     all brothers and sisters  . Asthma Sister   . Asthma Sister   . Heart disease Mother   . Cancer Mother     breast  . Heart disease Sister   . Heart disease Brother   . Cancer Sister     breast  . Colon cancer Neg Hx    Social History  Substance Use Topics  . Smoking status: Current Every Day Smoker -- 1.00 packs/day for 55 years    Types: Cigarettes  . Smokeless tobacco: Current User     Comment: Electronic cig as well @ 0.'6mg'$  of e cig only  . Alcohol Use: No   Allergies  Allergen Reactions  . Bisoprolol Other (See Comments)    Decreased Heart Rate  . Ace Inhibitors Cough  . Acyclovir And Related Rash and Other (See Comments)    Okay to take famvir  . Amoxil [Amoxicillin] Diarrhea  . Ciprofloxacin Diarrhea  . Crestor  [Rosuvastatin] Other (See Comments)    Elevated LFT's and Myalgias  . Doxycycline Diarrhea  . Enalapril Cough  . Lipitor [Atorvastatin] Other (See Comments)    Elevated LFT's and Myalgias  . Metformin And Related Diarrhea  . Prednisone Other (See Comments)    Intolerant to High Dose Prednisone  . Vicodin [Hydrocodone-Acetaminophen] Other (See Comments)    Increased Anxiety and Nervousness    MEDICARE WELLNESS OBJECTIVES: Tobacco use: She does smoke.  Patient is a former smoker. If yes, counseling given Alcohol Current alcohol use: none Diet: well balanced Physical activity: Current Exercise Habits: Home exercise routine, Type of exercise: walking (yard work),  Time (Minutes): 20, Intensity: Mild Cardiac risk factors: Cardiac Risk Factors include: advanced age (>69mn, >>54women);family history of premature cardiovascular disease;hypertension;diabetes mellitus;dyslipidemia;obesity (BMI >30kg/m2);sedentary lifestyle;smoking/ tobacco exposure Depression/mood screen:   [facility] 11/08/2015  Decreased Interest 0  Down, Depressed, Hopeless 0  PHQ - 2 Score 0    ADLs:  In your present state of health, do you have any difficulty performing the following activities: 11/08/2015 08/01/2015  Hearing? N N  Vision? Y N  Difficulty concentrating or making decisions? N N  Walking or climbing stairs? N N  Dressing or bathing? N N  Doing errands, shopping? N N  Preparing Food and eating ? N -  Using the Toilet? N -  In the past six months, have you accidently leaked urine? N -  Do you have problems with loss of bowel control? N -  Managing your Medications? Y -  Managing your Finances? N -  Housekeeping or managing your Housekeeping? N -     Cognitive Testing  Alert? Yes  Normal Appearance?Yes  Oriented to person? Yes  Place? Yes   Time? Yes  Recall of three objects?  Yes  Can perform simple calculations? Yes  Displays appropriate judgment?Yes  Can read the correct time  from a watch face?Yes  EOL planning: Does patient have an advance directive?: No Would patient like information on creating an advanced directive?: Yes - Educational materials given   Objective:   Today's Vitals   11/08/15 1433  BP: 134/70  Pulse: 62  Temp: 98.2 F (36.8 C)  TempSrc: Temporal  Resp: 18  Height: 5' 3.5" (1.613 m)  Weight: 154 lb (69.854 kg)   Body mass index is 26.85 kg/(m^2).  General appearance: alert, no distress, WD/WN,  female HEENT: normocephalic, sclerae anicteric, TMs pearly, nares patent, no discharge or erythema, pharynx normal Oral cavity: MMM, no lesions Neck: supple, no lymphadenopathy, no thyromegaly, no masses Heart: RRR, normal S1, S2, no murmurs Lungs: CTA bilaterally, no wheezes, rhonchi, or rales Abdomen: +bs, soft, non tender, non distended, no masses, no hepatomegaly, no splenomegaly Musculoskeletal: nontender, no swelling, no obvious deformity Extremities: no edema, no cyanosis, no clubbing Pulses: 2+ symmetric, upper and lower extremities, normal cap refill Neurological: alert, oriented x 3, CN2-12 intact, strength normal upper extremities and lower extremities, sensation normal throughout, DTRs 2+ throughout, no cerebellar signs, gait normal Psychiatric: normal affect, behavior normal, pleasant  Breast: defer Gyn: defer Rectal: defer   Medicare Attestation I have personally reviewed: The patient's medical and social history Their use of alcohol, tobacco or illicit drugs Their current medications and supplements The patient's functional ability including ADLs,fall risks, home safety risks, cognitive, and hearing and visual impairment Diet and physical activities Evidence for depression or mood disorders  The patient's weight, height, BMI, and visual acuity have been recorded in the chart.  I have made referrals, counseling, and provided education to the patient based on review of the above and I have provided the patient with a  written personalized care plan for preventive services.     CStarlyn Skeans PA-C   11/08/2015

## 2016-01-04 ENCOUNTER — Ambulatory Visit: Payer: Medicare HMO | Admitting: Internal Medicine

## 2016-01-25 ENCOUNTER — Other Ambulatory Visit: Payer: Self-pay | Admitting: Internal Medicine

## 2016-02-13 ENCOUNTER — Encounter: Payer: Self-pay | Admitting: Internal Medicine

## 2016-02-13 ENCOUNTER — Ambulatory Visit (INDEPENDENT_AMBULATORY_CARE_PROVIDER_SITE_OTHER): Payer: PPO | Admitting: Internal Medicine

## 2016-02-13 VITALS — BP 128/62 | HR 60 | Temp 97.3°F | Resp 16 | Ht 63.5 in | Wt 156.2 lb

## 2016-02-13 DIAGNOSIS — E559 Vitamin D deficiency, unspecified: Secondary | ICD-10-CM | POA: Diagnosis not present

## 2016-02-13 DIAGNOSIS — I10 Essential (primary) hypertension: Secondary | ICD-10-CM

## 2016-02-13 DIAGNOSIS — E782 Mixed hyperlipidemia: Secondary | ICD-10-CM

## 2016-02-13 DIAGNOSIS — E2839 Other primary ovarian failure: Secondary | ICD-10-CM | POA: Diagnosis not present

## 2016-02-13 DIAGNOSIS — N183 Chronic kidney disease, stage 3 (moderate): Secondary | ICD-10-CM

## 2016-02-13 DIAGNOSIS — E1122 Type 2 diabetes mellitus with diabetic chronic kidney disease: Secondary | ICD-10-CM | POA: Diagnosis not present

## 2016-02-13 DIAGNOSIS — Z79899 Other long term (current) drug therapy: Secondary | ICD-10-CM

## 2016-02-13 LAB — CBC WITH DIFFERENTIAL/PLATELET
BASOS ABS: 0 {cells}/uL (ref 0–200)
Basophils Relative: 0 %
EOS ABS: 154 {cells}/uL (ref 15–500)
EOS PCT: 2 %
HCT: 40.7 % (ref 35.0–45.0)
Hemoglobin: 14.3 g/dL (ref 11.7–15.5)
LYMPHS PCT: 59 %
Lymphs Abs: 4543 cells/uL — ABNORMAL HIGH (ref 850–3900)
MCH: 32.3 pg (ref 27.0–33.0)
MCHC: 35.1 g/dL (ref 32.0–36.0)
MCV: 91.9 fL (ref 80.0–100.0)
MONOS PCT: 9 %
MPV: 10.2 fL (ref 7.5–12.5)
Monocytes Absolute: 693 cells/uL (ref 200–950)
NEUTROS PCT: 30 %
Neutro Abs: 2310 cells/uL (ref 1500–7800)
PLATELETS: 277 10*3/uL (ref 140–400)
RBC: 4.43 MIL/uL (ref 3.80–5.10)
RDW: 12.9 % (ref 11.0–15.0)
WBC: 7.7 10*3/uL (ref 3.8–10.8)

## 2016-02-13 LAB — BASIC METABOLIC PANEL WITH GFR
BUN: 15 mg/dL (ref 7–25)
CALCIUM: 10.4 mg/dL (ref 8.6–10.4)
CO2: 21 mmol/L (ref 20–31)
CREATININE: 0.8 mg/dL (ref 0.60–0.93)
Chloride: 102 mmol/L (ref 98–110)
GFR, Est African American: 84 mL/min (ref >=60)
GFR, Est Non African American: 73 mL/min (ref >=60)
Glucose, Bld: 139 mg/dL — ABNORMAL HIGH (ref 65–99)
Potassium: 4.1 mmol/L (ref 3.5–5.3)
SODIUM: 135 mmol/L (ref 135–146)

## 2016-02-13 LAB — HEPATIC FUNCTION PANEL
ALBUMIN: 4.4 g/dL (ref 3.6–5.1)
ALT: 47 U/L — AB (ref 6–29)
AST: 36 U/L — AB (ref 10–35)
Alkaline Phosphatase: 68 U/L (ref 33–130)
BILIRUBIN DIRECT: 0.1 mg/dL (ref <=0.2)
BILIRUBIN TOTAL: 0.5 mg/dL (ref 0.2–1.2)
Indirect Bilirubin: 0.4 mg/dL (ref 0.2–1.2)
Total Protein: 7.2 g/dL (ref 6.1–8.1)

## 2016-02-13 LAB — LIPID PANEL
CHOL/HDL RATIO: 4.6 ratio (ref <=5.0)
CHOLESTEROL: 178 mg/dL (ref 125–200)
HDL: 39 mg/dL — ABNORMAL LOW (ref >=46)
LDL Cholesterol: 85 mg/dL (ref <130)
Triglycerides: 269 mg/dL — ABNORMAL HIGH (ref <150)
VLDL: 54 mg/dL — ABNORMAL HIGH (ref <30)

## 2016-02-13 LAB — MAGNESIUM: MAGNESIUM: 2.2 mg/dL (ref 1.5–2.5)

## 2016-02-13 LAB — TSH: TSH: 2.6 m[IU]/L

## 2016-02-13 NOTE — Patient Instructions (Signed)

## 2016-02-13 NOTE — Progress Notes (Signed)
[name]                       [name], M.D.        [name]. [name], P.A.-C       [name], P.A.-C   [facility]                [address] [place] [address] Telephone [phone] Telefax [phone] ______________________________________________________________________     This very nice 75 y.o. MWF presents for 6 month follow up with Hypertension, Hyperlipidemia, T2_DM and Vitamin D Deficiency. Patient is s/p Rt Mastectomy (1990) and Lt lumpectomy in 2016 & is followed by Dr [name].      Patient is treated for HTN circa 1991 & BP has been controlled at home. Today's BP: 128/62. Patient has had no complaints of any cardiac type chest pain, palpitations, dyspnea/orthopnea/PND, dizziness, claudication, or dependent edema.    Hyperlipidemia is controlled with diet & meds. Patient denies myalgias or other med SE's. Last Lipids were at goal with elevated Triglycerides.  Lab Results  Component Value Date   CHOL 188 11/08/2015   HDL 40 (L) 11/08/2015   LDLCALC 85 11/08/2015   TRIG 315 (H) 11/08/2015   CHOLHDL 4.7 11/08/2015      Also, the patient has history of PreDM with A1c 5.9% since 2009 and then T2_NIDDM circa 2012 with A1c 6.8% and has had no symptoms of reactive hypoglycemia, diabetic polys, paresthesias or visual blurring.  Last A1c was not at goal attributed to poor dietary compliance:  Lab Results  Component Value Date   HGBA1C 6.8 (H) 11/08/2015      Further, the patient also has history of Vitamin D Deficiency of "12" in 2009 and supplements vitamin D without any suspected side-effects. Last vitamin D was still very low: Lab Results  Component Value Date   VD25OH 30 08/01/2015   Current Outpatient Prescriptions on File Prior to Visit  Medication Sig  . albuterol (PROVENTIL HFA;VENTOLIN HFA) 108 (90 BASE) MCG/ACT inhaler Inhale 2 puffs into the lungs every  6 (six) hours as needed for wheezing or shortness of breath.  Marland Kitchen aspirin 81 MG tablet Take 81 mg by mouth daily.    . Cholecalciferol (VITAMIN D3) 2000 UNITS TABS Take 4,000 Units by mouth daily.   . fenofibrate micronized (LOFIBRA) 134 MG capsule TAKE ONE CAPSULE BY MOUTH ONCE DAILY  . fish oil-omega-3 fatty acids 1000 MG capsule Take 1 capsule by mouth 3 (three) times daily.   . Flaxseed, Linseed, (FLAX SEED OIL) 1000 MG CAPS Take 1,000 mg by mouth 3 (three) times daily.   . fluticasone (FLONASE) 50 MCG/ACT nasal spray Place 2 sprays into both nostrils daily.  Marland Kitchen letrozole (FEMARA) 2.5 MG tablet Take 1 tablet (2.5 mg total) by mouth daily.  Marland Kitchen losartan (COZAAR) 100 MG tablet TAKE ONE TABLET BY MOUTH ONCE DAILY  . Magnesium 500 MG TABS Take 500 mg by mouth 2 (two) times daily.   . metFORMIN (GLUCOPHAGE-XR) 500 MG 24 hr tablet TAKE FOUR TABLETS BY MOUTH ONCE DAILY  . naproxen (NAPROSYN) 500 MG tablet Take 1 tablet (500 mg total) by mouth 2 (two) times daily with a meal.  . tiotropium (SPIRIVA) 18 MCG inhalation capsule Place 18 mcg into inhaler and inhale daily as needed (for wheezing).    No current facility-administered  medications on file prior to visit.      Allergies  Allergen Reactions  . Bisoprolol Other (See Comments)    Decreased Heart Rate  . Ace Inhibitors Cough  . Acyclovir And Related Rash and Other (See Comments)    Okay to take famvir  . Amoxil [Amoxicillin] Diarrhea  . Ciprofloxacin Diarrhea  . Crestor [Rosuvastatin] Other (See Comments)    Elevated LFT's and Myalgias  . Doxycycline Diarrhea  . Enalapril Cough  . Lipitor [Atorvastatin] Other (See Comments)    Elevated LFT's and Myalgias  . Metformin And Related Diarrhea  . Prednisone Other (See Comments)    Intolerant to High Dose Prednisone  . Vicodin [Hydrocodone-Acetaminophen] Other (See Comments)    Increased Anxiety and Nervousness    PMHx:   Past Medical History:  Diagnosis Date  . Allergic rhinitis   .  ASCVD (arteriosclerotic cardiovascular disease)   . Atherosclerosis of aorta ([place]) 2014   via CT AB/Pelvis- no AAA  . Breast cancer ([place]) 1990   right  . Cancer of lower-inner quadrant of female breast ([place]) 10/26/2014  . COPD (chronic obstructive pulmonary disease) ([place])   . CVA (cerebral vascular accident) ([place]) 1985  . CVA (cerebral vascular accident) ([place])   . Diabetes mellitus without complication ([place])   . DJD (degenerative joint disease)    neck, lower back, feet, hands  . Elevated hemoglobin A1c measurement   . Full dentures   . Hyperlipidemia   . Hypertension   . Impaired vision    macular degeneration  . Lymphocytosis    right arm  . 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 Ovarian cyst   . Shortness of breath    occasional  . SVD (spontaneous vaginal delivery)    x 2  . Tobacco abuse   . Weakness of right side of body    Immunization History  Administered Date(s) Administered  . Hepatitis B, ped/adol 12/17/2013, 01/17/2014, 07/19/2014  . Influenza Split 05/17/2011, 04/21/2012  . Influenza Whole 04/23/2009, 04/13/2010  . Influenza, High Dose Seasonal PF 06/07/2014, 04/05/2015  . Influenza,inj,Quad PF,36+ Mos 04/06/2013  . PPD Test 06/07/2014  . Pneumococcal Conjugate-13 06/07/2014  . Pneumococcal Polysaccharide-23 05/17/2011  . Td 07/23/2007   Past Surgical History:  Procedure Laterality Date  . ABDOMINAL HYSTERECTOMY  1976  . BACK SURGERY  2005   lower back  . bladder tact  1985  . BREAST LUMPECTOMY WITH RADIOACTIVE SEED LOCALIZATION Left 11/30/2014   Procedure: LEFT BREAST LUMPECTOMY WITH RADIOACTIVE SEED LOCALIZATION;  Surgeon: [name], MD;  Location: [facility];  Service: General;  Laterality: Left;  . BREAST SURGERY  1990   rightmastectomy-axillary nodes  . CHOLECYSTECTOMY  2006  . CHOLECYSTECTOMY N/A 2008   Dr. Rosana Hoes  . excision of benign salivary tumor Left 1968  . EYE SURGERY     bilateral cataract eye surgery  - now low vision  . LAPAROSCOPIC BILATERAL SALPINGO OOPHERECTOMY Bilateral 11/30/2013   Procedure: LAPAROSCOPIC RIGHT SALPINGO OOPHORECTOMY with extensive lysis of adhesions;  Surgeon: Allena Katz, MD;  Location: [facility];  Service: Gynecology;  Laterality: Bilateral;  . MASTECTOMY  1990   Right, no adjuvant  . MULTIPLE TOOTH EXTRACTIONS    . SHOULDER SURGERY Left 2007   Dr. Percell Miller   FHx:    Reviewed / unchanged  SHx:    Reviewed / unchanged  Systems Review:  Constitutional: Denies fever, chills, wt changes, headaches, insomnia, fatigue, night sweats, change  in appetite. Eyes: Denies redness, blurred vision, diplopia, discharge, itchy, watery eyes.  ENT: Denies discharge, congestion, post nasal drip, epistaxis, sore throat, earache, hearing loss, dental pain, tinnitus, vertigo, sinus pain, snoring.  CV: Denies chest pain, palpitations, irregular heartbeat, syncope, dyspnea, diaphoresis, orthopnea, PND, claudication or edema. Respiratory: denies cough, dyspnea, DOE, pleurisy, hoarseness, laryngitis, wheezing.  Gastrointestinal: Denies dysphagia, odynophagia, heartburn, reflux, water brash, abdominal pain or cramps, nausea, vomiting, bloating, diarrhea, constipation, hematemesis, melena, hematochezia  or hemorrhoids. Genitourinary: Denies dysuria, frequency, urgency, nocturia, hesitancy, discharge, hematuria or flank pain. Musculoskeletal: Denies arthralgias, myalgias, stiffness, jt. swelling, pain, limping or strain/sprain.  Skin: Denies pruritus, rash, hives, warts, acne, eczema or change in skin lesion(s). Neuro: No weakness, tremor, incoordination, spasms, paresthesia or pain. Psychiatric: Denies confusion, memory loss or sensory loss. Endo: Denies change in weight, skin or hair change.  Heme/Lymph: No excessive bleeding, bruising or enlarged lymph nodes.  Physical Exam  BP 128/62   Pulse 60   Temp 97.3 F (36.3 C)   Resp 16   Ht 5' 3.5" (1.613 m)   Wt 156 lb 3.2 oz (70.9  kg)   BMI 27.24 kg/m   Appears over nourished and in no distress.  Eyes: PERRLA, EOMs, conjunctiva no swelling or erythema. Sinuses: No frontal/maxillary tenderness ENT/Mouth: EAC's clear, TM's nl w/o erythema, bulging. Nares clear w/o erythema, swelling, exudates. Oropharynx clear without erythema or exudates. Oral hygiene is good. Tongue normal, non obstructing. Hearing intact.  Neck: Supple. Thyroid nl. Car 2+/2+ without bruits, nodes or JVD. Chest: Respirations nl with BS clear & equal w/o rales, rhonchi, wheezing or stridor.  Cor: Heart sounds normal w/ regular rate and rhythm without sig. murmurs, gallops, clicks, or rubs. Peripheral pulses normal and equal  without edema.  Abdomen: Soft & bowel sounds normal. Non-tender w/o guarding, rebound, hernias, masses, or organomegaly.  Lymphatics: Unremarkable.  Musculoskeletal: Full ROM all peripheral extremities, joint stability, 5/5 strength, and normal gait.  Skin: Warm, dry without exposed rashes, lesions or ecchymosis apparent.  Neuro: Cranial nerves intact, reflexes equal bilaterally. Sensory-motor testing grossly intact. Tendon reflexes grossly intact.  Pysch: Alert & oriented x 3.  Insight and judgement nl & appropriate. No ideations.  Assessment and Plan:  1. Essential hypertension  - Continue medication, monitor blood pressure at home. Continue DASH diet. Reminder to go to the ER if any CP, SOB, nausea, dizziness, severe HA, changes vision/speech, left arm numbness and tingling and jaw pain.  - TSH  2. Hyperlipidemia  - Continue diet/meds, exercise,& lifestyle modifications. Continue monitor periodic cholesterol/liver & renal functions  - Lipid panel - TSH  3. Type 2 diabetes mellitus with stage 3 chronic kidney disease, without long-term current use of insulin (HCC)  - Continue diet, exercise, lifestyle modifications. Monitor appropriate labs. - Hemoglobin A1c - Insulin, random  4. Vitamin D deficiency  - Continue  supplementation. - VITAMIN D 25 Hydroxy   5. Medication management  - CBC with Differential/Platelet - BASIC METABOLIC PANEL WITH GFR - Hepatic function panel - Magnesium   Recommended regular exercise, BP monitoring, weight control, and discussed med and SE's. Recommended labs to assess and monitor clinical status. Further disposition pending results of labs. Over 30 minutes of exam, counseling, chart review was performed

## 2016-02-14 LAB — HEMOGLOBIN A1C
HEMOGLOBIN A1C: 6.9 % — AB (ref <5.7)
Mean Plasma Glucose: 151 mg/dL

## 2016-02-14 LAB — INSULIN, RANDOM: INSULIN: 57.5 u[IU]/mL — AB (ref 2.0–19.6)

## 2016-02-14 LAB — VITAMIN D 25 HYDROXY (VIT D DEFICIENCY, FRACTURES): VIT D 25 HYDROXY: 33 ng/mL (ref 30–100)

## 2016-02-27 ENCOUNTER — Other Ambulatory Visit: Payer: Self-pay | Admitting: Internal Medicine

## 2016-02-27 ENCOUNTER — Ambulatory Visit
Admission: RE | Admit: 2016-02-27 | Discharge: 2016-02-27 | Disposition: A | Discharge status: Home / Self Care | Payer: PPO | Source: Ambulatory Visit | Attending: Internal Medicine | Admitting: Internal Medicine

## 2016-02-27 DIAGNOSIS — E2839 Other primary ovarian failure: Secondary | ICD-10-CM

## 2016-02-27 DIAGNOSIS — Z780 Asymptomatic menopausal state: Secondary | ICD-10-CM | POA: Diagnosis not present

## 2016-02-27 DIAGNOSIS — M810 Age-related osteoporosis without current pathological fracture: Secondary | ICD-10-CM | POA: Diagnosis not present

## 2016-02-27 MED ORDER — ALENDRONATE SODIUM 70 MG PO TABS: 70.0000 mg | ORAL_TABLET | ORAL | 3 refills | Status: DC

## 2016-04-28 ENCOUNTER — Other Ambulatory Visit: Payer: Self-pay | Admitting: Internal Medicine

## 2016-05-07 ENCOUNTER — Ambulatory Visit (INDEPENDENT_AMBULATORY_CARE_PROVIDER_SITE_OTHER): Payer: PPO | Admitting: Internal Medicine

## 2016-05-07 ENCOUNTER — Encounter: Payer: Self-pay | Admitting: Internal Medicine

## 2016-05-07 ENCOUNTER — Ambulatory Visit (INDEPENDENT_AMBULATORY_CARE_PROVIDER_SITE_OTHER)
Admission: RE | Admit: 2016-05-07 | Discharge: 2016-05-07 | Disposition: A | Discharge status: Home / Self Care | Payer: PPO | Source: Ambulatory Visit | Attending: Internal Medicine | Admitting: Internal Medicine

## 2016-05-07 VITALS — BP 144/76 | HR 62 | Ht 63.0 in | Wt 155.2 lb

## 2016-05-07 DIAGNOSIS — Z23 Encounter for immunization: Secondary | ICD-10-CM

## 2016-05-07 DIAGNOSIS — J302 Other seasonal allergic rhinitis: Secondary | ICD-10-CM

## 2016-05-07 DIAGNOSIS — J449 Chronic obstructive pulmonary disease, unspecified: Secondary | ICD-10-CM

## 2016-05-07 DIAGNOSIS — F17200 Nicotine dependence, unspecified, uncomplicated: Secondary | ICD-10-CM

## 2016-05-07 DIAGNOSIS — R05 Cough: Secondary | ICD-10-CM | POA: Diagnosis not present

## 2016-05-07 DIAGNOSIS — J3089 Other allergic rhinitis: Secondary | ICD-10-CM

## 2016-05-07 MED ORDER — PREDNISONE 10 MG PO TABS: ORAL_TABLET | ORAL | 0 refills | Status: DC

## 2016-05-07 MED ORDER — CEFDINIR 300 MG PO CAPS: 300.0000 mg | ORAL_CAPSULE | Freq: Two times a day (BID) | ORAL | 0 refills | Status: DC

## 2016-05-07 NOTE — Patient Instructions (Addendum)
Fluvax  Order- CXR  Dx COPD mixed type, hx breast Ca  Please try hard to quit smoking. 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  Script sent for cefdinir antibiotic for sinusitis and bronchitis  Script sent for prednisone taper to hold

## 2016-05-07 NOTE — Progress Notes (Signed)
Patient ID: [name], female    DOB: 03-02-1941, 75 y.o.   MRN: [id number]  HPI F  smoker returning for f/u of COPD and allergic rhinitis. PFT 2011 showed mild obstruction w/ severe reduction DLCO.   08/25/14- 37 yoF 1/2 PPD smoker returning for f/u of COPD and allergic rhinitis, complicated by history of CVA, breast cancer/ lymphedema FOLLOWS FOR: Recent caught cold(last week) and is making her breathing worse. Has had Zpak and OTC meds. Also, tried to take Prednisone and was unable to do so-nervous during the day and stays awake at night. Finished Z-Pak last week for chest congestion. Easy shortness of breath with exertion, cough productive yellow but no fever or blood. No effort to stop smoking but using E cigarettes more now. 5 or 6 tobacco cigarettes daily. Pro air and Spiriva are usually enough.  07/06/2015-75 year old female one half pack per day smoker followed for COPD, allergic rhinitis, complicated by history CVA, breast cancer/lymphedema Follows for: COPD. Pt c/o cough with yellow mucus, wheeze and SOB for a week. Pt denies CP/tightness.  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 Mild sense of chest congestion and increased sputum without fever, sore throat, blood. Little change in smoking pattern-few cigarettes daily despite our counseling efforts.  05/07/2016-75 year old female chronic smoker followed for COPD, allergic rhinitis, complicated by history CVA, breast CVA/lymphedema FOLLOW FOR:  Wheezing a lot, sinus pressure, sinus drainage, couging up yellow mucus. Chronic sinus congestion and postnasal drainage. More rhinorrhea in the past week with little sneezing. Has had radioactive seeds left breast, right mastectomy without reconstructio CXR 12/16/2016n IMPRESSION: 1. No acute cardiopulmonary disease. 2. Right mastectomy. Electronically Signed   By: Marcello Moores  Register   On: 07/07/2015 08:11   Review of Systems-See HPI Constitutional:    No-   weight loss, night sweats, fevers, chills, fatigue, lassitude. HEENT:   No-  headaches, difficulty swallowing, tooth/dental problems, sore throat,       No-  sneezing, itching, ear ache, +nasal congestion, +post nasal drip,  CV:  No-   chest pain, orthopnea, PND, swelling in lower extremities, anasarca, dizziness, palpitations Resp: +  shortness of breath with exertion or at rest.             +- productive cough,  + non-productive cough,  No- coughing up of blood.             No-change in color of mucus.  No- wheezing.   Skin: No-   rash or lesions. GI:  No-   heartburn, indigestion, abdominal pain, nausea, vomiting, GU: . MS:  No-   joint pain or swelling.   Neuro-     nothing unusual Psych:  No- change in mood or affect. No depression or anxiety.  No memory loss.  Objective:   Physical Exam  General- Alert, Oriented, Affect-appropriate, Distress- none acute, +odor of tobacco Skin- rash-none, lesions- none, excoriation- none Lymphadenopathy- none Head- atraumatic            Eyes- Gross vision intact, PERRLA, conjunctivae clear secretions            Ears- Hearing, canals-normal            Nose- +turbinate edema, no-Septal dev, mucus, polyps, erosion, perforation             Throat- Mallampati III , mucosa clear , drainage- none, tonsils- atrophic, dentures Neck- flexible , trachea midline, no stridor , thyroid nl, carotid no bruit Chest - symmetrical excursion , unlabored  Heart/CV- RRR , no murmur , no gallop  , no rub, nl s1 s2                           - JVD- none , edema- none, stasis changes- none, varices- none           Lung- +diminished breath sounds, wheeze-none, unlabored, +Loose cough, dullness-none, rub- none           Chest wall-  Abd-  Br/ Gen/ Rectal- Not done, not indicated Extrem- cyanosis- none, clubbing, none, atrophy- none, strength- nl. +Right  arm remains in the elastic sleeve. Neuro- + gaze to right

## 2016-05-08 NOTE — Assessment & Plan Note (Signed)
Some exacerbation of bronchitis and rhinosinusitis coincident with season change. Plan-prednisone taper, Cefdinir, flu vaccine

## 2016-05-08 NOTE — Assessment & Plan Note (Signed)
Allergic and irritants/tobacco smoke rhinitis Plan-Flonase, saline nasal spray, watch effect of cefdinir and prednisone taper fr bronchitis

## 2016-05-08 NOTE — Assessment & Plan Note (Signed)
I again emphasized negative effect of smoking on her medical problems and offered cessation support and encouragement

## 2016-05-21 ENCOUNTER — Encounter: Payer: Self-pay | Admitting: Physician Assistant

## 2016-05-21 ENCOUNTER — Ambulatory Visit (INDEPENDENT_AMBULATORY_CARE_PROVIDER_SITE_OTHER): Payer: PPO | Admitting: Physician Assistant

## 2016-05-21 ENCOUNTER — Ambulatory Visit: Payer: Self-pay | Admitting: Physician Assistant

## 2016-05-21 VITALS — BP 140/80 | HR 66 | Temp 97.5°F | Resp 14 | Ht 63.5 in | Wt 154.0 lb

## 2016-05-21 DIAGNOSIS — K7469 Other cirrhosis of liver: Secondary | ICD-10-CM

## 2016-05-21 DIAGNOSIS — M25511 Pain in right shoulder: Secondary | ICD-10-CM | POA: Diagnosis not present

## 2016-05-21 DIAGNOSIS — I10 Essential (primary) hypertension: Secondary | ICD-10-CM | POA: Diagnosis not present

## 2016-05-21 DIAGNOSIS — C50912 Malignant neoplasm of unspecified site of left female breast: Secondary | ICD-10-CM | POA: Diagnosis not present

## 2016-05-21 DIAGNOSIS — E1122 Type 2 diabetes mellitus with diabetic chronic kidney disease: Secondary | ICD-10-CM

## 2016-05-21 DIAGNOSIS — E559 Vitamin D deficiency, unspecified: Secondary | ICD-10-CM

## 2016-05-21 DIAGNOSIS — M25512 Pain in left shoulder: Secondary | ICD-10-CM

## 2016-05-21 DIAGNOSIS — I6789 Other cerebrovascular disease: Secondary | ICD-10-CM

## 2016-05-21 DIAGNOSIS — I700 Atherosclerosis of aorta: Secondary | ICD-10-CM

## 2016-05-21 DIAGNOSIS — E782 Mixed hyperlipidemia: Secondary | ICD-10-CM

## 2016-05-21 DIAGNOSIS — J449 Chronic obstructive pulmonary disease, unspecified: Secondary | ICD-10-CM

## 2016-05-21 DIAGNOSIS — N183 Chronic kidney disease, stage 3 (moderate): Secondary | ICD-10-CM | POA: Diagnosis not present

## 2016-05-21 DIAGNOSIS — Z79899 Other long term (current) drug therapy: Secondary | ICD-10-CM

## 2016-05-21 LAB — BASIC METABOLIC PANEL WITH GFR
BUN: 15 mg/dL (ref 7–25)
CO2: 22 mmol/L (ref 20–31)
CREATININE: 1.03 mg/dL — AB (ref 0.60–0.93)
Calcium: 10.2 mg/dL (ref 8.6–10.4)
Chloride: 104 mmol/L (ref 98–110)
GFR, EST NON AFRICAN AMERICAN: 53 mL/min — AB (ref >=60)
GFR, Est African American: 61 mL/min (ref >=60)
Glucose, Bld: 110 mg/dL — ABNORMAL HIGH (ref 65–99)
POTASSIUM: 4 mmol/L (ref 3.5–5.3)
Sodium: 137 mmol/L (ref 135–146)

## 2016-05-21 LAB — CBC WITH DIFFERENTIAL/PLATELET
BASOS PCT: 0 %
Basophils Absolute: 0 cells/uL (ref 0–200)
EOS PCT: 2 %
Eosinophils Absolute: 138 cells/uL (ref 15–500)
HCT: 40.6 % (ref 35.0–45.0)
Hemoglobin: 14.5 g/dL (ref 11.7–15.5)
LYMPHS PCT: 60 %
Lymphs Abs: 4140 cells/uL — ABNORMAL HIGH (ref 850–3900)
MCH: 33 pg (ref 27.0–33.0)
MCHC: 35.7 g/dL (ref 32.0–36.0)
MCV: 92.3 fL (ref 80.0–100.0)
MONOS PCT: 8 %
MPV: 10.2 fL (ref 7.5–12.5)
Monocytes Absolute: 552 cells/uL (ref 200–950)
Neutro Abs: 2070 cells/uL (ref 1500–7800)
Neutrophils Relative %: 30 %
PLATELETS: 252 10*3/uL (ref 140–400)
RBC: 4.4 MIL/uL (ref 3.80–5.10)
RDW: 13 % (ref 11.0–15.0)
WBC: 6.9 10*3/uL (ref 3.8–10.8)

## 2016-05-21 LAB — LIPID PANEL
CHOL/HDL RATIO: 4.9 ratio (ref <=5.0)
Cholesterol: 172 mg/dL (ref 125–200)
HDL: 35 mg/dL — ABNORMAL LOW (ref >=46)
LDL Cholesterol: 78 mg/dL (ref <130)
Triglycerides: 294 mg/dL — ABNORMAL HIGH (ref <150)
VLDL: 59 mg/dL — ABNORMAL HIGH (ref <30)

## 2016-05-21 LAB — HEPATIC FUNCTION PANEL
ALT: 59 U/L — AB (ref 6–29)
AST: 52 U/L — AB (ref 10–35)
Albumin: 4.3 g/dL (ref 3.6–5.1)
Alkaline Phosphatase: 56 U/L (ref 33–130)
BILIRUBIN DIRECT: 0.1 mg/dL (ref <=0.2)
Indirect Bilirubin: 0.4 mg/dL (ref 0.2–1.2)
TOTAL PROTEIN: 7.3 g/dL (ref 6.1–8.1)
Total Bilirubin: 0.5 mg/dL (ref 0.2–1.2)

## 2016-05-21 LAB — TSH: TSH: 2.84 m[IU]/L

## 2016-05-21 NOTE — Patient Instructions (Signed)
Can take the lyrica samples for nerve pain. It can make you sleepy so we suggest trying it at night first and please plan to not drive or do anything strenuous. Also please do not take this medication with alcohol.    Start out 1 pill at night before bed, can increase to 2 pills at night before bed. Please call the office if you have any side effects.   Can take 3 pills a day however you would like  Some examples: - 1 breakfast, lunch, bedtime. - 1 at breakfast, 2 at bed time  If you do well with it we will switch you to gabapentin that you need to take 1-3 hours before bed.    Vitamin D goal is between 60-80  Please make sure that you are taking your Vitamin D as directed.   It is very important as a natural anti-inflammatory   helping hair, skin, and nails, as well as reducing stroke and heart attack risk.   It helps your bones and helps with mood.  It also decreases numerous cancer risks so please take it as directed.   Low Vit D is associated with a 200-300% higher risk for CANCER   and 200-300% higher risk for HEART   ATTACK  &  STROKE.    .....................................Marland Kitchen  It is also associated with higher death rate at younger ages,   autoimmune diseases like Rheumatoid arthritis, Lupus, Multiple Sclerosis.     Also many other serious conditions, like depression, Alzheimer's  Dementia, infertility, muscle aches, fatigue, fibromyalgia - just to name a few.  +++++++++++++++++++  Can get liquid vitamin D from [facility] here in [place] at  [facility] [address] [place], [place] [address]   How should I use this medicine? Take this medicine by mouth with a glass of water. Follow the directions on the prescription label. You can take this medicine with or without food. Take your doses at regular intervals. Do not take your medicine more often than directed. Do not stop taking except on your doctor's advice.  What if I miss a dose? If you miss a  dose, take it as soon as you can. If it is almost time for your next dose, take only that dose. Do not take double or extra doses.  What should I watch for while using this medicine? Tell your doctor or healthcare professional if your symptoms do not start to get better or if they get worse.   You may get drowsy or dizzy. Do not drive, use machinery, or do anything that needs mental alertness until you know how this medicine affects you. Do not stand or sit up quickly, especially if you are an older patient. This reduces the risk of dizzy or fainting spells. Alcohol may interfere with the effect of this medicine. Avoid alcoholic drinks. If you have a heart condition, like congestive heart failure, and notice that you are retaining water and have swelling in your hands or feet, contact your health care provider immediately.  What side effects may I notice from receiving this medicine? Side effects that you should report to your doctor or health care professional as soon as possible and are very rare: -allergic reactions like skin rash, itching or hives, swelling of the face, lips, or tongue -breathing problems -changes in vision -jerking or unusual movements of any part of your body -suicidal thoughts or other mood changes -swelling of the ankles, feet, hands -unusual bruising or bleeding  Side effects that  usually do not require medical attention (Report these to your doctor or health care professional if they continue or are bothersome.): -dizziness -drowsiness -dry mouth -nausea -tremors

## 2016-05-21 NOTE — Progress Notes (Signed)
3 MONTH FOLLOW UP  Assessment:    Essential hypertension - continue medications, DASH diet, exercise and monitor at home. Call if greater than 130/80.   C V A / STROKE Control blood pressure, cholesterol, glucose, increase exercise.   Aortic atherosclerosis (HCC) Control blood pressure, cholesterol, glucose, increase exercise.   COPD mixed type ([place]) -follows pulmonary, advised to quit smoking  Other cirrhosis of liver ([place]) -monitor LFTs -avoid tylenol and alcohol  Type 2 diabetes mellitus with stage 3 chronic kidney disease, without long-term current use of insulin (HCC) -cont diet and exercise -cont meds - Hemoglobin A1c   Hyperlipidemia -cont meds - Lipid panel  TOBACCO USER -not ready to quit smoking   Medication management - CBC with Differential/Platelet - BASIC METABOLIC PANEL WITH GFR - Hepatic function panel  Vitamin D deficiency -cont supplement  Cancer of lower-inner quadrant of left female breast ([place]) -followed by oncology  Joint pain Chest ESR since larger joints rule out PMR Continue aleve, will add on lyrica samples and will switch to gabapentin if helps, if not may try nortriptyline or Cymbalta.   Over 30 minutes of exam, counseling, chart review, and critical decision making was performed    Subjective:   [name] is a 75 y.o. female who presents f 3 month follow up on hypertension, prediabetes, hyperlipidemia, vitamin D def.   Her blood pressure has been controlled at home, today their BP is BP: 140/80 She does workout. She denies chest pain, shortness of breath, dizziness.  She is on cholesterol medication and denies myalgias. Her cholesterol is at goal. The cholesterol last visit was:   Lab Results  Component Value Date   CHOL 178 02/13/2016   HDL 39 (L) 02/13/2016   LDLCALC 85 02/13/2016   TRIG 269 (H) 02/13/2016   CHOLHDL 4.6 02/13/2016   She has been working on diet and exercise for diabetes with CKD, increased MF to 2  pills a day, and denies foot ulcerations, hyperglycemia, hypoglycemia , increased appetite, nausea, paresthesia of the feet, polydipsia, polyuria, visual disturbances, vomiting and weight loss. Last A1C in the office was:  Lab Results  Component Value Date   HGBA1C 6.9 (H) 02/13/2016   Last GFR Lab Results  Component Value Date   GFRNONAA 73 02/13/2016   Patient is on Vitamin D supplement. Lab Results  Component Value Date   VD25OH 33 02/13/2016     Lab Results  Component Value Date   ALT 47 (H) 02/13/2016   AST 36 (H) 02/13/2016   ALKPHOS 68 02/13/2016   BILITOT 0.5 02/13/2016     Medication Review Current Outpatient Prescriptions on File Prior to Visit  Medication Sig Dispense Refill  . albuterol (PROVENTIL HFA;VENTOLIN HFA) 108 (90 BASE) MCG/ACT inhaler Inhale 2 puffs into the lungs every 6 (six) hours as needed for wheezing or shortness of breath.    Marland Kitchen alendronate (FOSAMAX) 70 MG tablet Take 1 tablet (70 mg total) by mouth every 7 (seven) days. Take with a full glass of water on an empty stomach. 12 tablet 3  . aspirin 81 MG tablet Take 81 mg by mouth daily.      . Cholecalciferol (VITAMIN D3) 2000 UNITS TABS Take 4,000 Units by mouth daily.     . fenofibrate micronized (LOFIBRA) 134 MG capsule TAKE ONE CAPSULE BY MOUTH ONCE DAILY 90 capsule 0  . fish oil-omega-3 fatty acids 1000 MG capsule Take 1 capsule by mouth 3 (three) times daily.     . Flaxseed,  Linseed, (FLAX SEED OIL) 1000 MG CAPS Take 1,000 mg by mouth 3 (three) times daily.     . fluticasone (FLONASE) 50 MCG/ACT nasal spray Place 2 sprays into both nostrils daily. 16 g 1  . letrozole (FEMARA) 2.5 MG tablet Take 1 tablet (2.5 mg total) by mouth daily. 90 tablet 3  . losartan (COZAAR) 100 MG tablet TAKE ONE TABLET BY MOUTH ONCE DAILY 90 tablet 1  . Magnesium 500 MG TABS Take 500 mg by mouth 2 (two) times daily.     . metFORMIN (GLUCOPHAGE-XR) 500 MG 24 hr tablet TAKE FOUR TABLETS BY MOUTH ONCE DAILY 360 tablet 1  .  tiotropium (SPIRIVA) 18 MCG inhalation capsule Place 18 mcg into inhaler and inhale daily as needed (for wheezing).      No current facility-administered medications on file prior to visit.     Current Problems (verified) Patient Active Problem List   Diagnosis Date Noted  . Aortic atherosclerosis (HCC) 10/31/2015  . 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 Cancer of lower-inner quadrant of left female breast (HCC) 10/26/2014  . 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 Medication management 11/23/2013  . Vitamin D deficiency 11/23/2013  . Hepatic cirrhosis (HCC) 10/01/2013  . Diverticulosis of colon without hemorrhage 08/24/2013  . Breast cancer (HCC)   . DJD (degenerative joint disease)   . ASCVD (arteriosclerotic cardiovascular disease)   . Seasonal and perennial allergic rhinitis 01/18/2010  . Hyperlipidemia 01/12/2010  . TOBACCO USER 01/12/2010  . Essential hypertension 01/12/2010  . C V A / STROKE 01/12/2010  . COPD mixed type (HCC) 01/12/2010     Past Surgical History:  Procedure Laterality Date  . ABDOMINAL HYSTERECTOMY  1976  . BACK SURGERY  2005   lower back  . bladder tact  1985  . BREAST LUMPECTOMY WITH RADIOACTIVE SEED LOCALIZATION Left 11/30/2014   Procedure: LEFT BREAST LUMPECTOMY WITH RADIOACTIVE SEED LOCALIZATION;  Surgeon: Almond Lint, MD;  Location: [facility] OR;  Service: General;  Laterality: Left;  . BREAST SURGERY  1990   rightmastectomy-axillary nodes  . CHOLECYSTECTOMY  2006  . CHOLECYSTECTOMY N/A 2008   Dr. Earlene Plater  . excision of benign salivary tumor Left 1968  . EYE SURGERY     bilateral cataract eye surgery - now low vision  . LAPAROSCOPIC BILATERAL SALPINGO OOPHERECTOMY Bilateral 11/30/2013   Procedure: LAPAROSCOPIC RIGHT SALPINGO OOPHORECTOMY with extensive lysis of adhesions;  Surgeon: Leslie Andrea, MD;  Location: [facility] ORS;  Service: Gynecology;  Laterality: Bilateral;  . MASTECTOMY  1990   Right, no adjuvant  . MULTIPLE TOOTH  EXTRACTIONS    . SHOULDER SURGERY Left 2007   Dr. Eulah Pont   ROS   Objective:   Today's Vitals   05/21/16 1415  BP: 140/80  Pulse: 66  Resp: 14  Temp: 97.5 F (36.4 C)  SpO2: 95%  Weight: 154 lb (69.9 kg)  Height: 5' 3.5" (1.613 m)   Body mass index is 26.85 kg/m.  General appearance: alert, no distress, WD/WN,  female HEENT: normocephalic, sclerae anicteric, TMs pearly, nares patent, no discharge or erythema, pharynx normal Oral cavity: MMM, no lesions Neck: supple, no lymphadenopathy, no thyromegaly, no masses Heart: RRR, normal S1, S2, no murmurs Lungs: CTA bilaterally, no wheezes, rhonchi, or rales Abdomen: +bs, soft, non tender, non distended, no masses, no hepatomegaly, no splenomegaly Musculoskeletal: nontender, no swelling, no obvious deformity Extremities: no edema, no cyanosis, no clubbing Pulses: 2+ symmetric, upper and lower extremities, normal cap refill  Neurological: alert, oriented x 3, CN2-12 intact, strength normal upper extremities and lower extremities, sensation normal throughout, DTRs 2+ throughout, no cerebellar signs, gait normal Psychiatric: normal affect, behavior normal, pleasant     Vicie Mutters, PA-C   05/21/2016

## 2016-05-22 LAB — MAGNESIUM: Magnesium: 2.2 mg/dL (ref 1.5–2.5)

## 2016-05-22 LAB — VITAMIN D 25 HYDROXY (VIT D DEFICIENCY, FRACTURES): Vit D, 25-Hydroxy: 28 ng/mL — ABNORMAL LOW (ref 30–100)

## 2016-05-22 LAB — HEMOGLOBIN A1C
HEMOGLOBIN A1C: 6 % — AB (ref <5.7)
Mean Plasma Glucose: 126 mg/dL

## 2016-05-22 LAB — SEDIMENTATION RATE: SED RATE: 16 mm/h (ref 0–30)

## 2016-05-28 ENCOUNTER — Ambulatory Visit: Payer: Self-pay | Admitting: Physician Assistant

## 2016-06-01 ENCOUNTER — Other Ambulatory Visit: Payer: Self-pay | Admitting: Internal Medicine

## 2016-07-29 ENCOUNTER — Encounter: Payer: Self-pay | Admitting: Internal Medicine

## 2016-08-05 ENCOUNTER — Encounter: Payer: Self-pay | Admitting: Internal Medicine

## 2016-08-22 ENCOUNTER — Ambulatory Visit (INDEPENDENT_AMBULATORY_CARE_PROVIDER_SITE_OTHER): Payer: PPO | Admitting: Internal Medicine

## 2016-08-22 ENCOUNTER — Encounter: Payer: Self-pay | Admitting: Internal Medicine

## 2016-08-22 VITALS — BP 132/68 | HR 64 | Temp 97.2°F | Resp 16 | Ht 62.5 in | Wt 155.8 lb

## 2016-08-22 DIAGNOSIS — E559 Vitamin D deficiency, unspecified: Secondary | ICD-10-CM

## 2016-08-22 DIAGNOSIS — E782 Mixed hyperlipidemia: Secondary | ICD-10-CM

## 2016-08-22 DIAGNOSIS — Z79899 Other long term (current) drug therapy: Secondary | ICD-10-CM

## 2016-08-22 DIAGNOSIS — Z136 Encounter for screening for cardiovascular disorders: Secondary | ICD-10-CM

## 2016-08-22 DIAGNOSIS — I10 Essential (primary) hypertension: Secondary | ICD-10-CM | POA: Diagnosis not present

## 2016-08-22 DIAGNOSIS — E1122 Type 2 diabetes mellitus with diabetic chronic kidney disease: Secondary | ICD-10-CM | POA: Diagnosis not present

## 2016-08-22 DIAGNOSIS — R6889 Other general symptoms and signs: Secondary | ICD-10-CM

## 2016-08-22 DIAGNOSIS — Z0001 Encounter for general adult medical examination with abnormal findings: Secondary | ICD-10-CM

## 2016-08-22 DIAGNOSIS — E1142 Type 2 diabetes mellitus with diabetic polyneuropathy: Secondary | ICD-10-CM | POA: Diagnosis not present

## 2016-08-22 DIAGNOSIS — N183 Chronic kidney disease, stage 3 unspecified: Secondary | ICD-10-CM

## 2016-08-22 DIAGNOSIS — I6789 Other cerebrovascular disease: Secondary | ICD-10-CM

## 2016-08-22 DIAGNOSIS — I2510 Atherosclerotic heart disease of native coronary artery without angina pectoris: Secondary | ICD-10-CM

## 2016-08-22 DIAGNOSIS — Z1212 Encounter for screening for malignant neoplasm of rectum: Secondary | ICD-10-CM

## 2016-08-22 LAB — CBC WITH DIFFERENTIAL/PLATELET
BASOS ABS: 0 {cells}/uL (ref 0–200)
BASOS PCT: 0 %
EOS ABS: 134 {cells}/uL (ref 15–500)
Eosinophils Relative: 2 %
HCT: 40.9 % (ref 35.0–45.0)
Hemoglobin: 14.2 g/dL (ref 11.7–15.5)
LYMPHS PCT: 69 %
Lymphs Abs: 4623 cells/uL — ABNORMAL HIGH (ref 850–3900)
MCH: 32.6 pg (ref 27.0–33.0)
MCHC: 34.7 g/dL (ref 32.0–36.0)
MCV: 93.8 fL (ref 80.0–100.0)
MONOS PCT: 8 %
MPV: 10.4 fL (ref 7.5–12.5)
Monocytes Absolute: 536 cells/uL (ref 200–950)
Neutro Abs: 1407 cells/uL — ABNORMAL LOW (ref 1500–7800)
Neutrophils Relative %: 21 %
PLATELETS: 221 10*3/uL (ref 140–400)
RBC: 4.36 MIL/uL (ref 3.80–5.10)
RDW: 13.3 % (ref 11.0–15.0)
WBC: 6.7 10*3/uL (ref 3.8–10.8)

## 2016-08-22 LAB — URINALYSIS, ROUTINE W REFLEX MICROSCOPIC
Bilirubin Urine: NEGATIVE
Glucose, UA: NEGATIVE
HGB URINE DIPSTICK: NEGATIVE
KETONES UR: NEGATIVE
NITRITE: NEGATIVE
PH: 7 (ref 5.0–8.0)
Protein, ur: NEGATIVE
Specific Gravity, Urine: 1.01 (ref 1.001–1.035)

## 2016-08-22 LAB — HEPATIC FUNCTION PANEL
ALBUMIN: 4.4 g/dL (ref 3.6–5.1)
ALK PHOS: 67 U/L (ref 33–130)
ALT: 55 U/L — AB (ref 6–29)
AST: 44 U/L — AB (ref 10–35)
BILIRUBIN INDIRECT: 0.5 mg/dL (ref 0.2–1.2)
Bilirubin, Direct: 0.1 mg/dL (ref <=0.2)
TOTAL PROTEIN: 7.7 g/dL (ref 6.1–8.1)
Total Bilirubin: 0.6 mg/dL (ref 0.2–1.2)

## 2016-08-22 LAB — BASIC METABOLIC PANEL WITH GFR
BUN: 14 mg/dL (ref 7–25)
CHLORIDE: 105 mmol/L (ref 98–110)
CO2: 23 mmol/L (ref 20–31)
Calcium: 10.4 mg/dL (ref 8.6–10.4)
Creat: 0.91 mg/dL (ref 0.60–0.93)
GFR, EST AFRICAN AMERICAN: 71 mL/min (ref >=60)
GFR, Est Non African American: 62 mL/min (ref >=60)
GLUCOSE: 129 mg/dL — AB (ref 65–99)
POTASSIUM: 4 mmol/L (ref 3.5–5.3)
Sodium: 139 mmol/L (ref 135–146)

## 2016-08-22 LAB — URINALYSIS, MICROSCOPIC ONLY
Bacteria, UA: NONE SEEN [HPF]
CASTS: NONE SEEN [LPF]
CRYSTALS: NONE SEEN [HPF]
RBC / HPF: NONE SEEN RBC/HPF (ref <=2)
Squamous Epithelial / HPF: NONE SEEN [HPF] (ref <=5)
WBC, UA: NONE SEEN WBC/HPF (ref <=5)
YEAST: NONE SEEN [HPF]

## 2016-08-22 LAB — LIPID PANEL
Cholesterol: 160 mg/dL (ref <200)
HDL: 38 mg/dL — ABNORMAL LOW (ref >50)
LDL CALC: 72 mg/dL (ref <100)
TRIGLYCERIDES: 251 mg/dL — AB (ref <150)
Total CHOL/HDL Ratio: 4.2 Ratio (ref <5.0)
VLDL: 50 mg/dL — AB (ref <30)

## 2016-08-22 LAB — HEMOGLOBIN A1C
HEMOGLOBIN A1C: 6.2 % — AB (ref <5.7)
MEAN PLASMA GLUCOSE: 131 mg/dL

## 2016-08-22 LAB — TSH: TSH: 2.64 m[IU]/L

## 2016-08-22 NOTE — Progress Notes (Signed)
[facility] [name], M.D.    [name]. [name], P.A.-C      [name], P.A.-C  [facility]                [address] [place] [address] Telephone [phone] Telefax [phone]  Annual Screening/Preventative Visit & Comprehensive Evaluation &  Examination     This very nice 76 y.o. MWF presents for a Screening/Preventative Visit & comprehensive evaluation and management of multiple medical co-morbidities.  Patient has been followed for HTN, T2_NIDDM, Hyperlipidemia and Vitamin D Deficiency. Patient has hx/o Rt Mastectomy (1990) and Lt lumpectomy in 2016  followed by Dr [name].     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       HTN predates since 65. Patient's BP has been controlled at home and patient denies any cardiac symptoms as chest pain, palpitations, shortness of breath, dizziness or ankle swelling. 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  Today's BP is at goal: 132/68.      Patient's hyperlipidemia is controlled with diet and medications. Patient denies myalgias or other medication SE's. Last lipids were at goal albeit elevated Trig's: Lab Results  Component Value Date   CHOL 172 05/21/2016   HDL 35 (L) 05/21/2016   LDLCALC 78 05/21/2016   TRIG 294 (H) 05/21/2016   CHOLHDL 4.9 05/21/2016      Patient has  prediabetes predating since 2009 then T2_DM with A1c 6.8% in 2012 and patient denies reactive hypoglycemic symptoms, visual blurring, diabetic polys, or paresthesias. Patient admits poor dietary compliance.  Last A1c was not at goal: Lab Results  Component Value Date   HGBA1C 6.0 (H) 05/21/2016      Finally, patient has history of Vitamin D Deficiency in 2009 of "12"  And she does not supplement Vit D as recommended. Last Vitamin D was still low: Lab Results  Component Value Date   VD25OH  28 (L) 05/21/2016   Current Outpatient Prescriptions on File Prior to Visit  Medication Sig  . albuterol (PROVENTIL HFA;VENTOLIN HFA) 108 (90 BASE) MCG/ACT inhaler Inhale 2 puffs into the lungs every 6 (six) hours as needed for wheezing or shortness of breath.  Marland Kitchen alendronate (FOSAMAX) 70 MG tablet Take 1 tablet (70 mg total) by mouth every 7 (seven) days. Take with a full glass of water on an empty stomach.  Marland Kitchen aspirin 81 MG tablet Take 81 mg by mouth daily.    . Cholecalciferol (VITAMIN D3) 2000 UNITS TABS Take 4,000 Units by mouth daily.   . fenofibrate micronized (LOFIBRA) 134 MG capsule TAKE ONE CAPSULE BY MOUTH ONCE DAILY  . fish oil-omega-3 fatty acids 1000 MG capsule Take 1 capsule by mouth 3 (three) times daily.   . Flaxseed, Linseed, (FLAX SEED OIL) 1000 MG CAPS Take 1,000 mg by mouth 3 (three) times daily.   . fluticasone (FLONASE) 50 MCG/ACT nasal spray Place 2 sprays into both nostrils daily.  Marland Kitchen letrozole (FEMARA) 2.5 MG tablet Take 1 tablet (2.5 mg total) by mouth daily.  Marland Kitchen losartan (COZAAR) 100 MG tablet TAKE ONE TABLET BY MOUTH ONCE DAILY  . Magnesium 500 MG TABS Take 500 mg by mouth 2 (two) times daily.   . metFORMIN (GLUCOPHAGE-XR) 500 MG 24 hr tablet TAKE FOUR TABLETS BY MOUTH ONCE  DAILY  . tiotropium (SPIRIVA) 18 MCG inhalation capsule Place 18 mcg into inhaler and inhale daily as needed (for wheezing).    No current facility-administered medications on file prior to visit.    Allergies  Allergen Reactions  . Bisoprolol Other (See Comments)    Decreased Heart Rate  . Ace Inhibitors Cough  . Acyclovir And Related Rash and Other (See Comments)    Okay to take famvir  . Amoxil [Amoxicillin] Diarrhea  . Ciprofloxacin Diarrhea  . Crestor [Rosuvastatin] Other (See Comments)    Elevated LFT's and Myalgias  . Doxycycline Diarrhea  . Enalapril Cough  . Lipitor [Atorvastatin] Other (See Comments)    Elevated LFT's and Myalgias  . Metformin And Related Diarrhea  .  Prednisone Other (See Comments)    Intolerant to High Dose Prednisone  . Vicodin [Hydrocodone-Acetaminophen] Other (See Comments)    Increased Anxiety and Nervousness   Past Medical History:  Diagnosis Date  . Allergic rhinitis   . ASCVD (arteriosclerotic cardiovascular disease)   . Atherosclerosis of aorta ([facility]) 2014   via CT AB/Pelvis- no AAA  . Breast cancer ([place] [facility]) 1990   right  . Cancer of lower-inner quadrant of female breast ([place]) 10/26/2014  . COPD (chronic obstructive pulmonary disease) ([place])   . CVA (cerebral vascular accident) ([place]) 1985  . CVA (cerebral vascular accident) ([place])   . Diabetes mellitus without complication ([place])   . DJD (degenerative joint disease)    neck, lower back, feet, hands  . Elevated hemoglobin A1c measurement   . Full dentures   . Hyperlipidemia   . Hypertension   . Impaired vision    macular degeneration  . Lymphocytosis    right arm  . Neuromuscular disorder ([place])    right arm/leg  weakness from stroke 1985  . Ovarian cyst   . Shortness of breath    occasional  . SVD (spontaneous vaginal delivery)    x 2  . Tobacco abuse   . Weakness of right side of body    Health Maintenance  Topic Date Due  . ZOSTAVAX  04/23/2001  . FOOT EXAM  07/31/2016  . OPHTHALMOLOGY EXAM  09/25/2016  . HEMOGLOBIN A1C  11/18/2016  . TETANUS/TDAP  07/22/2017  . COLONOSCOPY  10/20/2023  . INFLUENZA VACCINE  Completed  . DEXA SCAN  Completed  . PNA vac Low Risk Adult  Completed   Immunization History  Administered Date(s) Administered  . Hepatitis B, ped/adol 12/17/2013, 01/17/2014, 07/19/2014  . Influenza Split 05/17/2011, 04/21/2012  . Influenza Whole 04/23/2009, 04/13/2010  . Influenza, High Dose Seasonal PF 06/07/2014, 04/05/2015, 05/07/2016  . Influenza,inj,Quad PF,36+ Mos 04/06/2013  . PPD Test 06/07/2014  . Pneumococcal Conjugate-13 06/07/2014  . Pneumococcal Polysaccharide-23 05/17/2011  . Td 07/23/2007   Past Surgical History:  Procedure  Laterality Date  . ABDOMINAL HYSTERECTOMY  1976  . BACK SURGERY  2005   lower back  . bladder tact  1985  . BREAST LUMPECTOMY WITH RADIOACTIVE SEED LOCALIZATION Left 11/30/2014   Procedure: LEFT BREAST LUMPECTOMY WITH RADIOACTIVE SEED LOCALIZATION;  Surgeon: [name], MD;  Location: [facility];  Service: General;  Laterality: Left;  . BREAST SURGERY  1990   rightmastectomy-axillary nodes  . CHOLECYSTECTOMY  2006  . CHOLECYSTECTOMY N/A 2008   Dr. Rosana Hoes  . excision of benign salivary tumor Left 1968  . EYE SURGERY     bilateral cataract eye surgery - now low vision  . LAPAROSCOPIC BILATERAL SALPINGO OOPHERECTOMY Bilateral 11/30/2013   Procedure: LAPAROSCOPIC RIGHT  SALPINGO OOPHORECTOMY with extensive lysis of adhesions;  Surgeon: Allena Katz, MD;  Location: [facility];  Service: Gynecology;  Laterality: Bilateral;  . MASTECTOMY  1990   Right, no adjuvant  . MULTIPLE TOOTH EXTRACTIONS    . SHOULDER SURGERY Left 2007   Dr. Percell Miller   Family History  Problem Relation Age of Onset  . Emphysema Brother   . Emphysema Brother   . Emphysema Sister   . Allergies Brother     all brothers and sisters  . Asthma Sister   . Asthma Sister   . Heart disease Mother   . Cancer Mother     breast  . Heart disease Sister   . Heart disease Brother   . Cancer Sister     breast  . Colon cancer Neg Hx    Social History  Substance Use Topics  . Smoking status: Current Every Day Smoker    Packs/day: 1.00    Years: 55.00    Types: Cigarettes  . Smokeless tobacco: Current User     Comment: Electronic cig as well @ 0.'6mg'$  of e cig only  . Alcohol use No    ROS Constitutional: Denies fever, chills, weight loss/gain, headaches, insomnia,  night sweats, and change in appetite. Does c/o fatigue. Eyes: Denies redness, blurred vision, diplopia, discharge, itchy, watery eyes.  ENT: Denies discharge, congestion, post nasal drip, epistaxis, sore throat, earache, hearing loss, dental pain, Tinnitus,  Vertigo, Sinus pain, snoring.  Cardio: Denies chest pain, palpitations, irregular heartbeat, syncope, dyspnea, diaphoresis, orthopnea, PND, claudication, edema Respiratory: denies cough, dyspnea, DOE, pleurisy, hoarseness, laryngitis, wheezing.  Gastrointestinal: Denies dysphagia, heartburn, reflux, water brash, pain, cramps, nausea, vomiting, bloating, diarrhea, constipation, hematemesis, melena, hematochezia, jaundice, hemorrhoids Genitourinary: Denies dysuria, frequency, urgency, nocturia, hesitancy, discharge, hematuria, flank pain Breast: Breast lumps, nipple discharge, bleeding.  Musculoskeletal: Denies arthralgia, myalgia, stiffness, Jt. Swelling, pain, limp, and strain/sprain. Denies falls. Skin: Denies puritis, rash, hives, warts, acne, eczema, changing in skin lesion Neuro: No weakness, tremor, incoordination, spasms, paresthesia, pain Psychiatric: Denies confusion, memory loss, sensory loss. Denies Depression. Endocrine: Denies change in weight, skin, hair change, nocturia, and paresthesia, diabetic polys, visual blurring, hyper / hypo glycemic episodes.  Heme/Lymph: No excessive bleeding, bruising, enlarged lymph nodes.  Physical Exam  BP 132/68   Pulse 64   Temp 97.2 F (36.2 C)   Resp 16   Ht 5' 2.5" (1.588 m)   Wt 155 lb 12.8 oz (70.7 kg)   BMI 28.04 kg/m   General Appearance: Well nourished and in no apparent distress.  Eyes: mild anisocoria - L>R, EOMs, conjunctiva no swelling or erythema, fundi with scattered dot & blot H & E. Sinuses: No frontal/maxillary tenderness ENT/Mouth: EACs patent / TMs  nl. Nares clear without erythema, swelling, mucoid exudates. Oral hygiene is good. No erythema, swelling, or exudate. Tongue normal, non-obstructing. Tonsils not swollen or erythematous. Hearing normal.  Neck: Supple, thyroid normal. No bruits, nodes or JVD. Respiratory: Respiratory effort normal.  BS equal and clear bilateral without rales, rhonci, wheezing or  stridor. Cardio: Heart sounds are normal with regular rate and rhythm and no murmurs, rubs or gallops. Peripheral pulses are normal and equal bilaterally without edema. No aortic or femoral bruits. Chest: symmetric with normal excursions and percussion. Breasts: Symmetric, without lumps, nipple discharge, retractions, or fibrocystic changes.  Abdomen: Flat, soft with bowel sounds active. Nontender, no guarding, rebound, hernias, masses, or organomegaly.  Lymphatics: Non tender without lymphadenopathy.  Genitourinary:  Musculoskeletal: Mild spastic R HP  with limping gait and R sided hyperreflexia. Mild flexion contracturing of the R wrist/hand.  Skin: Warm and dry without rashes, lesions, cyanosis, clubbing or  ecchymosis.  Neuro: Cranial nerves intact, reflexes equal bilaterally. Normal muscle tone, no cerebellar symptoms. Sensation decreased to touch, vibratory and Monofilament in a stocking distribution to the feet bilaterally. Pysch: Alert and oriented X 3, normal affect, Insight and Judgment appropriate.   Assessment and Plan  1. Annual Preventative Screening Examination  2. Essential hypertension  - Microalbumin / creatinine urine ratio - EKG 12-Lead - Urinalysis, Routine w reflex microscopic - CBC with Differential/Platelet - BASIC METABOLIC PANEL WITH GFR - TSH  3. Hyperlipidemia  - EKG 12-Lead - Hepatic function panel - Lipid panel - TSH  4. Type 2 diabetes mellitus with stage 3 chronic kidney disease, without long-term current use of insulin (HCC)  - Microalbumin / creatinine urine ratio - EKG 12-Lead - Hemoglobin A1c - Insulin, random  5. Vitamin D deficiency  - VITAMIN D 25 Hydroxy   6. 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 ASCVD (arteriosclerotic cardiovascular disease)   8. DM type 2 with diabetic peripheral neuropathy (HCC)  - HM DIABETES FOOT EXAM - LOW EXTREMITY NEUR EXAM DOCUM - Hemoglobin A1c - Insulin, random  9. Screening for rectal cancer  - POC Hemoccult  Bld/Stl 10. Screening for ischemic heart disease  - EKG 12-Lead  11. Medication management  - Urinalysis, Routine w reflex microscopic - CBC with Differential/Platelet - BASIC METABOLIC PANEL WITH GFR - Hepatic function panel - Magnesium      Continue prudent diet as discussed, weight control, BP monitoring, regular exercise, and medications. Discussed med's effects and SE's. Screening labs and tests as requested with regular follow-up as recommended. Over 40 minutes of exam, counseling, chart review and high complex critical decision making was performed.

## 2016-08-22 NOTE — Patient Instructions (Signed)

## 2016-08-23 LAB — MAGNESIUM: Magnesium: 2.1 mg/dL (ref 1.5–2.5)

## 2016-08-23 LAB — VITAMIN D 25 HYDROXY (VIT D DEFICIENCY, FRACTURES): Vit D, 25-Hydroxy: 33 ng/mL (ref 30–100)

## 2016-08-23 LAB — MICROALBUMIN / CREATININE URINE RATIO
CREATININE, URINE: 36 mg/dL (ref 20–320)
MICROALB UR: 0.2 mg/dL
MICROALB/CREAT RATIO: 6 ug/mg{creat} (ref <30)

## 2016-08-23 LAB — INSULIN, RANDOM: INSULIN: 50.9 u[IU]/mL — AB (ref 2.0–19.6)

## 2016-09-12 ENCOUNTER — Other Ambulatory Visit: Payer: Self-pay | Admitting: Physician Assistant

## 2016-09-19 ENCOUNTER — Other Ambulatory Visit: Payer: Self-pay | Admitting: Oncology

## 2016-09-19 DIAGNOSIS — Z1231 Encounter for screening mammogram for malignant neoplasm of breast: Secondary | ICD-10-CM

## 2016-09-19 DIAGNOSIS — Z853 Personal history of malignant neoplasm of breast: Secondary | ICD-10-CM

## 2016-10-25 ENCOUNTER — Ambulatory Visit
Admission: RE | Admit: 2016-10-25 | Discharge: 2016-10-25 | Disposition: A | Discharge status: Home / Self Care | Payer: PPO | Source: Ambulatory Visit | Attending: Oncology | Admitting: Oncology

## 2016-10-25 DIAGNOSIS — R928 Other abnormal and inconclusive findings on diagnostic imaging of breast: Secondary | ICD-10-CM | POA: Diagnosis not present

## 2016-10-25 DIAGNOSIS — Z853 Personal history of malignant neoplasm of breast: Secondary | ICD-10-CM

## 2016-10-25 DIAGNOSIS — Z1231 Encounter for screening mammogram for malignant neoplasm of breast: Secondary | ICD-10-CM

## 2016-10-28 ENCOUNTER — Telehealth: Payer: Self-pay | Admitting: Oncology

## 2016-10-28 NOTE — Telephone Encounter (Signed)
Patient needs to reschedule her appointment on 4/11

## 2016-10-29 ENCOUNTER — Other Ambulatory Visit: Payer: Self-pay | Admitting: Internal Medicine

## 2016-10-30 ENCOUNTER — Ambulatory Visit: Payer: PPO | Admitting: Oncology

## 2016-10-30 ENCOUNTER — Other Ambulatory Visit: Payer: PPO

## 2016-10-30 ENCOUNTER — Telehealth: Payer: Self-pay | Admitting: Oncology

## 2016-10-30 NOTE — Telephone Encounter (Signed)
Spoke with patient re lab/fu 5/2 - rescheduled from 4/11

## 2016-11-01 ENCOUNTER — Other Ambulatory Visit: Payer: Self-pay | Admitting: Internal Medicine

## 2016-11-20 ENCOUNTER — Ambulatory Visit: Payer: PPO | Admitting: Oncology

## 2016-11-20 ENCOUNTER — Other Ambulatory Visit: Payer: PPO

## 2016-11-21 ENCOUNTER — Ambulatory Visit: Payer: Self-pay | Admitting: Physician Assistant

## 2016-11-24 NOTE — Progress Notes (Signed)
MEDICARE ANNUAL WELLNESS VISIT AND FOLLOW UP  Assessment:     Essential hypertension -well controlled today -cont meds -diet and exercise -recommended quitting smoking - TSH   C V A / STROKE -followed by cards -cont daily asa -recommended quitting smoking  ASCVD (arteriosclerotic cardiovascular disease) -followed by cards -cont daily asa -recommended quitting smoking  Aortic atherosclerosis (HCC) -cont asa -cont cholesterol meds -quit smoking  Seasonal and perennial allergic rhinitis -followed by pulmonology  COPD mixed type ([place]) -followed by pulmonology  Diverticulosis of colon without hemorrhage -follow with scheduled colonoscopy -cipro and flagyl given for possible flare   Other cirrhosis of liver ([place]) -monitor LFTs -avoid tylenol and alcohol  Type 2 diabetes mellitus with stage 3 chronic kidney disease, without long-term current use of insulin (HCC) -cont diet and exercise -cont meds - Hemoglobin A1c  Primary osteoarthritis involving multiple joints -exercise -avoid tylenol for pain management due to liver   Hyperlipidemia -cont meds - Lipid panel  TOBACCO USER -not ready to quit smoking   Malignant 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 Sabra Heck  Acute pain of left knee -     Ambulatory referral to Orthopedics -     Uric acid -     D-dimer, quantitative (not at [facility]) -     meloxicam (MOBIC) 15 MG tablet; Take one daily with food for 2 weeks, can take with tylenol, can not take with aleve, iburpofen, then as needed daily for pain  Left leg swelling -     Uric acid -     D-dimer, quantitative (not at  [facility])    Over 30 minutes of exam, counseling, chart review, and critical decision making was performed  Plan:   During the course of the visit the patient was educated and counseled about appropriate screening and preventive services including:    Pneumococcal vaccine   Influenza vaccine  Td vaccine  Prevnar 13  Screening electrocardiogram  Screening mammography  Bone densitometry screening  Colorectal cancer screening  Diabetes screening  Glaucoma screening  Nutrition counseling   Advanced directives: given info/requested copies   Subjective:   [name] is a 76 y.o. female who presents for Medicare Annual Wellness Visit and 3 month follow up on hypertension, prediabetes, hyperlipidemia, vitamin D def.   Her blood pressure has been controlled at home, today their BP is BP: 138/74 She does not workout, has had left knee pain x 2 weeks, coming and going, some warm/redness with swelling, worse at night, will pop and click, some instability x 2, no injury, no distal swelling, no medications puts sav on it and brace on it that helps some.   She denies chest pain, shortness of breath, dizziness.  Patient can not read due to blindness She is on cholesterol medication and denies myalgias. Her cholesterol is at goal. The cholesterol last visit was:   Lab Results  Component Value Date   CHOL 160 08/22/2016   HDL 38 (L) 08/22/2016   LDLCALC 72 08/22/2016   TRIG 251 (H) 08/22/2016   CHOLHDL 4.2 08/22/2016   She has been working on diet and exercise for prediabetes, and denies foot ulcerations, hyperglycemia, hypoglycemia , increased appetite, nausea, paresthesia of  the feet, polydipsia, polyuria, visual disturbances, vomiting and weight loss. Last A1C in the office was:  Lab Results  Component Value Date   HGBA1C 6.2 (H) 08/22/2016   Last GFR Lab Results  Component Value Date   GFRNONAA 62 08/22/2016   Patient is on Vitamin D supplement. Lab Results   Component Value Date   VD25OH 33 08/22/2016     She has not quit smoking.  She is not committed to the idea of stopping at this time.  She smokes a 1/2 pack per day.   Follows with oncology for history of breast cancer, UTD on MGM.  BMI is Body mass index is 28.51 kg/m., she is working on diet and exercise. Wt Readings from Last 3 Encounters:  11/25/16 158 lb 6.4 oz (71.8 kg)  08/22/16 155 lb 12.8 oz (70.7 kg)  05/21/16 154 lb (69.9 kg)    Medication Review Current Outpatient Prescriptions on File Prior to Visit  Medication Sig Dispense Refill  . albuterol (PROVENTIL HFA;VENTOLIN HFA) 108 (90 BASE) MCG/ACT inhaler Inhale 2 puffs into the lungs every 6 (six) hours as needed for wheezing or shortness of breath.    Marland Kitchen alendronate (FOSAMAX) 70 MG tablet Take 1 tablet (70 mg total) by mouth every 7 (seven) days. Take with a full glass of water on an empty stomach. 12 tablet 3  . aspirin 81 MG tablet Take 81 mg by mouth daily.      . Cholecalciferol (VITAMIN D3) 2000 UNITS TABS Take 4,000 Units by mouth daily.     . fenofibrate micronized (LOFIBRA) 134 MG capsule TAKE ONE CAPSULE BY MOUTH ONCE DAILY 90 capsule 1  . fish oil-omega-3 fatty acids 1000 MG capsule Take 1 capsule by mouth 3 (three) times daily.     . Flaxseed, Linseed, (FLAX SEED OIL) 1000 MG CAPS Take 1,000 mg by mouth 3 (three) times daily.     . fluticasone (FLONASE) 50 MCG/ACT nasal spray Place 2 sprays into both nostrils daily. 16 g 1  . letrozole (FEMARA) 2.5 MG tablet Take 1 tablet (2.5 mg total) by mouth daily. 90 tablet 3  . losartan (COZAAR) 100 MG tablet TAKE ONE TABLET BY MOUTH ONCE DAILY 90 tablet 1  . Magnesium 500 MG TABS Take 500 mg by mouth 2 (two) times daily.     . metFORMIN (GLUCOPHAGE-XR) 500 MG 24 hr tablet TAKE FOUR TABLETS BY MOUTH ONCE DAILY 360 tablet 1  . tiotropium (SPIRIVA) 18 MCG inhalation capsule Place 18 mcg into inhaler and inhale daily as needed (for wheezing).      No current  facility-administered medications on file prior to visit.     Current Problems (verified) Patient Active Problem List   Diagnosis Date Noted  . Aortic atherosclerosis ([place]) 10/31/2015  . 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 Cancer of lower-inner quadrant of left female breast ([place]) 10/26/2014  . 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 Medication management 11/23/2013  . Vitamin D deficiency 11/23/2013  . Hepatic cirrhosis ([place]) 10/01/2013  . Diverticulosis of colon without hemorrhage 08/24/2013  . DJD (degenerative joint disease)   . ASCVD (arteriosclerotic cardiovascular disease)   . Seasonal and perennial allergic rhinitis 01/18/2010  . Mixed hyperlipidemia 01/12/2010  . TOBACCO USER 01/12/2010  . Essential hypertension 01/12/2010    Screening Tests Immunization History  Administered Date(s) Administered  . Hepatitis B, ped/adol 12/17/2013, 01/17/2014, 07/19/2014  . Influenza Split 05/17/2011, 04/21/2012  . Influenza Whole 04/23/2009, 04/13/2010  . Influenza,  High Dose Seasonal PF 06/07/2014, 04/05/2015, 05/07/2016  . Influenza,inj,Quad PF,36+ Mos 04/06/2013  . PPD Test 06/07/2014  . Pneumococcal Conjugate-13 06/07/2014  . Pneumococcal Polysaccharide-23 05/17/2011  . Td 07/23/2007    Preventative care: Last colonoscopy: 2015 Last mammogram: 10/25/16 Last pap smear/pelvic exam: 10/25/15  LOVF:[id number]  Prior vaccinations: TD or Tdap: 2009  Influenza: 2017 Pneumococcal: 2012 Prevnar13: 2015 Shingles/Zostavax: Declined  Names of Other Physician/Practitioners you currently use: 1. [facility]- here for primary care 2. Dr. Sabra Heck, eye doctor, last visit 2017 may not keep going because there is nothing that can be done 3. Dentures, dentist, last visit not seeing one Patient Care Team: [name], MD as PCP - General (Internal Medicine) Inda Castle, MD as Consulting Physician (Gastroenterology) Deneise Lever,  MD as Consulting Physician (Pulmonary Disease) Marica Otter, [place] (Optometry) [name], MD as Consulting Physician (General Surgery) Magrinat, Virgie Dad, MD as Consulting Physician (Oncology) Eppie Gibson, MD as Attending Physician (Radiation Oncology) Mauro Kaufmann, RN as Registered Nurse Rockwell Germany, RN as Registered Nurse Jake Shark Johny Blamer, NP as Nurse Practitioner (Nurse Practitioner)  Allergies Allergies  Allergen Reactions  . Bisoprolol Other (See Comments)    Decreased Heart Rate  . Ace Inhibitors Cough  . Acyclovir And Related Rash and Other (See Comments)    Okay to take famvir  . Amoxil [Amoxicillin] Diarrhea  . Ciprofloxacin Diarrhea  . Crestor [Rosuvastatin] Other (See Comments)    Elevated LFT's and Myalgias  . Doxycycline Diarrhea  . Enalapril Cough  . Lipitor [Atorvastatin] Other (See Comments)    Elevated LFT's and Myalgias  . Metformin And Related Diarrhea  . Prednisone Other (See Comments)    Intolerant to High Dose Prednisone  . 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 FAMILY HISTORY Her family history includes Allergies in her brother; Asthma in her sister and sister; Breast cancer in her sister and sister; Cancer in her mother and sister; Emphysema in her brother, brother, and sister; Heart disease in her brother, mother, and sister. SOCIAL HISTORY She  reports that she has been  smoking Cigarettes.  She has a 55.00 pack-year smoking history. She uses smokeless tobacco. She reports that she does not drink alcohol or use drugs.  MEDICARE WELLNESS OBJECTIVES: Physical activity: Current Exercise Habits: The patient does not participate in regular exercise at present, Exercise limited by: orthopedic condition(s) Cardiac risk factors: Cardiac Risk Factors include: advanced age (>38mn, >>58women);diabetes mellitus;dyslipidemia;hypertension;sedentary lifestyle;smoking/ tobacco exposure;family history of premature cardiovascular disease Depression/mood screen:   [facility] 11/25[facility]  Decreased Interest 0  Down, Depressed, Hopeless 0  PHQ - 2 Score 0    ADLs:  In your present state of health, do you have any difficulty performing the following activities: 11/25/2016 02/18/2016  Hearing? N N  Vision? Y N  Difficulty concentrating or making decisions? N N  Walking or climbing stairs? Y N  Dressing or bathing? N N  Doing errands, shopping? Y N  Preparing Food and eating ? N -  Using the Toilet? Y -  In  the past six months, have you accidently leaked urine? Y -  Do you have problems with loss of bowel control? N -  Managing your Medications? N -  Managing your Finances? N -  Housekeeping or managing your Housekeeping? N -  Some recent data might be hidden     Cognitive Testing  Alert? Yes  Normal Appearance?Yes  Oriented to person? Yes  Place? Yes   Time? Yes  Recall of three objects?  Yes  Can perform simple calculations? Yes  Displays appropriate judgment?Yes  Can read the correct time from a watch face?Yes  EOL planning: Does Patient Have a Medical Advance Directive?: No Would patient like information on creating a medical advance directive?: Yes (ED - Information included in AVS)   Objective:   Today's Vitals   11/25/16 1541  BP: 138/74  Pulse: 73  Resp: 16  Temp: 97.4 F (36.3 C)  SpO2: 97%  Weight: 158 lb 6.4 oz (71.8 kg)  Height: 5'  2.5" (1.588 m)  PainSc: 7   PainLoc: Leg   Body mass index is 28.51 kg/m.  General appearance: alert, no distress, WD/WN,  female HEENT: normocephalic, sclerae anicteric, TMs pearly, nares patent, no discharge or erythema, pharynx normal Oral cavity: MMM, no lesions Neck: supple, no lymphadenopathy, no thyromegaly, no masses Heart: RRR, normal S1, S2, no murmurs Lungs: CTA bilaterally, no wheezes, rhonchi, or rales Abdomen: +bs, soft, non tender, non distended, no masses, no hepatomegaly, no splenomegaly Musculoskeletal: left knee with tenderness MCL and medial joint line, mild effusion, MCL laxity, no ACL/PCL. nontender, no swelling, no obvious deformity Extremities: mild edema left leg compared to right, no cyanosis, no clubbing Pulses: 2+ symmetric, upper and lower extremities, normal cap refill Neurological: alert, oriented x 3, CN2-12 intact, strength normal upper extremities and lower extremities, sensation normal throughout, DTRs 2+ throughout, no cerebellar signs, gait normal Psychiatric: normal affect, behavior normal, pleasant  Breast: defer Gyn: defer Rectal: defer   Medicare Attestation I have personally reviewed: The patient's medical and social history Their use of alcohol, tobacco or illicit drugs Their current medications and supplements The patient's functional ability including ADLs,fall risks, home safety risks, cognitive, and hearing and visual impairment Diet and physical activities Evidence for depression or mood disorders  The patient's weight, height, BMI, and visual acuity have been recorded in the chart.  I have made referrals, counseling, and provided education to the patient based on review of the above and I have provided the patient with a written personalized care plan for preventive services.     Vicie Mutters, PA-C   11/25/2016

## 2016-11-25 ENCOUNTER — Encounter: Payer: Self-pay | Admitting: Physician Assistant

## 2016-11-25 ENCOUNTER — Ambulatory Visit (INDEPENDENT_AMBULATORY_CARE_PROVIDER_SITE_OTHER): Payer: PPO | Admitting: Physician Assistant

## 2016-11-25 VITALS — BP 138/74 | HR 73 | Temp 97.4°F | Resp 16 | Ht 62.5 in | Wt 158.4 lb

## 2016-11-25 DIAGNOSIS — E559 Vitamin D deficiency, unspecified: Secondary | ICD-10-CM

## 2016-11-25 DIAGNOSIS — M7989 Other specified soft tissue disorders: Secondary | ICD-10-CM | POA: Diagnosis not present

## 2016-11-25 DIAGNOSIS — E1122 Type 2 diabetes mellitus with diabetic chronic kidney disease: Secondary | ICD-10-CM | POA: Diagnosis not present

## 2016-11-25 DIAGNOSIS — H548 Legal blindness, as defined in USA: Secondary | ICD-10-CM

## 2016-11-25 DIAGNOSIS — N183 Chronic kidney disease, stage 3 unspecified: Secondary | ICD-10-CM

## 2016-11-25 DIAGNOSIS — M159 Polyosteoarthritis, unspecified: Secondary | ICD-10-CM

## 2016-11-25 DIAGNOSIS — I2510 Atherosclerotic heart disease of native coronary artery without angina pectoris: Secondary | ICD-10-CM

## 2016-11-25 DIAGNOSIS — I700 Atherosclerosis of aorta: Secondary | ICD-10-CM | POA: Diagnosis not present

## 2016-11-25 DIAGNOSIS — J3089 Other allergic rhinitis: Secondary | ICD-10-CM

## 2016-11-25 DIAGNOSIS — R6889 Other general symptoms and signs: Secondary | ICD-10-CM | POA: Diagnosis not present

## 2016-11-25 DIAGNOSIS — Z79899 Other long term (current) drug therapy: Secondary | ICD-10-CM

## 2016-11-25 DIAGNOSIS — F17200 Nicotine dependence, unspecified, uncomplicated: Secondary | ICD-10-CM | POA: Diagnosis not present

## 2016-11-25 DIAGNOSIS — C50312 Malignant neoplasm of lower-inner quadrant of left female breast: Secondary | ICD-10-CM

## 2016-11-25 DIAGNOSIS — Z0001 Encounter for general adult medical examination with abnormal findings: Secondary | ICD-10-CM | POA: Diagnosis not present

## 2016-11-25 DIAGNOSIS — M150 Primary generalized (osteo)arthritis: Secondary | ICD-10-CM | POA: Diagnosis not present

## 2016-11-25 DIAGNOSIS — K5730 Diverticulosis of large intestine without perforation or abscess without bleeding: Secondary | ICD-10-CM | POA: Diagnosis not present

## 2016-11-25 DIAGNOSIS — E782 Mixed hyperlipidemia: Secondary | ICD-10-CM

## 2016-11-25 DIAGNOSIS — M25562 Pain in left knee: Secondary | ICD-10-CM | POA: Diagnosis not present

## 2016-11-25 DIAGNOSIS — J302 Other seasonal allergic rhinitis: Secondary | ICD-10-CM

## 2016-11-25 DIAGNOSIS — K7469 Other cirrhosis of liver: Secondary | ICD-10-CM | POA: Diagnosis not present

## 2016-11-25 DIAGNOSIS — Z0000 Encounter for general adult medical examination without abnormal findings: Secondary | ICD-10-CM

## 2016-11-25 DIAGNOSIS — I10 Essential (primary) hypertension: Secondary | ICD-10-CM

## 2016-11-25 LAB — CBC WITH DIFFERENTIAL/PLATELET
BASOS ABS: 0 {cells}/uL (ref 0–200)
Basophils Relative: 0 %
EOS PCT: 2 %
Eosinophils Absolute: 172 cells/uL (ref 15–500)
HEMATOCRIT: 40.1 % (ref 35.0–45.0)
Hemoglobin: 13.9 g/dL (ref 11.7–15.5)
LYMPHS PCT: 53 %
Lymphs Abs: 4558 cells/uL — ABNORMAL HIGH (ref 850–3900)
MCH: 32.9 pg (ref 27.0–33.0)
MCHC: 34.7 g/dL (ref 32.0–36.0)
MCV: 95 fL (ref 80.0–100.0)
MPV: 10.1 fL (ref 7.5–12.5)
Monocytes Absolute: 774 cells/uL (ref 200–950)
Monocytes Relative: 9 %
NEUTROS PCT: 36 %
Neutro Abs: 3096 cells/uL (ref 1500–7800)
Platelets: 226 10*3/uL (ref 140–400)
RBC: 4.22 MIL/uL (ref 3.80–5.10)
RDW: 13.4 % (ref 11.0–15.0)
WBC: 8.6 10*3/uL (ref 3.8–10.8)

## 2016-11-25 LAB — TSH: TSH: 2.84 m[IU]/L

## 2016-11-25 MED ORDER — CIPROFLOXACIN HCL 500 MG PO TABS: 500.0000 mg | ORAL_TABLET | Freq: Two times a day (BID) | ORAL | 0 refills | Status: AC

## 2016-11-25 MED ORDER — METRONIDAZOLE 500 MG PO TABS: 500.0000 mg | ORAL_TABLET | Freq: Three times a day (TID) | ORAL | 0 refills | Status: AC

## 2016-11-25 MED ORDER — MELOXICAM 15 MG PO TABS: ORAL_TABLET | ORAL | 1 refills | Status: DC

## 2016-11-25 NOTE — Patient Instructions (Addendum)
Please call Dr. Percell Miller Phone: [phone];  Knee Pain, Adult Knee pain in adults is common. It can be caused by many things, including:  Arthritis.  A fluid-filled sac (cyst) or growth in your knee.  An infection in your knee.  An injury that will not heal.  Damage, swelling, or irritation of the tissues that support your knee. Knee pain is usually not a sign of a serious problem. The pain may go away on its own with time and rest. If it does not, a health care provider may order tests to find the cause of the pain. These may include:  Imaging tests, such as an X-ray, MRI, or ultrasound.  Joint aspiration. In this test, fluid is removed from the knee.  Arthroscopy. In this test, a lighted tube is inserted into knee and an image is projected onto a TV screen.  A biopsy. In this test, a sample of tissue is removed from the body and studied under a microscope. Follow these instructions at home: Pay attention to any changes in your symptoms. Take these actions to relieve your pain. Activity   Rest your knee.  Do not do things that cause pain or make pain worse.  Avoid high-impact activities or exercises, such as running, jumping rope, or doing jumping jacks. General instructions   Take over-the-counter and prescription medicines only as told by your health care provider.  Raise (elevate) your knee above the level of your heart when you are sitting or lying down.  Sleep with a pillow under your knee.  If directed, apply ice to the knee:  Put ice in a plastic bag.  Place a towel between your skin and the bag.  Leave the ice on for 20 minutes, 2-3 times a day.  Ask your health care provider if you should wear an elastic knee support.  Lose weight if you are overweight. Extra weight can put pressure on your knee.  Do not use any products that contain nicotine or tobacco, such as cigarettes and e-cigarettes. Smoking may slow the healing of any bone and joint problems that  you may have. If you need help quitting, ask your health care provider. Contact a health care provider if:  Your knee pain continues, changes, or gets worse.  You have a fever along with knee pain.  Your knee buckles or locks up.  Your knee swells, and the swelling becomes worse. Get help right away if:  Your knee feels warm to the touch.  You cannot move your knee.  You have severe pain in your knee.  You have chest pain.  You have trouble breathing. Summary  Knee pain in adults is common. It can be caused by many things, including, arthritis, infection, cysts, or injury.  Knee pain is usually not a sign of a serious problem, but if it does not go away, a health care provider may perform tests to know the cause of the pain.  Pay attention to any changes in your symptoms. Relieve your pain with rest, medicines, light activity, and use of ice.  Get help if your pain continues or becomes very severe, or if your knee buckles or locks up, or if you have chest pain or trouble breathing. This information is not intended to replace advice given to you by your health care provider. Make sure you discuss any questions you have with your health care provider. Document Released: 05/05/2007 Document Revised: 06/28/2016 Document Reviewed: 06/28/2016 Elsevier Interactive Patient Education  2017 [employer].  Diverticulitis Diverticulitis is inflammation or infection of small pouches in your colon that form when you have a condition called diverticulosis. The pouches in your colon are called diverticula. Your colon, or large intestine, is where water is absorbed and stool is formed. Complications of diverticulitis can include:  Bleeding.  Severe infection.  Severe pain.  Perforation of your colon.  Obstruction of your colon. What are the causes? Diverticulitis is caused by bacteria. Diverticulitis happens when stool becomes trapped in diverticula. This allows bacteria to grow  in the diverticula, which can lead to inflammation and infection. What increases the risk? People with diverticulosis are at risk for diverticulitis. Eating a diet that does not include enough fiber from fruits and vegetables may make diverticulitis more likely to develop. What are the signs or symptoms? Symptoms of diverticulitis may include:  Abdominal pain and tenderness. The pain is normally located on the left side of the abdomen, but may occur in other areas.  Fever and chills.  Bloating.  Cramping.  Nausea.  Vomiting.  Constipation.  Diarrhea.  Blood in your stool. How is this diagnosed? Your health care provider will ask you about your medical history and do a physical exam. You may need to have tests done because many medical conditions can cause the same symptoms as diverticulitis. Tests may include:  Blood tests.  Urine tests.  Imaging tests of the abdomen, including X-rays and CT scans. When your condition is under control, your health care provider may recommend that you have a colonoscopy. A colonoscopy can show how severe your diverticula are and whether something else is causing your symptoms. How is this treated? Most cases of diverticulitis are mild and can be treated at home. Treatment may include:  Taking over-the-counter pain medicines.  Following a clear liquid diet.  Taking antibiotic medicines by mouth for 7-10 days. More severe cases may be treated at a hospital. Treatment may include:  Not eating or drinking.  Taking prescription pain medicine.  Receiving antibiotic medicines through an IV tube.  Receiving fluids and nutrition through an IV tube.  Surgery. Follow these instructions at home:  Follow your health care provider's instructions carefully.  Follow a full liquid diet or other diet as directed by your health care provider. After your symptoms improve, your health care provider may tell you to change your diet. He or she may  recommend you eat a high-fiber diet. Fruits and vegetables are good sources of fiber. Fiber makes it easier to pass stool.  Take fiber supplements or probiotics as directed by your health care provider.  Only take medicines as directed by your health care provider.  Keep all your follow-up appointments. Contact a health care provider if:  Your pain does not improve.  You have a hard time eating food.  Your bowel movements do not return to normal. Get help right away if:  Your pain becomes worse.  Your symptoms do not get better.  Your symptoms suddenly get worse.  You have a fever.  You have repeated vomiting.  You have bloody or black, tarry stools. This information is not intended to replace advice given to you by your health care provider. Make sure you discuss any questions you have with your health care provider. Document Released: 04/17/2005 Document Revised: 12/14/2015 Document Reviewed: 06/02/2013 Elsevier Interactive Patient Education  2017 [employer].

## 2016-11-26 ENCOUNTER — Other Ambulatory Visit: Payer: Self-pay | Admitting: Physician Assistant

## 2016-11-26 DIAGNOSIS — M79605 Pain in left leg: Secondary | ICD-10-CM

## 2016-11-26 LAB — HEPATIC FUNCTION PANEL
ALT: 38 U/L — AB (ref 6–29)
AST: 35 U/L (ref 10–35)
Albumin: 4.3 g/dL (ref 3.6–5.1)
Alkaline Phosphatase: 85 U/L (ref 33–130)
BILIRUBIN TOTAL: 0.5 mg/dL (ref 0.2–1.2)
Bilirubin, Direct: 0.1 mg/dL (ref <=0.2)
Indirect Bilirubin: 0.4 mg/dL (ref 0.2–1.2)
Total Protein: 7.3 g/dL (ref 6.1–8.1)

## 2016-11-26 LAB — BASIC METABOLIC PANEL WITH GFR
BUN: 16 mg/dL (ref 7–25)
CALCIUM: 10.1 mg/dL (ref 8.6–10.4)
CO2: 22 mmol/L (ref 20–31)
CREATININE: 0.88 mg/dL (ref 0.60–0.93)
Chloride: 105 mmol/L (ref 98–110)
GFR, EST NON AFRICAN AMERICAN: 64 mL/min (ref >=60)
GFR, Est African American: 74 mL/min (ref >=60)
GLUCOSE: 113 mg/dL — AB (ref 65–99)
Potassium: 4.2 mmol/L (ref 3.5–5.3)
SODIUM: 138 mmol/L (ref 135–146)

## 2016-11-26 LAB — HEMOGLOBIN A1C
HEMOGLOBIN A1C: 6.2 % — AB (ref <5.7)
Mean Plasma Glucose: 131 mg/dL

## 2016-11-26 LAB — D-DIMER, QUANTITATIVE (NOT AT ARMC): D DIMER QUANT: 1.13 ug{FEU}/mL — AB (ref <0.50)

## 2016-11-26 LAB — LIPID PANEL
CHOL/HDL RATIO: 3.8 ratio (ref <5.0)
Cholesterol: 165 mg/dL (ref <200)
HDL: 44 mg/dL — AB (ref >50)
LDL Cholesterol: 87 mg/dL (ref <100)
Triglycerides: 170 mg/dL — ABNORMAL HIGH (ref <150)
VLDL: 34 mg/dL — AB (ref <30)

## 2016-11-26 LAB — URIC ACID: Uric Acid, Serum: 3.9 mg/dL (ref 2.5–7.0)

## 2016-11-26 LAB — MAGNESIUM: Magnesium: 2.2 mg/dL (ref 1.5–2.5)

## 2016-11-26 NOTE — Progress Notes (Signed)
Pt aware of lab results & voiced understanding of those results. DVT US was scheduled for 9th May 2018 @ 11am by DD  Pt states she is unsure if she will be able to make it to the appt but she will talk to her husband about it when he gets home & then call back to inform us on if she will make it because if given enough time we can just reschedule for a time that will work for her. Pt states she can not see so she has to get a ride from her husband.

## 2016-11-27 ENCOUNTER — Ambulatory Visit (HOSPITAL_COMMUNITY): Payer: PPO

## 2016-11-27 ENCOUNTER — Ambulatory Visit (HOSPITAL_COMMUNITY)
Admission: RE | Admit: 2016-11-27 | Discharge: 2016-11-27 | Disposition: A | Discharge status: Home / Self Care | Payer: PPO | Source: Ambulatory Visit | Attending: Physician Assistant | Admitting: Physician Assistant

## 2016-11-27 DIAGNOSIS — M79605 Pain in left leg: Secondary | ICD-10-CM | POA: Diagnosis not present

## 2016-11-27 NOTE — Progress Notes (Signed)
VASCULAR LAB PRELIMINARY  PRELIMINARY  PRELIMINARY  PRELIMINARY  Left lower extremity venous duplex completed.    Preliminary report:  Left:  No evidence of DVT, superficial thrombosis, or Baker's cyst.  Chanon Loney, RVS 11/27/2016, 1:48 PM

## 2016-11-28 NOTE — Progress Notes (Signed)
Pt aware of lab results & voiced understanding of those results. Pt states she will call ORTHO.

## 2016-12-03 ENCOUNTER — Other Ambulatory Visit: Payer: Self-pay

## 2016-12-03 DIAGNOSIS — C50312 Malignant neoplasm of lower-inner quadrant of left female breast: Secondary | ICD-10-CM

## 2016-12-04 ENCOUNTER — Other Ambulatory Visit (HOSPITAL_BASED_OUTPATIENT_CLINIC_OR_DEPARTMENT_OTHER): Payer: PPO

## 2016-12-04 ENCOUNTER — Ambulatory Visit (HOSPITAL_BASED_OUTPATIENT_CLINIC_OR_DEPARTMENT_OTHER): Payer: PPO | Admitting: Oncology

## 2016-12-04 VITALS — BP 146/58 | HR 64 | Temp 98.2°F | Resp 19 | Ht 62.5 in | Wt 160.8 lb

## 2016-12-04 DIAGNOSIS — Z170 Estrogen receptor positive status [ER+]: Secondary | ICD-10-CM

## 2016-12-04 DIAGNOSIS — C50312 Malignant neoplasm of lower-inner quadrant of left female breast: Secondary | ICD-10-CM | POA: Diagnosis not present

## 2016-12-04 DIAGNOSIS — M810 Age-related osteoporosis without current pathological fracture: Secondary | ICD-10-CM | POA: Diagnosis not present

## 2016-12-04 DIAGNOSIS — Z79811 Long term (current) use of aromatase inhibitors: Secondary | ICD-10-CM

## 2016-12-04 LAB — COMPREHENSIVE METABOLIC PANEL
ALBUMIN: 3.8 g/dL (ref 3.5–5.0)
ALK PHOS: 95 U/L (ref 40–150)
ALT: 41 U/L (ref 0–55)
AST: 37 U/L — AB (ref 5–34)
Anion Gap: 8 mEq/L (ref 3–11)
BILIRUBIN TOTAL: 0.36 mg/dL (ref 0.20–1.20)
BUN: 15.8 mg/dL (ref 7.0–26.0)
CALCIUM: 10 mg/dL (ref 8.4–10.4)
CO2: 21 mEq/L — ABNORMAL LOW (ref 22–29)
Chloride: 109 mEq/L (ref 98–109)
Creatinine: 1 mg/dL (ref 0.6–1.1)
EGFR: 57 mL/min/{1.73_m2} — AB (ref >90)
GLUCOSE: 196 mg/dL — AB (ref 70–140)
Potassium: 4.1 mEq/L (ref 3.5–5.1)
Sodium: 139 mEq/L (ref 136–145)
TOTAL PROTEIN: 7.5 g/dL (ref 6.4–8.3)

## 2016-12-04 LAB — CBC WITH DIFFERENTIAL/PLATELET
BASO%: 0.2 % (ref 0.0–2.0)
BASOS ABS: 0 10*3/uL (ref 0.0–0.1)
EOS ABS: 0.2 10*3/uL (ref 0.0–0.5)
EOS%: 2.6 % (ref 0.0–7.0)
HEMATOCRIT: 38.6 % (ref 34.8–46.6)
HEMOGLOBIN: 13.5 g/dL (ref 11.6–15.9)
LYMPH#: 4 10*3/uL — AB (ref 0.9–3.3)
LYMPH%: 61.7 % — ABNORMAL HIGH (ref 14.0–49.7)
MCH: 33.5 pg (ref 25.1–34.0)
MCHC: 35 g/dL (ref 31.5–36.0)
MCV: 95.8 fL (ref 79.5–101.0)
MONO#: 0.7 10*3/uL (ref 0.1–0.9)
MONO%: 11.2 % (ref 0.0–14.0)
NEUT%: 24.3 % — ABNORMAL LOW (ref 38.4–76.8)
NEUTROS ABS: 1.6 10*3/uL (ref 1.5–6.5)
Platelets: 221 10*3/uL (ref 145–400)
RBC: 4.03 10*6/uL (ref 3.70–5.45)
RDW: 13.1 % (ref 11.2–14.5)
WBC: 6.4 10*3/uL (ref 3.9–10.3)

## 2016-12-04 LAB — TECHNOLOGIST REVIEW

## 2016-12-04 NOTE — Progress Notes (Signed)
[name]  Telephone:([phone]) [phone] Fax:([phone]) [phone]     ID: KANYA POTTEIGER DOB: July 01, 1941  MR#: [id number]  KWI#:[id number]  Patient Care Team: [name], MD as PCP - General (Internal Medicine) Inda Castle, MD as Consulting Physician (Gastroenterology) Deneise Lever, MD as Consulting Physician (Pulmonary Disease) Marica Otter, [place] (Optometry) [name], MD as Consulting Physician (General Surgery) Lorilynn Lehr, Virgie Dad, MD as Consulting Physician (Oncology) Eppie Gibson, MD as Attending Physician (Radiation Oncology) Mauro Kaufmann, RN as Registered Nurse Rockwell Germany, RN as Registered Nurse Jake Shark, Johny Blamer, NP as Nurse Practitioner (Nurse Practitioner) Ninetta Lights, MD as Consulting Physician (Orthopedic Surgery) PCP: [name], MD GYN: Everlene Farrier MD OTHER MD: Keturah Barre MD  CHIEF COMPLAINT: Estrogen receptor positive breast cancer  CURRENT TREATMENT: Letrozole, alendronate  BREAST CANCER HISTORY: From the original intake note:  "[name]" has a history of right breast cancer, status post right modified radical mastectomy in 1990. The patient tells me the tumor was "pretty day", and that they took 13 lymph nodes out. She does not know if the cancer was in the lymph nodes. She received no adjuvant therapy. She also has a history of uterine cancer in her 87s.  More recently, she had routine screening mammography September 2015 which showed a possible mass in the left breast. Diagnostic left mammography and left ultrasonography 04/11/2014 at the left breast showed an area of focal asymmetry measuring 8 mm in the left breast which was not palpable and not seen by ultrasonography.  Six-month follow-up was recommended and this was performed 10/24/2014, this time with tomography. The breast density was category C. There was an irregular subcentimeter mass in the left breast 8:00 position with coarse calcifications. This was not  palpable, but this time ultrasound showed a hypoechoic left breast mass at the area in question, measuring 0.5 cm.  Biopsy of the left breast mass 10/24/2014 showed ([id number]) an invasive ductal carcinoma, grade 1 or 2, estrogen receptor 100% positive, progesterone receptor 75% positive, both with strong staining intensity, with an MIB-1 of 11% and no HER-2 amplification, the signals ratio being 1.09 and the number per cell 1.80.  The patient's subsequent history is as detailed below  INTERVAL HISTORY: [name] returns today for follow-up of her estrogen receptor positive breast cancer accompanied by her husband [name]. She continues on letrozole, with good tolerance.   Hot flashes and vaginal dryness are not a major issue. She never developed the arthralgias or myalgias that many patients can experience on this medication. She obtains it at a good price.  Since she was found to be osteoporotic last year she was started on alendronate. She takes on Fridays, on an empty stomach, with a glass of water water, and doesn't eat anything for an hour afterwards. In short she is taking it correctly. She has no side effects from that but she is aware of   REVIEW OF SYSTEMS: [name] continues to have right upper extremity swelling, but she feels it hasn't changed. The left arm still hurt. She occasionally takes Aleve for that. She denies unusual headaches, visual changes, nausea, or vomiting. She lives a little because of her prior stroke, but actually is able to get around and was able to climb onto the examination table without difficulty today. A detailed review of systems was otherwise stable  PAST MEDICAL HISTORY: Past Medical History:  Diagnosis Date  . Allergic rhinitis   . ASCVD (arteriosclerotic cardiovascular disease)   . Atherosclerosis of  aorta ([place]) 2014   via CT AB/Pelvis- no AAA  . COPD (chronic obstructive pulmonary disease) ([place])   . DJD (degenerative joint disease)    neck, lower back, feet,  hands  . Elevated hemoglobin A1c measurement   . Tobacco abuse     PAST SURGICAL HISTORY: Past Surgical History:  Procedure Laterality Date  . ABDOMINAL HYSTERECTOMY  1976  . BACK SURGERY  2005   lower back  . bladder tact  1985  . BREAST LUMPECTOMY    . BREAST LUMPECTOMY WITH RADIOACTIVE SEED LOCALIZATION Left 11/30/2014   Procedure: LEFT BREAST LUMPECTOMY WITH RADIOACTIVE SEED LOCALIZATION;  Surgeon: [name], MD;  Location: [facility];  Service: General;  Laterality: Left;  . BREAST SURGERY  1990   rightmastectomy-axillary nodes  . CHOLECYSTECTOMY  2006  . CHOLECYSTECTOMY N/A 2008   Dr. Rosana Hoes  . excision of benign salivary tumor Left 1968  . EYE SURGERY     bilateral cataract eye surgery - now low vision  . LAPAROSCOPIC BILATERAL SALPINGO OOPHERECTOMY Bilateral 11/30/2013   Procedure: LAPAROSCOPIC RIGHT SALPINGO OOPHORECTOMY with extensive lysis of adhesions;  Surgeon: Allena Katz, MD;  Location: [facility];  Service: Gynecology;  Laterality: Bilateral;  . MASTECTOMY  1990   Right, no adjuvant  . MULTIPLE TOOTH EXTRACTIONS    . SHOULDER SURGERY Left 2007   Dr. Percell Miller    FAMILY HISTORY Family History  Problem Relation Age of Onset  . Heart disease Mother   . Cancer Mother        breast  . Emphysema Brother   . Emphysema Brother   . Emphysema Sister   . Breast cancer Sister   . Allergies Brother        all brothers and sisters  . Asthma Sister   . Breast cancer Sister   . Asthma Sister   . Heart disease Sister   . Heart disease Brother   . Cancer Sister        breast  . Colon cancer Neg Hx    the patient's father died from bladder cancer the age of 74. The patient's mother died from heart disease at the age of 79. The patient had 5 brothers and 7 sisters. The patient's mother and 3 of her sisters had breast cancer. There is no history of ovarian cancer in the family  GYNECOLOGIC HISTORY:  No LMP recorded. Patient is postmenopausal. Menarche age 43, first live  birth age 15. She is GX P2. She did not recall exactly when she went through menopause, but she underwent hysterectomy and unilateral salpingo-oophorectomy remotely. She never took hormone replacement. 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 She is very limited in what she can do at present. Her husband [name] is retired from Biomedical engineer. Their daughter [name] lives in [place] and is a health caregiver. Son [name] lives in [place] and works in home improvement. The patient has 5 grandchildren and 6 great-grandchildren. She is a Psychologist, forensic    ADVANCED DIRECTIVES: Not in place   HEALTH MAINTENANCE: Social History  Substance Use Topics  . Smoking status: Current Every Day Smoker    Packs/day: 1.00    Years: 55.00    Types: Cigarettes  . Smokeless tobacco: Current User     Comment: Electronic cig as well @ 0.'6mg'$  of e cig only  . Alcohol use No     Colonoscopy: November 2015  PAP: Status  post hysterectomy  Bone density: 06/26/2011, normal  Lipid panel:  Allergies  Allergen Reactions  . Bisoprolol Other (See Comments)    Decreased Heart Rate  . Ace Inhibitors Cough  . Acyclovir And Related Rash and Other (See Comments)    Okay to take famvir  . Amoxil [Amoxicillin] Diarrhea  . Ciprofloxacin Diarrhea  . Crestor [Rosuvastatin] Other (See Comments)    Elevated LFT's and Myalgias  . Doxycycline Diarrhea  . Enalapril Cough  . Lipitor [Atorvastatin] Other (See Comments)    Elevated LFT's and Myalgias  . Metformin And Related Diarrhea  . Prednisone Other (See Comments)    Intolerant to High Dose Prednisone  . Vicodin [Hydrocodone-Acetaminophen] Other (See Comments)    Increased Anxiety and Nervousness    Current Outpatient Prescriptions  Medication Sig Dispense Refill  . albuterol (PROVENTIL HFA;VENTOLIN HFA) 108 (90 BASE) MCG/ACT inhaler Inhale 2 puffs into the lungs every 6  (six) hours as needed for wheezing or shortness of breath.    Marland Kitchen alendronate (FOSAMAX) 70 MG tablet Take 1 tablet (70 mg total) by mouth every 7 (seven) days. Take with a full glass of water on an empty stomach. 12 tablet 3  . aspirin 81 MG tablet Take 81 mg by mouth daily.      . Cholecalciferol (VITAMIN D3) 2000 UNITS TABS Take 4,000 Units by mouth daily.     . fenofibrate micronized (LOFIBRA) 134 MG capsule TAKE ONE CAPSULE BY MOUTH ONCE DAILY 90 capsule 1  . fish oil-omega-3 fatty acids 1000 MG capsule Take 1 capsule by mouth 3 (three) times daily.     . Flaxseed, Linseed, (FLAX SEED OIL) 1000 MG CAPS Take 1,000 mg by mouth 3 (three) times daily.     . fluticasone (FLONASE) 50 MCG/ACT nasal spray Place 2 sprays into both nostrils daily. 16 g 1  . letrozole (FEMARA) 2.5 MG tablet Take 1 tablet (2.5 mg total) by mouth daily. 90 tablet 3  . losartan (COZAAR) 100 MG tablet TAKE ONE TABLET BY MOUTH ONCE DAILY 90 tablet 1  . Magnesium 500 MG TABS Take 500 mg by mouth 2 (two) times daily.     . meloxicam (MOBIC) 15 MG tablet Take one daily with food for 2 weeks, can take with tylenol, can not take with aleve, iburpofen, then as needed daily for pain 30 tablet 1  . metFORMIN (GLUCOPHAGE-XR) 500 MG 24 hr tablet TAKE FOUR TABLETS BY MOUTH ONCE DAILY 360 tablet 1  . tiotropium (SPIRIVA) 18 MCG inhalation capsule Place 18 mcg into inhaler and inhale daily as needed (for wheezing).      No current facility-administered medications for this visit.     OBJECTIVE: Older white woman In no acute distress  Vitals:   12/04/16 1234  BP: (!) 146/58  Pulse: 64  Resp: 19  Temp: 98.2 F (36.8 C)     Body mass index is 28.94 kg/m.    ECOG FS:1 - Symptomatic but completely ambulatory  Sclerae unicteric, pupils round and equal Oropharynx clear and moist No cervical or supraclavicular adenopathy Lungs no rales or rhonchi Heart regular rate and rhythm Abd soft, nontender, positive bowel sounds MSK no focal  spinal tenderness, chronic right grade 1 upper extremity lymphedema Neuro: Right leg AND RIGHT ue weakness as previously noted, well oriented, appropriate affect Breasts: The right breast is status post mastectomy, with no evidence of local recurrence. Left breast is status post lumpectomy with no evidence of local recurrence.. Both axillae are benign.  LAB  RESULTS:  CMP     Component Value Date/Time   NA 138 11/25/2016 1646   NA 141 10/31/2015 1449   K 4.2 11/25/2016 1646   K 4.1 10/31/2015 1449   CL 105 11/25/2016 1646   CO2 22 11/25/2016 1646   CO2 24 10/31/2015 1449   GLUCOSE 113 (H) 11/25/2016 1646   GLUCOSE 145 (H) 10/31/2015 1449   BUN 16 11/25/2016 1646   BUN 18.3 10/31/2015 1449   CREATININE 0.88 11/25/2016 1646   CREATININE 0.9 10/31/2015 1449   CALCIUM 10.1 11/25/2016 1646   CALCIUM 10.5 (H) 10/31/2015 1449   PROT 7.3 11/25/2016 1646   PROT 7.6 10/31/2015 1449   ALBUMIN 4.3 11/25/2016 1646   ALBUMIN 3.9 10/31/2015 1449   AST 35 11/25/2016 1646   AST 30 10/31/2015 1449   ALT 38 (H) 11/25/2016 1646   ALT 37 10/31/2015 1449   ALKPHOS 85 11/25/2016 1646   ALKPHOS 78 10/31/2015 1449   BILITOT 0.5 11/25/2016 1646   BILITOT 0.36 10/31/2015 1449   GFRNONAA 64 11/25/2016 1646   GFRAA 74 11/25/2016 1646    INo results found for: SPEP, UPEP  Lab Results  Component Value Date   WBC 6.4 12/04/2016   NEUTROABS 1.6 12/04/2016   HGB 13.5 12/04/2016   HCT 38.6 12/04/2016   MCV 95.8 12/04/2016   PLT 221 12/04/2016      Chemistry      Component Value Date/Time   NA 138 11/25/2016 1646   NA 141 10/31/2015 1449   K 4.2 11/25/2016 1646   K 4.1 10/31/2015 1449   CL 105 11/25/2016 1646   CO2 22 11/25/2016 1646   CO2 24 10/31/2015 1449   BUN 16 11/25/2016 1646   BUN 18.3 10/31/2015 1449   CREATININE 0.88 11/25/2016 1646   CREATININE 0.9 10/31/2015 1449      Component Value Date/Time   CALCIUM 10.1 11/25/2016 1646   CALCIUM 10.5 (H) 10/31/2015 1449   ALKPHOS 85  11/25/2016 1646   ALKPHOS 78 10/31/2015 1449   AST 35 11/25/2016 1646   AST 30 10/31/2015 1449   ALT 38 (H) 11/25/2016 1646   ALT 37 10/31/2015 1449   BILITOT 0.5 11/25/2016 1646   BILITOT 0.36 10/31/2015 1449       No results found for: LABCA2  No components found for: LABCA125  No results for input(s): INR in the last 168 hours.  Urinalysis    Component Value Date/Time   COLORURINE YELLOW 08/22/2016 [place] 08/22/2016 1514   LABSPEC 1.010 08/22/2016 1514   PHURINE 7.0 08/22/2016 1514   GLUCOSEU NEGATIVE 08/22/2016 1514   HGBUR NEGATIVE 08/22/2016 1514   BILIRUBINUR NEGATIVE 08/22/2016 1514   KETONESUR NEGATIVE 08/22/2016 1514   PROTEINUR NEGATIVE 08/22/2016 1514   UROBILINOGEN 0.2 07/05/2013 1658   NITRITE NEGATIVE 08/22/2016 1514   LEUKOCYTESUR TRACE (A) 08/22/2016 1514    STUDIES: Mammography at [facility] April 01/08/2017) left side only) found a breast density to be category C. There was no evidence of malignancy.  ASSESSMENT: 76 y.o. [place] woman status post left breast biopsy 10/24/2014 for a clinical T1 N0 invasive ductal carcinoma, grade 1 or 2, estrogen and progesterone receptor positive, HER-2 negative, with an MIB-1 of 11%  (1) status post left lumpectomy 11/30/2014 for a pT1b pNX, stage IA invasive ductal carcinoma, grade 1, again HER-2 negative, with negative margins  (2) anastrozole started 12/30/2014-- discontinued 03/31/2015 because of side effects  (a) exemestane prescribed 03/31/2015-- not started  because of cost issues  (b) letrozole started November 2016  (c) bone density December 2012 was normal  (d) bone density 02/27/2016 at the [facility] showed a T score of -3.0.  (e) alendronate started 02/27/2016  (3) if the patient proves unable to tolerate aromatase inhibitors, she still refuses radiation  (4) status post right modified radical mastectomy in 1994 for what was at least a stage II breast cancer, with no  adjuvant therapy given  (5) status post hysterectomy with unilateral salpingo-oophorectomy for endometrial cancer in the patient's 50s, no further data available  PLAN: Lee-Ann is now 2 years out from definitive surgery for her left breast cancer with no evidence of disease recurrence. This is very favorable.  She is tolerating letrozole well and the plan will be to continue that for a total of 5 years.  She does have osteoporosis. She is tolerating alendronate well. She will need a repeat bone density August or September 2019.  At this point I feel comfortable seeing her on a once a year basis. She knows to call for any problems that may develop before her next visit.   Chauncey Cruel, MD   12/04/2016 12:45 PM Medical Oncology and Hematology [facility] [address] [place], [place] [address] Tel. [phone]    Fax. [phone]

## 2016-12-17 DIAGNOSIS — M25562 Pain in left knee: Secondary | ICD-10-CM | POA: Diagnosis not present

## 2017-02-07 ENCOUNTER — Other Ambulatory Visit: Payer: Self-pay

## 2017-02-07 MED ORDER — MELOXICAM 15 MG PO TABS: ORAL_TABLET | ORAL | 1 refills | Status: DC

## 2017-03-03 ENCOUNTER — Ambulatory Visit: Payer: Self-pay | Admitting: Internal Medicine

## 2017-03-22 ENCOUNTER — Other Ambulatory Visit: Payer: Self-pay | Admitting: Internal Medicine

## 2017-03-25 ENCOUNTER — Other Ambulatory Visit: Payer: Self-pay | Admitting: *Deleted

## 2017-03-25 ENCOUNTER — Ambulatory Visit (INDEPENDENT_AMBULATORY_CARE_PROVIDER_SITE_OTHER): Payer: PPO | Admitting: Internal Medicine

## 2017-03-25 ENCOUNTER — Encounter: Payer: Self-pay | Admitting: Internal Medicine

## 2017-03-25 VITALS — BP 122/76 | HR 68 | Temp 97.5°F | Resp 18 | Ht 62.5 in | Wt 154.4 lb

## 2017-03-25 DIAGNOSIS — N183 Chronic kidney disease, stage 3 unspecified: Secondary | ICD-10-CM

## 2017-03-25 DIAGNOSIS — E559 Vitamin D deficiency, unspecified: Secondary | ICD-10-CM

## 2017-03-25 DIAGNOSIS — E1122 Type 2 diabetes mellitus with diabetic chronic kidney disease: Secondary | ICD-10-CM

## 2017-03-25 DIAGNOSIS — E782 Mixed hyperlipidemia: Secondary | ICD-10-CM

## 2017-03-25 DIAGNOSIS — I10 Essential (primary) hypertension: Secondary | ICD-10-CM

## 2017-03-25 DIAGNOSIS — Z79899 Other long term (current) drug therapy: Secondary | ICD-10-CM

## 2017-03-25 MED ORDER — FENOFIBRATE MICRONIZED 134 MG PO CAPS: 134.0000 mg | ORAL_CAPSULE | Freq: Every day | ORAL | 1 refills | Status: DC

## 2017-03-25 NOTE — Progress Notes (Signed)
This very nice 76 y.o. MWF presents for 6 month follow up with Hypertension, Hyperlipidemia, Pre-Diabetes and Vitamin D Deficiency.  Patient is followed by Dr [name] for hx/o Rt Mastectomy (1990) and Lt lumpectomy in 2016.  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     Patient is treated for HTN (1991)  & BP has been controlled at home. Today's BP is at goal - 122/76. 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     Hyperlipidemia is controlled with diet & meds. Patient denies myalgias or other med SE's. Last Lipids were at goal albeit sl elevated Trig's: Lab Results  Component Value Date   CHOL 165 11/25/2016   HDL 44 (L) 11/25/2016   LDLCALC 87 11/25/2016   TRIG 170 (H) 11/25/2016   CHOLHDL 3.8 11/25/2016      Also, the patient has history of T2_NIDDM (A1c 6.8% in 2009) and has had no symptoms of reactive hypoglycemia, diabetic polys, paresthesias or visual blurring.  Last A1c was not at goal: Lab Results  Component Value Date   HGBA1C 6.2 (H) 11/25/2016      Further, the patient also has history of Vitamin D Deficiency ("12" in 2009) and supplements vitamin D without any suspected side-effects. Last vitamin D was still low: Lab Results  Component Value Date   VD25OH 33 08/22/2016   Current Outpatient Prescriptions on File Prior to Visit  Medication Sig  . albuterol (PROVENTIL HFA;VENTOLIN HFA) 108 (90 BASE) MCG/ACT inhaler Inhale 2 puffs into the lungs every 6 (six) hours as needed for wheezing or shortness of breath.  Marland Kitchen aspirin 81 MG tablet Take 81 mg by mouth daily.    . Cholecalciferol (VITAMIN D3) 2000 UNITS TABS Take 4,000 Units by mouth daily.   . fish oil-omega-3 fatty acids 1000 MG capsule Take 1 capsule by mouth 3 (three) times daily.   . Flaxseed, Linseed, (FLAX SEED OIL) 1000 MG CAPS  Take 1,000 mg by mouth 3 (three) times daily.   . fluticasone (FLONASE) 50 MCG/ACT nasal spray Place 2 sprays into both nostrils daily.  Marland Kitchen losartan (COZAAR) 100 MG tablet TAKE ONE TABLET BY MOUTH ONCE DAILY  . Magnesium 500 MG TABS Take 500 mg by mouth 2 (two) times daily.   . metFORMIN (GLUCOPHAGE-XR) 500 MG 24 hr tablet TAKE FOUR TABLETS BY MOUTH ONCE DAILY  . letrozole (FEMARA) 2.5 MG tablet Take 1 tablet (2.5 mg total) by mouth daily. (Patient not taking: Reported on 03/25/2017)   No current facility-administered medications on file prior to visit.    Allergies  Allergen Reactions  . Bisoprolol Other (See Comments)    Decreased Heart Rate  . Ace Inhibitors Cough  . Acyclovir And Related Rash and Other (See Comments)    Okay to take famvir  . Amoxil [Amoxicillin] Diarrhea  . Ciprofloxacin Diarrhea  . Crestor [Rosuvastatin] Other (See Comments)    Elevated LFT's and Myalgias  . Doxycycline Diarrhea  . Enalapril Cough  . Lipitor [Atorvastatin] Other (See Comments)    Elevated LFT's and Myalgias  . Metformin And Related Diarrhea  . Prednisone Other (See Comments)    Intolerant to High Dose Prednisone  . Vicodin [Hydrocodone-Acetaminophen] Other (See Comments)    Increased Anxiety and Nervousness   PMHx:   Past Medical History:  Diagnosis Date  . Allergic  rhinitis   . ASCVD (arteriosclerotic cardiovascular disease)   . Atherosclerosis of aorta ([place]) 2014   via CT AB/Pelvis- no AAA  . COPD (chronic obstructive pulmonary disease) ([place])   . DJD (degenerative joint disease)    neck, lower back, feet, hands  . Elevated hemoglobin A1c measurement   . Tobacco abuse    Immunization History  Administered Date(s) Administered  . Hepatitis B, ped/adol 12/17/2013, 01/17/2014, 07/19/2014  . Influenza Split 05/17/2011, 04/21/2012  . Influenza Whole 04/23/2009, 04/13/2010  . Influenza, High Dose Seasonal PF 06/07/2014, 04/05/2015, 05/07/2016  . Influenza,inj,Quad PF,6+ Mos 04/06/2013    . PPD Test 06/07/2014  . Pneumococcal Conjugate-13 06/07/2014  . Pneumococcal Polysaccharide-23 05/17/2011  . Td 07/23/2007   Past Surgical History:  Procedure Laterality Date  . ABDOMINAL HYSTERECTOMY  1976  . BACK SURGERY  2005   lower back  . bladder tact  1985  . BREAST LUMPECTOMY    . BREAST LUMPECTOMY WITH RADIOACTIVE SEED LOCALIZATION Left 11/30/2014   Procedure: LEFT BREAST LUMPECTOMY WITH RADIOACTIVE SEED LOCALIZATION;  Surgeon: [name], MD;  Location: [facility];  Service: General;  Laterality: Left;  . BREAST SURGERY  1990   rightmastectomy-axillary nodes  . CHOLECYSTECTOMY  2006  . CHOLECYSTECTOMY N/A 2008   Dr. Rosana Hoes  . excision of benign salivary tumor Left 1968  . EYE SURGERY     bilateral cataract eye surgery - now low vision  . LAPAROSCOPIC BILATERAL SALPINGO OOPHERECTOMY Bilateral 11/30/2013   Procedure: LAPAROSCOPIC RIGHT SALPINGO OOPHORECTOMY with extensive lysis of adhesions;  Surgeon: Allena Katz, MD;  Location: [facility];  Service: Gynecology;  Laterality: Bilateral;  . MASTECTOMY  1990   Right, no adjuvant  . MULTIPLE TOOTH EXTRACTIONS    . SHOULDER SURGERY Left 2007   Dr. Percell Miller   FHx:    Reviewed / unchanged  SHx:    Reviewed / unchanged  Systems Review:  Constitutional: Denies fever, chills, wt changes, headaches, insomnia, fatigue, night sweats, change in appetite. Eyes: Denies redness, blurred vision, diplopia, discharge, itchy, watery eyes.  ENT: Denies discharge, congestion, post nasal drip, epistaxis, sore throat, earache, hearing loss, dental pain, tinnitus, vertigo, sinus pain, snoring.  CV: Denies chest pain, palpitations, irregular heartbeat, syncope, dyspnea, diaphoresis, orthopnea, PND, claudication or edema. Respiratory: denies cough, dyspnea, DOE, pleurisy, hoarseness, laryngitis, wheezing.  Gastrointestinal: Denies dysphagia, odynophagia, heartburn, reflux, water brash, abdominal pain or cramps, nausea, vomiting, bloating, diarrhea,  constipation, hematemesis, melena, hematochezia  or hemorrhoids. Genitourinary: Denies dysuria, frequency, urgency, nocturia, hesitancy, discharge, hematuria or flank pain. Musculoskeletal: Denies arthralgias, myalgias, stiffness, jt. swelling, pain, limping or strain/sprain.  Skin: Denies pruritus, rash, hives, warts, acne, eczema or change in skin lesion(s). Neuro: No weakness, tremor, incoordination, spasms, paresthesia or pain. Psychiatric: Denies confusion, memory loss or sensory loss. Endo: Denies change in weight, skin or hair change.  Heme/Lymph: No excessive bleeding, bruising or enlarged lymph nodes.  Physical Exam  BP 122/76   Pulse 68   Temp (!) 97.5 F (36.4 C)   Resp 18   Ht 5' 2.5" (1.588 m)   Wt 154 lb 6.4 oz (70 kg)   BMI 27.79 kg/m   Appears well nourished, well groomed  and in no distress.  Eyes: PERRLA, EOMs, conjunctiva no swelling or erythema. Sinuses: No frontal/maxillary tenderness ENT/Mouth: EAC's clear, TM's nl w/o erythema, bulging. Nares clear w/o erythema, swelling, exudates. Oropharynx clear without erythema or exudates. Oral hygiene is good. Tongue normal, non obstructing. Hearing intact.  Neck: Supple. Thyroid nl.  Car 2+/2+ without bruits, nodes or JVD. Chest: Respirations nl with BS clear & equal w/o rales, rhonchi, wheezing or stridor.  Cor: Heart sounds normal w/ regular rate and rhythm without sig. murmurs, gallops, clicks or rubs. Peripheral pulses normal and equal  without edema.  Abdomen: Soft & bowel sounds normal. Non-tender w/o guarding, rebound, hernias, masses or organomegaly.  Lymphatics: Unremarkable.  Musculoskeletal: Full ROM all peripheral extremities, joint stability, 5/5 strength and normal gait.  Skin: Warm, dry without exposed rashes, lesions or ecchymosis apparent.  Neuro: Cranial nerves intact, reflexes equal bilaterally. Sensory-motor testing grossly intact. Tendon reflexes grossly intact.  Pysch: Alert & oriented x 3.  Insight  and judgement nl & appropriate. No ideations.  Assessment and Plan:  1. Essential hypertension - Continue medication, monitor blood pressure at home.  - Continue DASH diet. Reminder to go to the ER if any CP,  SOB, nausea, dizziness, severe HA, changes vision/speech.  - CBC with Differential/Platelet - BASIC METABOLIC PANEL WITH GFR - Magnesium - TSH  2. Hyperlipidemia, mixed - Continue diet/meds, exercise,& lifestyle modifications.  - Continue monitor periodic cholesterol/liver & renal functions   - Hepatic function panel - Lipid panel - TSH  3. Type 2 diabetes mellitus with stage 3 chronic kidney disease, without long-term current use of insulin (HCC)  - Continue diet, exercise, lifestyle modifications.  - Monitor appropriate labs.  - Hemoglobin A1c - Insulin, fasting  4. Vitamin D deficiency  - Continue supplementation.  - VITAMIN D 25 Hydroxy  5. Medication management  - CBC with Differential/Platelet - BASIC METABOLIC PANEL WITH GFR - Hepatic function panel - Magnesium - Lipid panel - TSH - Hemoglobin A1c - Insulin, fasting - VITAMIN D 25 Hydroxy         Discussed  regular exercise, BP monitoring, weight control to achieve/maintain BMI less than 25 and discussed med and SE's. Recommended labs to assess and monitor clinical status with further disposition pending results of labs. Over 30 minutes of exam, counseling, chart review was performed.

## 2017-03-25 NOTE — Patient Instructions (Signed)

## 2017-03-26 LAB — BASIC METABOLIC PANEL WITH GFR
BUN: 16 mg/dL (ref 7–25)
CHLORIDE: 106 mmol/L (ref 98–110)
CO2: 24 mmol/L (ref 20–32)
Calcium: 10.3 mg/dL (ref 8.6–10.4)
Creat: 0.86 mg/dL (ref 0.60–0.93)
GFR, Est African American: 77 mL/min/{1.73_m2} (ref >=60)
GFR, Est Non African American: 66 mL/min/{1.73_m2} (ref >=60)
GLUCOSE: 110 mg/dL — AB (ref 65–99)
Potassium: 4.1 mmol/L (ref 3.5–5.3)
SODIUM: 139 mmol/L (ref 135–146)

## 2017-03-26 LAB — HEPATIC FUNCTION PANEL
AG Ratio: 1.5 (calc) (ref 1.0–2.5)
ALKALINE PHOSPHATASE (APISO): 70 U/L (ref 33–130)
ALT: 39 U/L — AB (ref 6–29)
AST: 33 U/L (ref 10–35)
Albumin: 4.4 g/dL (ref 3.6–5.1)
BILIRUBIN INDIRECT: 0.4 mg/dL (ref 0.2–1.2)
Bilirubin, Direct: 0.1 mg/dL (ref 0.0–0.2)
Globulin: 3 g/dL (calc) (ref 1.9–3.7)
TOTAL PROTEIN: 7.4 g/dL (ref 6.1–8.1)
Total Bilirubin: 0.5 mg/dL (ref 0.2–1.2)

## 2017-03-26 LAB — CBC WITH DIFFERENTIAL/PLATELET
BASOS ABS: 41 {cells}/uL (ref 0–200)
Basophils Relative: 0.6 %
Eosinophils Absolute: 193 cells/uL (ref 15–500)
Eosinophils Relative: 2.8 %
HCT: 41.4 % (ref 35.0–45.0)
HEMOGLOBIN: 14.6 g/dL (ref 11.7–15.5)
Lymphs Abs: 4016 cells/uL — ABNORMAL HIGH (ref 850–3900)
MCH: 32.9 pg (ref 27.0–33.0)
MCHC: 35.3 g/dL (ref 32.0–36.0)
MCV: 93.2 fL (ref 80.0–100.0)
MONOS PCT: 11.8 %
MPV: 10.5 fL (ref 7.5–12.5)
NEUTROS ABS: 1835 {cells}/uL (ref 1500–7800)
Neutrophils Relative %: 26.6 %
Platelets: 258 10*3/uL (ref 140–400)
RBC: 4.44 10*6/uL (ref 3.80–5.10)
RDW: 12.4 % (ref 11.0–15.0)
Total Lymphocyte: 58.2 %
WBC mixed population: 814 cells/uL (ref 200–950)
WBC: 6.9 10*3/uL (ref 3.8–10.8)

## 2017-03-26 LAB — LIPID PANEL
CHOLESTEROL: 207 mg/dL — AB (ref <200)
HDL: 34 mg/dL — ABNORMAL LOW (ref >50)
Non-HDL Cholesterol (Calc): 173 mg/dL (calc) — ABNORMAL HIGH (ref <130)
Total CHOL/HDL Ratio: 6.1 (calc) — ABNORMAL HIGH (ref <5.0)
Triglycerides: 441 mg/dL — ABNORMAL HIGH (ref <150)

## 2017-03-26 LAB — HEMOGLOBIN A1C
Hgb A1c MFr Bld: 6.5 % of total Hgb — ABNORMAL HIGH (ref <5.7)
MEAN PLASMA GLUCOSE: 140 (calc)
eAG (mmol/L): 7.7 (calc)

## 2017-03-26 LAB — VITAMIN D 25 HYDROXY (VIT D DEFICIENCY, FRACTURES): VIT D 25 HYDROXY: 23 ng/mL — AB (ref 30–100)

## 2017-03-26 LAB — MAGNESIUM: MAGNESIUM: 2.1 mg/dL (ref 1.5–2.5)

## 2017-03-26 LAB — INSULIN, FASTING: INSULIN: 38.3 u[IU]/mL — AB (ref 2.0–19.6)

## 2017-03-26 LAB — TSH: TSH: 3.24 m[IU]/L (ref 0.40–4.50)

## 2017-03-31 ENCOUNTER — Encounter: Payer: Self-pay | Admitting: Internal Medicine

## 2017-03-31 ENCOUNTER — Ambulatory Visit (INDEPENDENT_AMBULATORY_CARE_PROVIDER_SITE_OTHER): Payer: PPO | Admitting: Internal Medicine

## 2017-03-31 ENCOUNTER — Other Ambulatory Visit: Payer: Self-pay | Admitting: *Deleted

## 2017-03-31 VITALS — BP 126/68 | HR 72 | Temp 97.3°F | Resp 16 | Ht 62.5 in | Wt 154.4 lb

## 2017-03-31 DIAGNOSIS — K7469 Other cirrhosis of liver: Secondary | ICD-10-CM | POA: Diagnosis not present

## 2017-03-31 DIAGNOSIS — S80212A Abrasion, left knee, initial encounter: Secondary | ICD-10-CM

## 2017-03-31 DIAGNOSIS — S8002XA Contusion of left knee, initial encounter: Secondary | ICD-10-CM

## 2017-03-31 MED ORDER — ALENDRONATE SODIUM 70 MG PO TABS: 70.0000 mg | ORAL_TABLET | ORAL | 1 refills | Status: DC

## 2017-03-31 MED ORDER — MELOXICAM 15 MG PO TABS: 15.0000 mg | ORAL_TABLET | Freq: Every day | ORAL | 0 refills | Status: DC

## 2017-03-31 NOTE — Progress Notes (Signed)
Subjective:    Patient ID: [name], female    DOB: [birth year], 76 y.o.   MRN: [id number]  HPI  Patient presents w/ complaints of a recent fall after tripping at home on 09.05.2018 and has improved somewhat and persists with pain inferior to the Lt knee.   Medication Sig  . albuterol (PROVENTIL HFA;VENTOLIN HFA) 108 (90 BASE) MCG/ACT inhaler Inhale 2 puffs into the lungs every 6 (six) hours as needed for wheezing or shortness of breath.  Marland Kitchen alendronate (FOSAMAX) 70 MG tablet Take 1 tablet (70 mg total) by mouth once a week. Take with a full glass of water on an empty stomach.  Marland Kitchen aspirin 81 MG tablet Take 81 mg by mouth daily.    . Cholecalciferol (VITAMIN D3) 2000 UNITS TABS Take 4,000 Units by mouth daily.   . fenofibrate micronized (LOFIBRA) 134 MG capsule Take 1 capsule (134 mg total) by mouth daily.  . fish oil-omega-3 fatty acids 1000 MG capsule Take 1 capsule by mouth 3 (three) times daily.   . Flaxseed, Linseed, (FLAX SEED OIL) 1000 MG CAPS Take 1,000 mg by mouth 3 (three) times daily.   . fluticasone (FLONASE) 50 MCG/ACT nasal spray Place 2 sprays into both nostrils daily.  Marland Kitchen letrozole (FEMARA) 2.5 MG tablet Take 1 tablet (2.5 mg total) by mouth daily.  Marland Kitchen losartan (COZAAR) 100 MG tablet TAKE ONE TABLET BY MOUTH ONCE DAILY  . Magnesium 500 MG TABS Take 500 mg by mouth 2 (two) times daily.   . meloxicam (MOBIC) 15 MG tablet Take 1 tablet (15 mg total) by mouth daily.  . metFORMIN (GLUCOPHAGE-XR) 500 MG 24 hr tablet TAKE FOUR TABLETS BY MOUTH ONCE DAILY   No facility-administered medications prior to visit.    Allergies  Allergen Reactions  . Bisoprolol Other (See Comments)    Decreased Heart Rate  . Ace Inhibitors Cough  . Acyclovir And Related Rash and Other (See Comments)    Okay to take famvir  . Amoxil [Amoxicillin] Diarrhea  . Ciprofloxacin Diarrhea  . Crestor [Rosuvastatin] Other (See Comments)    Elevated LFT's and Myalgias  . Doxycycline Diarrhea  . Enalapril  Cough  . Lipitor [Atorvastatin] Other (See Comments)    Elevated LFT's and Myalgias  . Metformin And Related Diarrhea  . Prednisone Other (See Comments)    Intolerant to High Dose Prednisone  . Vicodin [Hydrocodone-Acetaminophen] Other (See Comments)    Increased Anxiety and Nervousness   Past Medical History:  Diagnosis Date  . Allergic rhinitis   . ASCVD (arteriosclerotic cardiovascular disease)   . Atherosclerosis of aorta ([place]) 2014   via CT AB/Pelvis- no AAA  . COPD (chronic obstructive pulmonary disease) ([place])   . DJD (degenerative joint disease)    neck, lower back, feet, hands  . Elevated hemoglobin A1c measurement   . Tobacco abuse    Review of Systems  10 point systems review negative except as above.    Objective:   Physical Exam  BP 126/68   Pulse 72   Temp (!) 97.3 F (36.3 C)   Resp 16   Ht 5' 2.5" (1.588 m)   Wt 154 lb 6.4 oz (70 kg)   BMI 27.79 kg/m   HEENT - WNL. Neck - supple.  Chest - Clear equal BS. Cor - Nl HS. RRR w/o sig MGR. PP 1(+). No edema. MS- FROM w/o deformities. Tenderness inferior to the Left patella with a 1" superficial abrasion. No effusion is evident and no crepitus. No  signs of cellulitis. Nl knee flexion / extention. Neuro -  Nl w/o focal abnormalities.    Assessment & Plan:   1. Contusion of left knee  2. Abrasion, knee, left  - recommended to continue cleansing with soap & water and apply antibiotic ung.   - Recc ROV if signs / sx's change.

## 2017-04-13 ENCOUNTER — Other Ambulatory Visit: Payer: Self-pay | Admitting: Internal Medicine

## 2017-04-14 ENCOUNTER — Other Ambulatory Visit: Payer: Self-pay | Admitting: *Deleted

## 2017-04-14 MED ORDER — METFORMIN HCL ER 500 MG PO TB24: ORAL_TABLET | ORAL | 1 refills | Status: DC

## 2017-04-29 ENCOUNTER — Ambulatory Visit (INDEPENDENT_AMBULATORY_CARE_PROVIDER_SITE_OTHER): Payer: PPO

## 2017-04-29 DIAGNOSIS — Z23 Encounter for immunization: Secondary | ICD-10-CM

## 2017-04-29 NOTE — Progress Notes (Signed)
Pt presents for HD FLU which was given in the left arm w/o issues.

## 2017-06-23 NOTE — Progress Notes (Signed)
FOLLOW UP  Assessment and Plan:   Hypertension Well controlled with current medications  Monitor blood pressure at home; patient to call if consistently greater than 130/80 Continue DASH diet.   Reminder to go to the ER if any CP, SOB, nausea, dizziness, severe HA, changes vision/speech, left arm numbness and tingling and jaw pain.  Cholesterol Continue fish oil/flax seed supplements, fenofibrate - not on statins secondary to elevated LFT Continue low cholesterol diet and exercise.  Check lipid panel.   Cirrhosis of liver (HCC) Avoid use of tylenol, ETOH; check LFTs  Diabetes with diabetic chronic kidney disease Continue medication: metformin 500 mg 2 tabs BID Continue diet and exercise.  Perform daily foot/skin check, notify office of any concerning changes.  Check A1C  Overweight Long discussion about weight loss, diet, and exercise Discussed ideal weight for height  Patient will work on increasing vegetables, continue to make good carb choices Will follow up in 3 months  Vitamin D Def/ osteoporosis prevention Continue supplementation as tolerated - she reports constipation at increased doses Check vitamin D levels  Smoking cessation-  instruction/counseling given, counseled patient on the dangers of tobacco use, advised patient to stop smoking, and reviewed strategies to maximize success, patient not ready to quit at this time.   Wrist pain Likely mild sprain of flexor group - Recommended wearing brace with lifting/at night if this is aggravating, voltaren gel sent in to stop taking ibuprofen so much  Continue diet and meds as discussed. Further disposition pending results of labs. Discussed med's effects and SE's.   Over 30 minutes of exam, counseling, chart review, and critical decision making was performed.   Future Appointments  Date Time Provider [place]  09/23/2017  2:00 PM [name], MD [facility] None  12/04/2017  1:30 PM [facility]  [facility] None  12/04/2017  2:00 PM Magrinat, Virgie Dad, MD [facility] None    ----------------------------------------------------------------------------------------------------------------------  HPI 76 y.o. female  presents for 3 month follow up on hypertension, cholesterol, diabetes, weight and vitamin D deficiency. Today she is complaining of some mild L wrist pain with flexion; no known injury, no swelling, no changes in sensation. She has been taking ibuprofen around the clock for this which is helping significantly.   she currently continues to smoke 0.5-1.0 pack a day - down from 2 packs; discussed risks associated with smoking, patient is not ready to quit.   BMI is Body mass index is 28.08 kg/m., she has been working on diet and exercise. Wt Readings from Last 3 Encounters:  06/24/17 156 lb (70.8 kg)  03/31/17 154 lb 6.4 oz (70 kg)  03/25/17 154 lb 6.4 oz (70 kg)   Her blood pressure has been controlled at home, today their BP is BP: 118/78  She does not workout. She denies chest pain, shortness of breath, dizziness.   She is on cholesterol medication and denies myalgias. Her cholesterol is not at goal. The cholesterol last visit was:   Lab Results  Component Value Date   CHOL 207 (H) 03/25/2017   HDL 34 (L) 03/25/2017   LDLCALC 87 11/25/2016   TRIG 441 (H) 03/25/2017   CHOLHDL 6.1 (H) 03/25/2017    She has been working on diet and exercise for type 2 diabetes, and denies nausea, paresthesia of the feet, polydipsia, polyuria, visual disturbances and vomiting. Last A1C in the office was:  Lab Results  Component Value Date   HGBA1C 6.5 (H) 03/25/2017   Patient is on Vitamin D supplement but  remains below goal:    Lab Results  Component Value Date   VD25OH 23 (L) 03/25/2017      Current Medications:  Current Outpatient Medications on File Prior to Visit  Medication Sig  . albuterol (PROVENTIL HFA;VENTOLIN HFA) 108 (90 BASE) MCG/ACT inhaler Inhale 2 puffs into the  lungs every 6 (six) hours as needed for wheezing or shortness of breath.  Marland Kitchen alendronate (FOSAMAX) 70 MG tablet Take 1 tablet (70 mg total) by mouth once a week. Take with a full glass of water on an empty stomach.  Marland Kitchen aspirin 81 MG tablet Take 81 mg by mouth daily.    . Cholecalciferol (VITAMIN D3) 2000 UNITS TABS Take 4,000 Units by mouth daily.   . fenofibrate micronized (LOFIBRA) 134 MG capsule Take 1 capsule (134 mg total) by mouth daily.  . fish oil-omega-3 fatty acids 1000 MG capsule Take 1 capsule by mouth 3 (three) times daily.   . Flaxseed, Linseed, (FLAX SEED OIL) 1000 MG CAPS Take 1,000 mg by mouth 3 (three) times daily.   . fluticasone (FLONASE) 50 MCG/ACT nasal spray Place 2 sprays into both nostrils daily.  Marland Kitchen letrozole (FEMARA) 2.5 MG tablet Take 1 tablet (2.5 mg total) by mouth daily.  Marland Kitchen losartan (COZAAR) 100 MG tablet TAKE 1 TABLET BY MOUTH ONCE DAILY  . Magnesium 500 MG TABS Take 500 mg by mouth 2 (two) times daily.   . meloxicam (MOBIC) 15 MG tablet Take 1 tablet (15 mg total) by mouth daily.  . metFORMIN (GLUCOPHAGE-XR) 500 MG 24 hr tablet TAKE FOUR TABLETS BY MOUTH ONCE DAILY   No current facility-administered medications on file prior to visit.      Allergies:  Allergies  Allergen Reactions  . Bisoprolol Other (See Comments)    Decreased Heart Rate  . Ace Inhibitors Cough  . Acyclovir And Related Rash and Other (See Comments)    Okay to take famvir  . Amoxil [Amoxicillin] Diarrhea  . Ciprofloxacin Diarrhea  . Crestor [Rosuvastatin] Other (See Comments)    Elevated LFT's and Myalgias  . Doxycycline Diarrhea  . Enalapril Cough  . Lipitor [Atorvastatin] Other (See Comments)    Elevated LFT's and Myalgias  . Metformin And Related Diarrhea  . Prednisone Other (See Comments)    Intolerant to High Dose Prednisone  . Vicodin [Hydrocodone-Acetaminophen] Other (See Comments)    Increased Anxiety and Nervousness     Medical History:  Past Medical History:   Diagnosis Date  . Allergic rhinitis   . ASCVD (arteriosclerotic cardiovascular disease)   . Atherosclerosis of aorta ([place]) 2014   via CT AB/Pelvis- no AAA  . COPD (chronic obstructive pulmonary disease) ([place])   . DJD (degenerative joint disease)    neck, lower back, feet, hands  . Elevated hemoglobin A1c measurement   . Tobacco abuse    Family history- Reviewed and unchanged Social history- Reviewed and unchanged   Review of Systems:  Review of Systems  Constitutional: Negative for malaise/fatigue and weight loss.  HENT: Negative for hearing loss and tinnitus.   Eyes: Negative for blurred vision and double vision.  Respiratory: Negative for cough, shortness of breath and wheezing.   Cardiovascular: Negative for chest pain, palpitations, orthopnea, claudication and leg swelling.  Gastrointestinal: Negative for abdominal pain, blood in stool, constipation, diarrhea, heartburn, melena, nausea and vomiting.  Genitourinary: Negative.   Musculoskeletal: Negative for joint pain and myalgias.  Skin: Negative for rash.  Neurological: Negative for dizziness, tingling, sensory change, weakness and headaches.  Endo/Heme/Allergies:  Negative for polydipsia.  Psychiatric/Behavioral: Negative.   All other systems reviewed and are negative.   Physical Exam: BP 118/78   Pulse 74   Temp 97.7 F (36.5 C)   Ht 5' 2.5" (1.588 m)   Wt 156 lb (70.8 kg)   SpO2 97%   BMI 28.08 kg/m  Wt Readings from Last 3 Encounters:  06/24/17 156 lb (70.8 kg)  03/31/17 154 lb 6.4 oz (70 kg)  03/25/17 154 lb 6.4 oz (70 kg)   General Appearance: Well nourished, in no apparent distress. Eyes: Legally blind bilateral eyes, conjunctiva no swelling or erythema Sinuses: No Frontal/maxillary tenderness ENT/Mouth: Ext aud canals clear, TMs without erythema, bulging. No erythema, swelling, or exudate on post pharynx.  Tonsils not swollen or erythematous. Hearing normal.  Neck: Supple, thyroid normal.   Respiratory: Respiratory effort normal, BS equal bilaterally without rales, rhonchi, wheezing or stridor.  Cardio: RRR with no MRGs. Brisk peripheral pulses without edema.  Abdomen: Soft, + BS.  Non tender, no guarding, rebound, hernias, masses. Lymphatics: Non tender without lymphadenopathy.  Musculoskeletal: Full ROM, 5/5 strength, Normal gait Skin: Warm, dry without rashes, lesions, ecchymosis.  Neuro: Cranial nerves intact. No cerebellar symptoms.  Psych: Awake and oriented X 3, normal affect, Insight and Judgment appropriate.    Izora Ribas, NP 4:13 PM [facility]

## 2017-06-24 ENCOUNTER — Ambulatory Visit: Payer: PPO | Admitting: Adult Health

## 2017-06-24 ENCOUNTER — Encounter: Payer: Self-pay | Admitting: Adult Health

## 2017-06-24 VITALS — BP 118/78 | HR 74 | Temp 97.7°F | Ht 62.5 in | Wt 156.0 lb

## 2017-06-24 DIAGNOSIS — Z79899 Other long term (current) drug therapy: Secondary | ICD-10-CM

## 2017-06-24 DIAGNOSIS — E1122 Type 2 diabetes mellitus with diabetic chronic kidney disease: Secondary | ICD-10-CM

## 2017-06-24 DIAGNOSIS — E559 Vitamin D deficiency, unspecified: Secondary | ICD-10-CM

## 2017-06-24 DIAGNOSIS — M25532 Pain in left wrist: Secondary | ICD-10-CM

## 2017-06-24 DIAGNOSIS — I10 Essential (primary) hypertension: Secondary | ICD-10-CM

## 2017-06-24 DIAGNOSIS — E782 Mixed hyperlipidemia: Secondary | ICD-10-CM

## 2017-06-24 DIAGNOSIS — J302 Other seasonal allergic rhinitis: Secondary | ICD-10-CM | POA: Diagnosis not present

## 2017-06-24 DIAGNOSIS — N182 Chronic kidney disease, stage 2 (mild): Secondary | ICD-10-CM

## 2017-06-24 DIAGNOSIS — K7469 Other cirrhosis of liver: Secondary | ICD-10-CM

## 2017-06-24 DIAGNOSIS — F17200 Nicotine dependence, unspecified, uncomplicated: Secondary | ICD-10-CM | POA: Diagnosis not present

## 2017-06-24 MED ORDER — DICLOFENAC SODIUM 1 % TD GEL: 4.0000 g | Freq: Four times a day (QID) | TRANSDERMAL | 3 refills | Status: DC

## 2017-06-24 MED ORDER — FLUTICASONE PROPIONATE 50 MCG/ACT NA SUSP: 2.0000 | Freq: Every day | NASAL | 1 refills | Status: AC

## 2017-06-24 NOTE — Patient Instructions (Signed)

## 2017-06-25 LAB — BASIC METABOLIC PANEL WITH GFR
BUN: 21 mg/dL (ref 7–25)
CHLORIDE: 103 mmol/L (ref 98–110)
CO2: 25 mmol/L (ref 20–32)
CREATININE: 0.85 mg/dL (ref 0.60–0.93)
Calcium: 10.1 mg/dL (ref 8.6–10.4)
GFR, Est African American: 77 mL/min/{1.73_m2} (ref >=60)
GFR, Est Non African American: 67 mL/min/{1.73_m2} (ref >=60)
GLUCOSE: 129 mg/dL — AB (ref 65–99)
Potassium: 4.4 mmol/L (ref 3.5–5.3)
SODIUM: 135 mmol/L (ref 135–146)

## 2017-06-25 LAB — CBC WITH DIFFERENTIAL/PLATELET
BASOS ABS: 32 {cells}/uL (ref 0–200)
BASOS PCT: 0.5 %
EOS ABS: 170 {cells}/uL (ref 15–500)
Eosinophils Relative: 2.7 %
HCT: 40.2 % (ref 35.0–45.0)
Hemoglobin: 14.1 g/dL (ref 11.7–15.5)
Lymphs Abs: 3862 cells/uL (ref 850–3900)
MCH: 31.8 pg (ref 27.0–33.0)
MCHC: 35.1 g/dL (ref 32.0–36.0)
MCV: 90.7 fL (ref 80.0–100.0)
MONOS PCT: 10.9 %
MPV: 10.5 fL (ref 7.5–12.5)
Neutro Abs: 1550 cells/uL (ref 1500–7800)
Neutrophils Relative %: 24.6 %
PLATELETS: 252 10*3/uL (ref 140–400)
RBC: 4.43 10*6/uL (ref 3.80–5.10)
RDW: 12.2 % (ref 11.0–15.0)
TOTAL LYMPHOCYTE: 61.3 %
WBC: 6.3 10*3/uL (ref 3.8–10.8)
WBCMIX: 687 {cells}/uL (ref 200–950)

## 2017-06-25 LAB — LIPID PANEL
Cholesterol: 185 mg/dL (ref <200)
HDL: 33 mg/dL — ABNORMAL LOW (ref >50)
LDL Cholesterol (Calc): 99 mg/dL (calc)
NON-HDL CHOLESTEROL (CALC): 152 mg/dL — AB (ref <130)
Total CHOL/HDL Ratio: 5.6 (calc) — ABNORMAL HIGH (ref <5.0)
Triglycerides: 394 mg/dL — ABNORMAL HIGH (ref <150)

## 2017-06-25 LAB — HEMOGLOBIN A1C
Hgb A1c MFr Bld: 6.6 % of total Hgb — ABNORMAL HIGH (ref <5.7)
Mean Plasma Glucose: 143 (calc)
eAG (mmol/L): 7.9 (calc)

## 2017-06-25 LAB — HEPATIC FUNCTION PANEL
AG RATIO: 1.3 (calc) (ref 1.0–2.5)
ALKALINE PHOSPHATASE (APISO): 86 U/L (ref 33–130)
ALT: 37 U/L — ABNORMAL HIGH (ref 6–29)
AST: 35 U/L (ref 10–35)
Albumin: 4.3 g/dL (ref 3.6–5.1)
BILIRUBIN DIRECT: 0.1 mg/dL (ref 0.0–0.2)
BILIRUBIN INDIRECT: 0.4 mg/dL (ref 0.2–1.2)
GLOBULIN: 3.4 g/dL (ref 1.9–3.7)
TOTAL PROTEIN: 7.7 g/dL (ref 6.1–8.1)
Total Bilirubin: 0.5 mg/dL (ref 0.2–1.2)

## 2017-06-25 LAB — VITAMIN D 25 HYDROXY (VIT D DEFICIENCY, FRACTURES): VIT D 25 HYDROXY: 22 ng/mL — AB (ref 30–100)

## 2017-06-25 LAB — TSH: TSH: 3.44 m[IU]/L (ref 0.40–4.50)

## 2017-09-18 ENCOUNTER — Other Ambulatory Visit: Payer: Self-pay | Admitting: Oncology

## 2017-09-18 DIAGNOSIS — Z853 Personal history of malignant neoplasm of breast: Secondary | ICD-10-CM

## 2017-09-23 ENCOUNTER — Encounter: Payer: Self-pay | Admitting: Internal Medicine

## 2017-09-23 ENCOUNTER — Ambulatory Visit (INDEPENDENT_AMBULATORY_CARE_PROVIDER_SITE_OTHER): Payer: PPO | Admitting: Internal Medicine

## 2017-09-23 VITALS — BP 138/84 | HR 64 | Temp 97.6°F | Resp 16 | Ht 63.25 in | Wt 154.8 lb

## 2017-09-23 DIAGNOSIS — M159 Polyosteoarthritis, unspecified: Secondary | ICD-10-CM

## 2017-09-23 DIAGNOSIS — H548 Legal blindness, as defined in USA: Secondary | ICD-10-CM

## 2017-09-23 DIAGNOSIS — I6789 Other cerebrovascular disease: Secondary | ICD-10-CM

## 2017-09-23 DIAGNOSIS — Z853 Personal history of malignant neoplasm of breast: Secondary | ICD-10-CM | POA: Diagnosis not present

## 2017-09-23 DIAGNOSIS — I2510 Atherosclerotic heart disease of native coronary artery without angina pectoris: Secondary | ICD-10-CM

## 2017-09-23 DIAGNOSIS — I700 Atherosclerosis of aorta: Secondary | ICD-10-CM

## 2017-09-23 DIAGNOSIS — E559 Vitamin D deficiency, unspecified: Secondary | ICD-10-CM | POA: Diagnosis not present

## 2017-09-23 DIAGNOSIS — Z79899 Other long term (current) drug therapy: Secondary | ICD-10-CM

## 2017-09-23 DIAGNOSIS — Z0001 Encounter for general adult medical examination with abnormal findings: Secondary | ICD-10-CM

## 2017-09-23 DIAGNOSIS — Z136 Encounter for screening for cardiovascular disorders: Secondary | ICD-10-CM

## 2017-09-23 DIAGNOSIS — Z0000 Encounter for general adult medical examination without abnormal findings: Secondary | ICD-10-CM

## 2017-09-23 DIAGNOSIS — Z1211 Encounter for screening for malignant neoplasm of colon: Secondary | ICD-10-CM

## 2017-09-23 DIAGNOSIS — E1122 Type 2 diabetes mellitus with diabetic chronic kidney disease: Secondary | ICD-10-CM

## 2017-09-23 DIAGNOSIS — I10 Essential (primary) hypertension: Secondary | ICD-10-CM

## 2017-09-23 DIAGNOSIS — N182 Chronic kidney disease, stage 2 (mild): Secondary | ICD-10-CM

## 2017-09-23 DIAGNOSIS — M150 Primary generalized (osteo)arthritis: Secondary | ICD-10-CM

## 2017-09-23 DIAGNOSIS — E782 Mixed hyperlipidemia: Secondary | ICD-10-CM

## 2017-09-23 DIAGNOSIS — Z1212 Encounter for screening for malignant neoplasm of rectum: Secondary | ICD-10-CM

## 2017-09-23 DIAGNOSIS — J449 Chronic obstructive pulmonary disease, unspecified: Secondary | ICD-10-CM | POA: Diagnosis not present

## 2017-09-23 NOTE — Progress Notes (Signed)
[facility] [name], M.D.     [name]. [name], P.A.-C Liane Comber, [place] [address] [place], [place] [address] Telephone [phone] Telefax [phone] Annual Screening/Preventative Visit & Comprehensive Evaluation &  Examination     This very nice 77 y.o. MWF presents for a Screening/Preventative Visit & comprehensive evaluation and management of multiple medical co-morbidities.  Patient has been followed for HTN, HLD, T2_NIDDM and Vitamin D Deficiency. In 1990 , [name] underwent a Rt Mastectomy and then in 2016 had a Lt Lumpectomy and [name] followed also by Dr [name].      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  [name] cannot see well enough to read but does cook at home.       HTN predates since 42. Patient's BP has been controlled at home and patient denies any cardiac symptoms as chest pain, palpitations, shortness of breath, dizziness or ankle swelling. 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 Today's BP was initially sl elevated & rechecked at goal - 140/84.      Patient's hyperlipidemia is controlled with diet and medications. Patient denies myalgias or other medication SE's. Last lipids were at goal albeit elevated Trig's:  Lab Results  Component Value Date   CHOL 185 06/24/2017   HDL 33 (L) 06/24/2017   LDLCALC 87 11/25/2016   TRIG 394 (H) 06/24/2017   CHOLHDL 5.6 (H) 06/24/2017      Patient has hx/o Pre Dm since 2009 and then T2_NIDDM (2012)  w/CKD2 (GFR 67) and patient denies reactive hypoglycemic symptoms, visual blurring, diabetic polys, or paresthesias. [name] does admit poor dietary compliance and last A1c was not at goal: Lab Results  Component Value Date   HGBA1C 6.6 (H) 06/24/2017      Finally, patient has history of Vitamin D Deficiency ("12"/2009) and last Vitamin D was still very low : Lab Results  Component Value Date   VD25OH 22 (L)  06/24/2017   Current Outpatient Medications on File Prior to Visit  Medication Sig  . albuterol (PROVENTIL HFA;VENTOLIN HFA) 108 (90 BASE) MCG/ACT inhaler Inhale 2 puffs into the lungs every 6 (six) hours as needed for wheezing or shortness of breath.  Marland Kitchen alendronate (FOSAMAX) 70 MG tablet Take 1 tablet (70 mg total) by mouth once a week. Take with a full glass of water on an empty stomach.  Marland Kitchen aspirin 81 MG tablet Take 81 mg by mouth daily.    . Cholecalciferol (VITAMIN D3) 2000 UNITS TABS Take 4,000 Units by mouth daily.   . diclofenac sodium (VOLTAREN) 1 % GEL Apply 4 g topically 4 (four) times daily.  . fenofibrate micronized (LOFIBRA) 134 MG capsule Take 1 capsule (134 mg total) by mouth daily.  . fish oil-omega-3 fatty acids 1000 MG capsule Take 1 capsule by mouth 3 (three) times daily.   . Flaxseed, Linseed, (FLAX SEED OIL) 1000 MG CAPS Take 1,000 mg by mouth 3 (three) times daily.   . fluticasone (FLONASE) 50 MCG/ACT nasal spray Place 2 sprays into both nostrils daily.  Marland Kitchen losartan (COZAAR) 100 MG tablet TAKE 1 TABLET BY MOUTH ONCE DAILY  . Magnesium 500 MG TABS Take 500 mg by mouth 2 (two) times daily.   . metFORMIN (GLUCOPHAGE-XR) 500 MG 24 hr tablet TAKE FOUR TABLETS BY MOUTH ONCE DAILY  . letrozole (FEMARA) 2.5 MG tablet Take 1 tablet (2.5 mg total) by mouth daily. (Patient  not taking: Reported on 09/23/2017)   No current facility-administered medications on file prior to visit.    Allergies  Allergen Reactions  . Bisoprolol Other (See Comments)    Decreased Heart Rate  . Ace Inhibitors Cough  . Acyclovir And Related Rash and Other (See Comments)    Okay to take famvir  . Amoxil [Amoxicillin] Diarrhea  . Ciprofloxacin Diarrhea  . Crestor [Rosuvastatin] Other (See Comments)    Elevated LFT's and Myalgias  . Doxycycline Diarrhea  . Enalapril Cough  . Lipitor [Atorvastatin] Other (See Comments)    Elevated LFT's and Myalgias  . Metformin And Related Diarrhea  . Prednisone  Other (See Comments)    Intolerant to High Dose Prednisone  . Vicodin [Hydrocodone-Acetaminophen] Other (See Comments)    Increased Anxiety and Nervousness   Past Medical History:  Diagnosis Date  . Allergic rhinitis   . ASCVD (arteriosclerotic cardiovascular disease)   . Atherosclerosis of aorta ([place]) 2014   via CT AB/Pelvis- no AAA  . COPD (chronic obstructive pulmonary disease) ([place])   . DJD (degenerative joint disease)    neck, lower back, feet, hands  . Elevated hemoglobin A1c measurement   . Tobacco abuse    Health Maintenance  Topic Date Due  . OPHTHALMOLOGY EXAM  09/25/2016  . TETANUS/TDAP  07/22/2017  . FOOT EXAM  08/22/2017  . HEMOGLOBIN A1C  12/23/2017  . INFLUENZA VACCINE  Completed  . DEXA SCAN  Completed  . PNA vac Low Risk Adult  Completed   Immunization History  Administered Date(s) Administered  . Hepatitis B, ped/adol 12/17/2013, 01/17/2014, 07/19/2014  . Influenza Split 05/17/2011, 04/21/2012  . Influenza Whole 04/23/2009, 04/13/2010  . Influenza, High Dose Seasonal PF 06/07/2014, 04/05/2015, 05/07/2016, 04/29/2017  . Influenza,inj,Quad PF,6+ Mos 04/06/2013  . PPD Test 06/07/2014  . Pneumococcal Conjugate-13 06/07/2014  . Pneumococcal Polysaccharide-23 05/17/2011  . Td 07/23/2007   Last Colon -  Last MGM -  Past Surgical History:  Procedure Laterality Date  . ABDOMINAL HYSTERECTOMY  1976  . BACK SURGERY  2005   lower back  . bladder tact  1985  . BREAST LUMPECTOMY    . BREAST LUMPECTOMY WITH RADIOACTIVE SEED LOCALIZATION Left 11/30/2014   Procedure: LEFT BREAST LUMPECTOMY WITH RADIOACTIVE SEED LOCALIZATION;  Surgeon: [name], MD;  Location: [facility];  Service: General;  Laterality: Left;  . BREAST SURGERY  1990   rightmastectomy-axillary nodes  . CHOLECYSTECTOMY  2006  . CHOLECYSTECTOMY N/A 2008   Dr. Rosana Hoes  . excision of benign salivary tumor Left 1968  . EYE SURGERY     bilateral cataract eye surgery - now low vision  . LAPAROSCOPIC  BILATERAL SALPINGO OOPHERECTOMY Bilateral 11/30/2013   Procedure: LAPAROSCOPIC RIGHT SALPINGO OOPHORECTOMY with extensive lysis of adhesions;  Surgeon: Allena Katz, MD;  Location: [facility];  Service: Gynecology;  Laterality: Bilateral;  . MASTECTOMY  1990   Right, no adjuvant  . MULTIPLE TOOTH EXTRACTIONS    . SHOULDER SURGERY Left 2007   Dr. Percell Miller   Family History  Problem Relation Age of Onset  . Heart disease Mother   . Cancer Mother        breast  . Emphysema Brother   . Emphysema Brother   . Emphysema Sister   . Breast cancer Sister   . Allergies Brother        all brothers and sisters  . Asthma Sister   . Breast cancer Sister   . Asthma Sister   . Heart disease  Sister   . Heart disease Brother   . Cancer Sister        breast  . Colon cancer Neg Hx    Social History   Tobacco Use  . Smoking status: Current Every Day Smoker    Packs/day: 0.75    Years: 55.00    Pack years: 41.25    Types: Cigarettes  . Smokeless tobacco: Current User  . Tobacco comment: Electronic cig as well @ 0.6mg  of e cig only  Substance Use Topics  . Alcohol use: No  . Drug use: No    ROS Constitutional: Denies fever, chills, weight loss/gain, headaches, insomnia,  night sweats, and change in appetite. Does c/o fatigue. Eyes: Denies redness, blurred vision, diplopia, discharge, itchy, watery eyes.  ENT: Denies discharge, congestion, post nasal drip, epistaxis, sore throat, earache, hearing loss, dental pain, Tinnitus, Vertigo, Sinus pain, snoring.  Cardio: Denies chest pain, palpitations, irregular heartbeat, syncope, dyspnea, diaphoresis, orthopnea, PND, claudication, edema Respiratory: denies cough, dyspnea, DOE, pleurisy, hoarseness, laryngitis, wheezing.  Gastrointestinal: Denies dysphagia, heartburn, reflux, water brash, pain, cramps, nausea, vomiting, bloating, diarrhea, constipation, hematemesis, melena, hematochezia, jaundice, hemorrhoids Genitourinary: Denies dysuria, frequency,  urgency, nocturia, hesitancy, discharge, hematuria, flank pain Breast: Breast lumps, nipple discharge, bleeding.  Musculoskeletal: Denies arthralgia, myalgia, stiffness, Jt. Swelling, pain, limp, and strain/sprain. Denies falls. Skin: Denies puritis, rash, hives, warts, acne, eczema, changing in skin lesion Neuro: No weakness, tremor, incoordination, spasms, paresthesia, pain Psychiatric: Denies confusion, memory loss, sensory loss. Denies Depression. Endocrine: Denies change in weight, skin, hair change, nocturia, and paresthesia, diabetic polys, visual blurring, hyper / hypo glycemic episodes.  Heme/Lymph: No excessive bleeding, bruising, enlarged lymph nodes.  Physical Exam  BP 140/84   Pulse 64   Temp 97.6 F (36.4 C)   Resp 16   Ht 5' 3.25" (1.607 m)   Wt 154 lb 12.8 oz (70.2 kg)   BMI 27.21 kg/m   General Appearance: Well nourished, well groomed and in no apparent distress.  Eyes: PERRLA, EOMs, conjunctiva no swelling or erythema &  fundi not well visualized. Vision decreased to finger counting.  Sinuses: No frontal/maxillary tenderness ENT/Mouth: EACs patent / TMs  nl. Nares clear without erythema, swelling, mucoid exudates. Oral hygiene is good. No erythema, swelling, or exudate. Tongue normal, non-obstructing. Tonsils not swollen or erythematous. Hearing normal.  Neck: Supple, thyroid not palpable. No bruits, nodes or JVD. Respiratory: Respiratory effort normal.  BS equal and clear bilateral without rales, rhonci, wheezing or stridor. Cardio: Heart sounds are normal with regular rate and rhythm and no murmurs, rubs or gallops. Peripheral pulses are normal and equal bilaterally without edema. No aortic or femoral bruits. Chest: symmetric with normal excursions and percussion. Breasts: Rt Breast surgically absent w/o suspect sign of skin recurrence and Left Breast is without lumps, nipple discharge, retractions, or fibrocystic changes.  Abdomen: Flat, soft with bowel sounds  active. Nontender, no guarding, rebound, hernias, masses, or organomegaly.  Lymphatics: Non tender without lymphadenopathy.  Musculoskeletal: Mild spastic Rt Hemiparesis and limping gait. Skin: Warm and dry without rashes, lesions, cyanosis, clubbing or  ecchymosis.  Neuro: Cranial nerves intact, reflexes equal bilaterally. No cerebellar symptoms. Sensation decreased to touch, vibratory and Monofilament in a stocking distribution to the feet bilaterally. Pysch: Alert and oriented X 3, normal affect, Insight and Judgment appropriate.   Assessment and Plan  1. Annual Preventative Screening Examination  2. Essential hypertension  - EKG 12-Lead - Urinalysis, Routine w reflex microscopic - Microalbumin / creatinine urine ratio - CBC  with Differential/Platelet - BASIC METABOLIC PANEL WITH GFR - Magnesium - TSH  3. Hyperlipidemia, mixed  - EKG 12-Lead - HM DIABETES FOOT EXAM - Hepatic function panel - Lipid panel - TSH  4. Type 2 diabetes mellitus with stage 2 chronic kidney disease, without long-term current use of insulin (HCC)  - EKG 12-Lead - HM DIABETES FOOT EXAM - LOW EXTREMITY NEUR EXAM DOCUM - Hemoglobin A1c - Insulin, random  5. Vitamin D deficiency  - VITAMIN D 25 Hydroxyl  6. 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 ASCVD (arteriosclerotic cardiovascular disease)  - EKG 12-Lead  8. COPD mixed type ([place])   9. Primary osteoarthritis   10. Aortic atherosclerosis (HCC)  - EKG 12-Lead  11. 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 History of breast cancer   13. Screening for colorectal cancer  - POC Hemoccult Bld/Stl  14. Screening for ischemic heart disease  - EKG 12-Lead  15. Medication management  - Urinalysis, Routine w reflex microscopic - Microalbumin / creatinine urine ratio - CBC with Differential/Platelet - BASIC METABOLIC PANEL WITH GFR - Hepatic function panel - Magnesium - Lipid panel - TSH - Hemoglobin A1c - Insulin, random - VITAMIN D 25  Hydroxyl        Patient was counseled in prudent diet to achieve/maintain BMI less than 25 for weight control, BP monitoring, regular exercise and medications. Discussed med's effects and SE's. Screening labs and tests as requested with regular follow-up as recommended. Over 40 minutes of exam, counseling, chart review and high complex critical decision making was performed.

## 2017-09-23 NOTE — Patient Instructions (Signed)

## 2017-09-24 LAB — MICROALBUMIN / CREATININE URINE RATIO
CREATININE, URINE: 96 mg/dL (ref 20–275)
MICROALB UR: 0.6 mg/dL
MICROALB/CREAT RATIO: 6 ug/mg{creat} (ref <30)

## 2017-09-24 LAB — URINALYSIS, ROUTINE W REFLEX MICROSCOPIC
BACTERIA UA: NONE SEEN /HPF
Bilirubin Urine: NEGATIVE
HGB URINE DIPSTICK: NEGATIVE
HYALINE CAST: NONE SEEN /LPF
Ketones, ur: NEGATIVE
Nitrite: NEGATIVE
PROTEIN: NEGATIVE
RBC / HPF: NONE SEEN /HPF (ref 0–2)
Specific Gravity, Urine: 1.017 (ref 1.001–1.03)
pH: 5.5 (ref 5.0–8.0)

## 2017-09-24 LAB — HEPATIC FUNCTION PANEL
AG Ratio: 1.4 (calc) (ref 1.0–2.5)
ALKALINE PHOSPHATASE (APISO): 86 U/L (ref 33–130)
ALT: 31 U/L — ABNORMAL HIGH (ref 6–29)
AST: 27 U/L (ref 10–35)
Albumin: 4.5 g/dL (ref 3.6–5.1)
BILIRUBIN DIRECT: 0.1 mg/dL (ref 0.0–0.2)
BILIRUBIN INDIRECT: 0.4 mg/dL (ref 0.2–1.2)
GLOBULIN: 3.3 g/dL (ref 1.9–3.7)
Total Bilirubin: 0.5 mg/dL (ref 0.2–1.2)
Total Protein: 7.8 g/dL (ref 6.1–8.1)

## 2017-09-24 LAB — CBC WITH DIFFERENTIAL/PLATELET
BASOS ABS: 41 {cells}/uL (ref 0–200)
Basophils Relative: 0.6 %
EOS ABS: 197 {cells}/uL (ref 15–500)
Eosinophils Relative: 2.9 %
HCT: 41.2 % (ref 35.0–45.0)
Hemoglobin: 14.5 g/dL (ref 11.7–15.5)
Lymphs Abs: 4542 cells/uL — ABNORMAL HIGH (ref 850–3900)
MCH: 31.9 pg (ref 27.0–33.0)
MCHC: 35.2 g/dL (ref 32.0–36.0)
MCV: 90.5 fL (ref 80.0–100.0)
MPV: 10.4 fL (ref 7.5–12.5)
Monocytes Relative: 10.1 %
Neutro Abs: 1333 cells/uL — ABNORMAL LOW (ref 1500–7800)
Neutrophils Relative %: 19.6 %
PLATELETS: 265 10*3/uL (ref 140–400)
RBC: 4.55 10*6/uL (ref 3.80–5.10)
RDW: 12.7 % (ref 11.0–15.0)
TOTAL LYMPHOCYTE: 66.8 %
WBC: 6.8 10*3/uL (ref 3.8–10.8)
WBCMIX: 687 {cells}/uL (ref 200–950)

## 2017-09-24 LAB — LIPID PANEL
CHOL/HDL RATIO: 4.1 (calc) (ref <5.0)
Cholesterol: 170 mg/dL (ref <200)
HDL: 41 mg/dL — ABNORMAL LOW (ref >50)
LDL CHOLESTEROL (CALC): 94 mg/dL
NON-HDL CHOLESTEROL (CALC): 129 mg/dL (ref <130)
Triglycerides: 265 mg/dL — ABNORMAL HIGH (ref <150)

## 2017-09-24 LAB — HEMOGLOBIN A1C
HEMOGLOBIN A1C: 6.6 %{Hb} — AB (ref <5.7)
Mean Plasma Glucose: 143 (calc)
eAG (mmol/L): 7.9 (calc)

## 2017-09-24 LAB — INSULIN, RANDOM: Insulin: 17.6 u[IU]/mL (ref 2.0–19.6)

## 2017-09-24 LAB — BASIC METABOLIC PANEL WITH GFR
BUN: 15 mg/dL (ref 7–25)
CHLORIDE: 102 mmol/L (ref 98–110)
CO2: 24 mmol/L (ref 20–32)
Calcium: 10.6 mg/dL — ABNORMAL HIGH (ref 8.6–10.4)
Creat: 0.87 mg/dL (ref 0.60–0.93)
GFR, EST AFRICAN AMERICAN: 75 mL/min/{1.73_m2} (ref >=60)
GFR, Est Non African American: 65 mL/min/{1.73_m2} (ref >=60)
GLUCOSE: 103 mg/dL — AB (ref 65–99)
Potassium: 4.3 mmol/L (ref 3.5–5.3)
SODIUM: 136 mmol/L (ref 135–146)

## 2017-09-24 LAB — VITAMIN D 25 HYDROXY (VIT D DEFICIENCY, FRACTURES): Vit D, 25-Hydroxy: 39 ng/mL (ref 30–100)

## 2017-09-24 LAB — MAGNESIUM: MAGNESIUM: 2.3 mg/dL (ref 1.5–2.5)

## 2017-09-24 LAB — TSH: TSH: 3.95 m[IU]/L (ref 0.40–4.50)

## 2017-09-30 ENCOUNTER — Other Ambulatory Visit: Payer: Self-pay | Admitting: Internal Medicine

## 2017-10-06 ENCOUNTER — Other Ambulatory Visit: Payer: Self-pay | Admitting: Internal Medicine

## 2017-10-06 MED ORDER — TELMISARTAN 80 MG PO TABS: ORAL_TABLET | ORAL | 1 refills | Status: DC

## 2017-10-31 ENCOUNTER — Ambulatory Visit
Admission: RE | Admit: 2017-10-31 | Discharge: 2017-10-31 | Disposition: A | Discharge status: Home / Self Care | Payer: PPO | Source: Ambulatory Visit | Attending: Oncology | Admitting: Oncology

## 2017-10-31 ENCOUNTER — Other Ambulatory Visit: Payer: Self-pay | Admitting: Oncology

## 2017-10-31 DIAGNOSIS — Z853 Personal history of malignant neoplasm of breast: Secondary | ICD-10-CM

## 2017-10-31 DIAGNOSIS — R922 Inconclusive mammogram: Secondary | ICD-10-CM | POA: Diagnosis not present

## 2017-10-31 DIAGNOSIS — N6489 Other specified disorders of breast: Secondary | ICD-10-CM | POA: Diagnosis not present

## 2017-11-06 ENCOUNTER — Other Ambulatory Visit: Payer: Self-pay | Admitting: Oncology

## 2017-11-06 ENCOUNTER — Ambulatory Visit
Admission: RE | Admit: 2017-11-06 | Discharge: 2017-11-06 | Disposition: A | Discharge status: Home / Self Care | Payer: PPO | Source: Ambulatory Visit | Attending: Oncology | Admitting: Oncology

## 2017-11-06 DIAGNOSIS — Z853 Personal history of malignant neoplasm of breast: Secondary | ICD-10-CM | POA: Diagnosis not present

## 2017-11-06 DIAGNOSIS — R590 Localized enlarged lymph nodes: Secondary | ICD-10-CM | POA: Diagnosis not present

## 2017-12-02 ENCOUNTER — Ambulatory Visit (INDEPENDENT_AMBULATORY_CARE_PROVIDER_SITE_OTHER): Payer: PPO | Admitting: Adult Health

## 2017-12-02 ENCOUNTER — Encounter: Payer: Self-pay | Admitting: Adult Health

## 2017-12-02 VITALS — BP 110/60 | HR 74 | Temp 97.9°F | Ht 63.25 in | Wt 155.0 lb

## 2017-12-02 DIAGNOSIS — M62838 Other muscle spasm: Secondary | ICD-10-CM

## 2017-12-02 MED ORDER — CYCLOBENZAPRINE HCL 5 MG PO TABS: 5.0000 mg | ORAL_TABLET | Freq: Three times a day (TID) | ORAL | 0 refills | Status: DC | PRN

## 2017-12-02 MED ORDER — MELOXICAM 15 MG PO TABS: ORAL_TABLET | ORAL | 1 refills | Status: DC

## 2017-12-02 NOTE — Patient Instructions (Signed)
Mobic once daily - don't take with ibuprofen  Flexeril - muscle relaxer - take at night to begin with until you know how it affects you - may make you feel drowsy  Apply heat to your neck and shoulder   Neck Exercises Neck exercises can be important for many reasons:  They can help you to improve and maintain flexibility in your neck. This can be especially important as you age.  They can help to make your neck stronger. This can make movement easier.  They can reduce or prevent neck pain.  They may help your upper back.  Ask your health care provider which neck exercises would be best for you. Exercises Neck Press Repeat this exercise 10 times. Do it first thing in the morning and right before bed or as told by your health care provider. 1. Lie on your back on a firm bed or on the floor with a pillow under your head. 2. Use your neck muscles to push your head down on the pillow and straighten your spine. 3. Hold the position as well as you can. Keep your head facing up and your chin tucked. 4. Slowly count to 5 while holding this position. 5. Relax for a few seconds. Then repeat.  Isometric Strengthening Do a full set of these exercises 2 times a day or as told by your health care provider. 1. Sit in a supportive chair and place your hand on your forehead. 2. Push forward with your head and neck while pushing back with your hand. Hold for 10 seconds. 3. Relax. Then repeat the exercise 3 times. 4. Next, do thesequence again, this time putting your hand against the back of your head. Use your head and neck to push backward against the hand pressure. 5. Finally, do the same exercise on either side of your head, pushing sideways against the pressure of your hand.  Prone Head Lifts Repeat this exercise 5 times. Do this 2 times a day or as told by your health care provider. 1. Lie face-down, resting on your elbows so that your chest and upper back are raised. 2. Start with your head  facing downward, near your chest. Position your chin either on or near your chest. 3. Slowly lift your head upward. Lift until you are looking straight ahead. Then continue lifting your head as far back as you can stretch. 4. Hold your head up for 5 seconds. Then slowly lower it to your starting position.  Supine Head Lifts Repeat this exercise 8-10 times. Do this 2 times a day or as told by your health care provider. 1. Lie on your back, bending your knees to point to the ceiling and keeping your feet flat on the floor. 2. Lift your head slowly off the floor, raising your chin toward your chest. 3. Hold for 5 seconds. 4. Relax and repeat.  Scapular Retraction Repeat this exercise 5 times. Do this 2 times a day or as told by your health care provider. 1. Stand with your arms at your sides. Look straight ahead. 2. Slowly pull both shoulders backward and downward until you feel a stretch between your shoulder blades in your upper back. 3. Hold for 10-30 seconds. 4. Relax and repeat.  Contact a health care provider if:  Your neck pain or discomfort gets much worse when you do an exercise.  Your neck pain or discomfort does not improve within 2 hours after you exercise. If you have any of these problems, stop exercising right  away. Do not do the exercises again unless your health care provider says that you can. Get help right away if:  You develop sudden, severe neck pain. If this happens, stop exercising right away. Do not do the exercises again unless your health care provider says that you can. Exercises Neck Stretch  Repeat this exercise 3-5 times. 1. Do this exercise while standing or while sitting in a chair. 2. Place your feet flat on the floor, shoulder-width apart. 3. Slowly turn your head to the right. Turn it all the way to the right so you can look over your right shoulder. Do not tilt or tip your head. 4. Hold this position for 10-30 seconds. 5. Slowly turn your head to  the left, to look over your left shoulder. 6. Hold this position for 10-30 seconds.  Neck Retraction Repeat this exercise 8-10 times. Do this 3-4 times a day or as told by your health care provider. 1. Do this exercise while standing or while sitting in a sturdy chair. 2. Look straight ahead. Do not bend your neck. 3. Use your fingers to push your chin backward. Do not bend your neck for this movement. Continue to face straight ahead. If you are doing the exercise properly, you will feel a slight sensation in your throat and a stretch at the back of your neck. 4. Hold the stretch for 1-2 seconds. Relax and repeat.  This information is not intended to replace advice given to you by your health care provider. Make sure you discuss any questions you have with your health care provider. Document Released: 06/19/2015 Document Revised: 12/14/2015 Document Reviewed: 01/16/2015 Elsevier Interactive Patient Education  [employer].

## 2017-12-02 NOTE — Progress Notes (Signed)
Assessment and Plan:  [name] was seen today for ear pain, neck pain and shoulder pain.  Diagnoses and all orders for this visit:  Neck muscle spasm Mobic daily x 2 weeks then as needed Flexeril 5 mg TID PRN - SE discussed, try at night first, don't take with ETOH or sedating meds Apply heat every few hours Start gentle neck stretches and exercises in a few days If not improving in 2-3 weeks give [place] a call Can refer to PT for dry needling or ortho if not better Call if having any dizziness, numbness, tingling, headaches.   Further disposition pending results of labs. Discussed med's effects and SE's.   Over 15 minutes of exam, counseling, chart review, and critical decision making was performed.   Future Appointments  Date Time Provider [place]  12/04/2017  1:30 PM [facility] 4 [facility] None  12/04/2017  2:00 PM Magrinat, Virgie Dad, MD [facility] None  01/12/2018  2:30 PM Liane Comber, NP [facility] None  04/16/2018  2:30 PM [name], MD [facility] None  10/15/2018  2:00 PM [name], MD [facility] None    ------------------------------------------------------------------------------------------------------------------   HPI BP 110/60   Pulse 74   Temp 97.9 F (36.6 C)   Ht 5' 3.25" (1.607 m)   Wt 155 lb (70.3 kg)   SpO2 91%   BMI 27.24 kg/m   77 y.o.female presents for right sided neck/shoulder pain x 1 week; no injury. Reports she woke up with stiff neck. Reports pain is sharp, and throbbing, intermittent. Non-radiating, denies numbness/tingling of arms/hands. Pain ranges 2/10- 8/10. She reports any movement makes it worse. She also endorses throbbing in her R ear. She has tried ibuprofen 200 mg 2-3 times a day without much improvement.   She denies sore throat, popping/ringing in ear, cough, headaches.   Past Medical History:  Diagnosis Date  . Allergic rhinitis   . ASCVD (arteriosclerotic cardiovascular disease)   . Atherosclerosis of  aorta ([place]) 2014   via CT AB/Pelvis- no AAA  . COPD (chronic obstructive pulmonary disease) ([place])   . DJD (degenerative joint disease)    neck, lower back, feet, hands  . Elevated hemoglobin A1c measurement   . Tobacco abuse      Allergies  Allergen Reactions  . Bisoprolol Other (See Comments)    Decreased Heart Rate  . Ace Inhibitors Cough  . Acyclovir And Related Rash and Other (See Comments)    Okay to take famvir  . Amoxil [Amoxicillin] Diarrhea  . Ciprofloxacin Diarrhea  . Crestor [Rosuvastatin] Other (See Comments)    Elevated LFT's and Myalgias  . Doxycycline Diarrhea  . Enalapril Cough  . Lipitor [Atorvastatin] Other (See Comments)    Elevated LFT's and Myalgias  . Metformin And Related Diarrhea  . Prednisone Other (See Comments)    Intolerant to High Dose Prednisone  . Vicodin [Hydrocodone-Acetaminophen] Other (See Comments)    Increased Anxiety and Nervousness    Current Outpatient Medications on File Prior to Visit  Medication Sig  . albuterol (PROVENTIL HFA;VENTOLIN HFA) 108 (90 BASE) MCG/ACT inhaler Inhale 2 puffs into the lungs every 6 (six) hours as needed for wheezing or shortness of breath.  Marland Kitchen alendronate (FOSAMAX) 70 MG tablet Take 1 tablet (70 mg total) by mouth once a week. Take with a full glass of water on an empty stomach.  Marland Kitchen aspirin 81 MG tablet Take 81 mg by mouth daily.    . Cholecalciferol (VITAMIN D3) 2000 UNITS TABS Take 4,000 Units by mouth  daily.   . diclofenac sodium (VOLTAREN) 1 % GEL Apply 4 g topically 4 (four) times daily.  . fenofibrate micronized (LOFIBRA) 134 MG capsule Take 1 capsule (134 mg total) by mouth daily.  . fish oil-omega-3 fatty acids 1000 MG capsule Take 1 capsule by mouth 3 (three) times daily.   . Flaxseed, Linseed, (FLAX SEED OIL) 1000 MG CAPS Take 1,000 mg by mouth 3 (three) times daily.   . fluticasone (FLONASE) 50 MCG/ACT nasal spray Place 2 sprays into both nostrils daily.  . Magnesium 500 MG TABS Take 500 mg by  mouth 2 (two) times daily.   . metFORMIN (GLUCOPHAGE-XR) 500 MG 24 hr tablet TAKE FOUR TABLETS BY MOUTH ONCE DAILY  . telmisartan (MICARDIS) 80 MG tablet Take 1 tablet daily for BP  . letrozole (FEMARA) 2.5 MG tablet Take 1 tablet (2.5 mg total) by mouth daily. (Patient not taking: Reported on 12/02/2017)   No current facility-administered medications on file prior to visit.     ROS: all negative except above.   Physical Exam:  BP 110/60   Pulse 74   Temp 97.9 F (36.6 C)   Ht 5' 3.25" (1.607 m)   Wt 155 lb (70.3 kg)   SpO2 91%   BMI 27.24 kg/m   General Appearance: Well nourished, in no apparent distress. Eyes: PERRLA, EOMs, conjunctiva no swelling or erythema Sinuses: No Frontal/maxillary tenderness ENT/Mouth: Ext aud canals clear, TMs without erythema, bulging. No erythema, swelling, or exudate on post pharynx.  Tonsils not swollen or erythematous. Hearing normal.  Neck: Supple, thyroid normal.  Respiratory: Respiratory effort normal, BS equal bilaterally without rales, rhonchi, wheezing or stridor.  Cardio: RRR with no MRGs. Brisk peripheral pulses without edema.  Abdomen: Soft, + BS.  Non tender, no guarding, rebound, hernias, masses. Lymphatics: Non tender without lymphadenopathy.  Musculoskeletal: R shoulder ROM limited consistent with baseline, pain with rotation of neck to R trap and sternocleidomastoid, tenderness over musculature without palpable bony abnormality, slow gait with walker Skin: Warm, dry without rashes, lesions, ecchymosis.  Neuro: Cranial nerves intact. Normal muscle tone, no cerebellar symptoms. Sensation intact.  Psych: Awake and oriented X 3, normal affect, Insight and Judgment appropriate.    Izora Ribas, NP 2:51 PM [facility]

## 2017-12-03 ENCOUNTER — Other Ambulatory Visit: Payer: Self-pay | Admitting: *Deleted

## 2017-12-03 ENCOUNTER — Telehealth: Payer: Self-pay | Admitting: Oncology

## 2017-12-03 ENCOUNTER — Other Ambulatory Visit: Payer: Self-pay

## 2017-12-03 DIAGNOSIS — C50312 Malignant neoplasm of lower-inner quadrant of left female breast: Secondary | ICD-10-CM

## 2017-12-03 DIAGNOSIS — Z170 Estrogen receptor positive status [ER+]: Principal | ICD-10-CM

## 2017-12-03 MED ORDER — BACLOFEN 5 MG PO TABS: 5.0000 mg | ORAL_TABLET | Freq: Three times a day (TID) | ORAL | 0 refills | Status: AC

## 2017-12-03 NOTE — Telephone Encounter (Signed)
Received call 5/14 stating they would like to cancel 5/16 appt

## 2017-12-03 NOTE — Telephone Encounter (Signed)
Insurance will not cover Cyclobenzaprine, needs new Rx.

## 2017-12-04 ENCOUNTER — Inpatient Hospital Stay: Payer: PPO | Admitting: Oncology

## 2017-12-04 ENCOUNTER — Inpatient Hospital Stay: Payer: PPO

## 2018-01-11 DIAGNOSIS — M810 Age-related osteoporosis without current pathological fracture: Secondary | ICD-10-CM | POA: Insufficient documentation

## 2018-01-11 NOTE — Progress Notes (Signed)
MEDICARE ANNUAL WELLNESS VISIT AND FOLLOW UP  Assessment:    Annual medicare wellness exam, encounter for   Essential hypertension -well controlled today -cont meds -diet and exercise -recommended quitting smoking - TSH  ASCVD (arteriosclerotic cardiovascular disease) -followed by cards -cont daily asa -recommended quitting smoking  Aortic atherosclerosis ([place]) -cont asa -cont cholesterol meds -quit smoking  Seasonal and perennial allergic rhinitis -followed by pulmonology  COPD mixed type ([place]) -followed by pulmonology  Diverticulosis of colon without hemorrhage -follow with scheduled colonoscopy -cipro and flagyl given for possible flare   Other cirrhosis of liver ([place]) -monitor LFTs -avoid tylenol and alcohol  Type 2 diabetes mellitus with stage 3 chronic kidney disease, without long-term current use of insulin ([facility]) -cont diet and exercise -cont meds - Hemoglobin A1c  Primary osteoarthritis involving multiple joints -exercise -avoid tylenol for pain management due to liver   Hyperlipidemia -cont meds - Lipid panel  TOBACCO USER -not ready to quit smoking - CXR and CPE  Medication management - CBC with Differential/Platelet - BASIC METABOLIC PANEL WITH GFR - Hepatic function panel  Vitamin D deficiency -cont supplement  Cancer of lower-inner quadrant of left female breast ([place]) -followed by oncology  Blindness, 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 Sabra Heck  Osteoporosis Cont vitamin D, calcium On fosamax Repeat DEXA 02/2018   Over 30 minutes of exam, counseling, chart review, and critical decision making was performed  Plan:   During the course of the visit the patient was educated and counseled about appropriate screening and preventive services including:    Pneumococcal vaccine   Influenza vaccine  Td vaccine  Prevnar 13  Screening electrocardiogram  Screening mammography  Bone densitometry screening  Colorectal cancer  screening  Diabetes screening  Glaucoma screening  Nutrition counseling   Advanced directives: given info/requested copies   Subjective:   [name] is a 77 y.o. female who presents for Medicare Annual Wellness Visit and 3 month follow up on hypertension, prediabetes, hyperlipidemia, vitamin D def.   Patient can not read due to blindness, no longer seeing opth as nothing can be done  BMI is Body mass index is 26.89 kg/m., she has been working on diet, exercise limited due to blindness, walks in her yard/house ~15 min daily Wt Readings from Last 3 Encounters:  01/12/18 153 lb (69.4 kg)  12/02/17 155 lb (70.3 kg)  09/23/17 154 lb 12.8 oz (70.2 kg)   Her blood pressure has been controlled at home, today their BP is BP: 110/60 She does not workout. She denies chest pain, shortness of breath, dizziness.   She is on cholesterol medication and denies myalgias. Her cholesterol is at goal. The cholesterol last visit was:   Lab Results  Component Value Date   CHOL 170 09/23/2017   HDL 41 (L) 09/23/2017   LDLCALC 94 09/23/2017   TRIG 265 (H) 09/23/2017   CHOLHDL 4.1 09/23/2017   She has been working on diet for T2 diabetes on metformin, and denies foot ulcerations, hyperglycemia, hypoglycemia , increased appetite, nausea, paresthesia of the feet, polydipsia, polyuria, vomiting and weight loss. Last A1C in the office was:  Lab Results  Component Value Date   HGBA1C 6.6 (H) 09/23/2017   Last GFR Lab Results  Component Value Date   GFRNONAA 65 09/23/2017   Patient is on Vitamin D supplement but takes inconsistently Lab Results  Component Value Date   VD25OH 39 09/23/2017       Medication Review Current Outpatient Medications on File Prior  to Visit  Medication Sig Dispense Refill  . albuterol (PROVENTIL HFA;VENTOLIN HFA) 108 (90 BASE) MCG/ACT inhaler Inhale 2 puffs into the lungs every 6 (six) hours as needed for wheezing or shortness of breath.    Marland Kitchen alendronate  (FOSAMAX) 70 MG tablet Take 1 tablet (70 mg total) by mouth once a week. Take with a full glass of water on an empty stomach. 12 tablet 1  . aspirin 81 MG tablet Take 81 mg by mouth daily.      . baclofen 5 MG TABS Take 5 mg by mouth 3 (three) times daily. 180 tablet 0  . Cholecalciferol (VITAMIN D3) 2000 UNITS TABS Take 4,000 Units by mouth daily.     . diclofenac sodium (VOLTAREN) 1 % GEL Apply 4 g topically 4 (four) times daily. 100 g 3  . fenofibrate micronized (LOFIBRA) 134 MG capsule Take 1 capsule (134 mg total) by mouth daily. 90 capsule 1  . fish oil-omega-3 fatty acids 1000 MG capsule Take 1 capsule by mouth 3 (three) times daily.     . Flaxseed, Linseed, (FLAX SEED OIL) 1000 MG CAPS Take 1,000 mg by mouth 3 (three) times daily.     . fluticasone (FLONASE) 50 MCG/ACT nasal spray Place 2 sprays into both nostrils daily. 16 g 1  . Magnesium 500 MG TABS Take 500 mg by mouth 2 (two) times daily.     . meloxicam (MOBIC) 15 MG tablet Take one daily with food for 2 weeks, can take with tylenol, can not take with aleve, iburpofen, then as needed daily for pain 30 tablet 1  . metFORMIN (GLUCOPHAGE-XR) 500 MG 24 hr tablet TAKE FOUR TABLETS BY MOUTH ONCE DAILY 360 tablet 1  . telmisartan (MICARDIS) 80 MG tablet Take 1 tablet daily for BP 90 tablet 1  . letrozole (FEMARA) 2.5 MG tablet Take 1 tablet (2.5 mg total) by mouth daily. (Patient not taking: Reported on 12/02/2017) 90 tablet 3   No current facility-administered medications on file prior to visit.     Current Problems (verified) Patient Active Problem List   Diagnosis Date Noted  . Osteoporosis 01/11/2018  . Aortic atherosclerosis ([place]) 10/31/2015  . 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 Malignant neoplasm of lower-inner quadrant of left breast in female, estrogen receptor positive ([place]) 10/26/2014  . Type 2 diabetes mellitus ([place]) 06/07/2014  . Medication management 11/23/2013  . Vitamin D deficiency 11/23/2013  . Hepatic  cirrhosis ([place]) 10/01/2013  . Diverticulosis of colon without hemorrhage 08/24/2013  . DJD (degenerative joint disease)   . ASCVD (arteriosclerotic cardiovascular disease)   . Seasonal and perennial allergic rhinitis 01/18/2010  . Mixed hyperlipidemia 01/12/2010  . TOBACCO USER 01/12/2010  . Essential hypertension 01/12/2010    Screening Tests Immunization History  Administered Date(s) Administered  . Hepatitis B, ped/adol 12/17/2013, 01/17/2014, 07/19/2014  . Influenza Split 05/17/2011, 04/21/2012  . Influenza Whole 04/23/2009, 04/13/2010  . Influenza, High Dose Seasonal PF 06/07/2014, 04/05/2015, 05/07/2016, 04/29/2017  . Influenza,inj,Quad PF,6+ Mos 04/06/2013  . PPD Test 06/07/2014  . Pneumococcal Conjugate-13 06/07/2014  . Pneumococcal Polysaccharide-23 05/17/2011  . Td 07/23/2007    Preventative care: Last colonoscopy: 2015 Last mammogram: 10/2017 - concerning lymph nodes neg on biopsy Last pap smear/pelvic exam: 10/25/15  DEXA: 02/2016 osteoporosis of forearm only   Prior vaccinations: TD or Tdap: 2009 DUE, declines Influenza: 2018 Pneumococcal: 2012 Prevnar13: 2015 Shingles/Zostavax: Declined  Names of Other Physician/Practitioners you currently use: 1. [facility]- here for primary  care 2. Dr. Sabra Heck, eye doctor, last visit 2017, stopping because there is nothing that can be done 3. Dentures, dentist, last visit not seeing one  Patient Care Team: [name], MD as PCP - General (Internal Medicine) Inda Castle, MD (Inactive) as Consulting Physician (Gastroenterology) Deneise Lever, MD as Consulting Physician (Pulmonary Disease) Marica Otter, [place] (Optometry) [name], MD as Consulting Physician (General Surgery) Magrinat, Virgie Dad, MD as Consulting Physician (Oncology) Eppie Gibson, MD as Attending Physician (Radiation Oncology) Mauro Kaufmann, RN as Registered Nurse Rockwell Germany, RN as Registered  Nurse Jake Shark, Johny Blamer, NP as Nurse Practitioner (Nurse Practitioner) Ninetta Lights, MD as Consulting Physician (Orthopedic Surgery)  Allergies Allergies  Allergen Reactions  . Bisoprolol Other (See Comments)    Decreased Heart Rate  . Ace Inhibitors Cough  . Acyclovir And Related Rash and Other (See Comments)    Okay to take famvir  . Amoxil [Amoxicillin] Diarrhea  . Ciprofloxacin Diarrhea  . Crestor [Rosuvastatin] Other (See Comments)    Elevated LFT's and Myalgias  . Doxycycline Diarrhea  . Enalapril Cough  . Lipitor [Atorvastatin] Other (See Comments)    Elevated LFT's and Myalgias  . Metformin And Related Diarrhea  . Prednisone Other (See Comments)    Intolerant to High Dose Prednisone  . 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 FAMILY HISTORY Her family history includes Allergies in her brother; Asthma in her sister and sister; Breast cancer in her sister and sister; Cancer in her mother and sister; Emphysema in her brother, brother, and sister; Heart disease in her brother, mother, and sister. SOCIAL HISTORY She  reports that she has been smoking cigarettes and e-cigarettes.  She has a 41.25 pack-year smoking history. She has never used smokeless tobacco. She reports that she does not drink alcohol or use drugs.  MEDICARE WELLNESS OBJECTIVES: Physical activity: Current Exercise Habits: Home  exercise routine, Type of exercise: walking(in yard), Time (Minutes): 15, Frequency (Times/Week): 7, Weekly Exercise (Minutes/Week): 105, Intensity: Mild, Exercise limited by: Other - see comments(legally blind) Cardiac risk factors: Cardiac Risk Factors include: advanced age (>31men, >84 women);hypertension;dyslipidemia;diabetes mellitus;sedentary lifestyle;smoking/ tobacco exposure Depression/mood screen:   Depression screen [facility] 2/9 01/12/2018  Decreased Interest 0  Down, Depressed, Hopeless 0  PHQ - 2 Score 0    ADLs:  In your present state of health, do you have any difficulty performing the following activities: 01/12/2018 09/23/2017  Hearing? N N  Vision? Y N  Comment Legally blind, has some peripheral vision remaining -  Difficulty concentrating or making decisions? N N  Walking or climbing stairs? Y N  Comment Avoids due to vision -  Dressing or bathing? N N  Doing errands, shopping? Y N  Comment Driven by husband -  Conservation officer, nature and eating ? N -  Comment Husband helps if needed -  Using the Toilet? N -  In the past six months, have you accidently leaked urine? N -  Do you have problems with loss of bowel control? N -  Managing your Medications? N -  Managing your  Finances? N -  Comment Managed by husband -  Housekeeping or managing your Housekeeping? N -  Some recent data might be hidden     Cognitive Testing  Alert? Yes  Normal Appearance?Yes  Oriented to person? Yes  Place? Yes   Time? Yes  Recall of three objects?  Yes  Can perform simple calculations? Yes  Displays appropriate judgment?Yes  Can read the correct time from a watch face? - n/a due to vision  EOL planning: Does Patient Have a Medical Advance Directive?: No Would patient like information on creating a medical advance directive?: No - Patient declined(Has papers at home)   Objective:   Today's Vitals   01/12/18 1414  BP: 110/60  Pulse: 92  Temp: (!) 97.4 F (36.3 C)  SpO2: 97%  Weight: 153 lb  (69.4 kg)  Height: 5' 3.25" (1.607 m)   Body mass index is 26.89 kg/m.  General appearance: alert, no distress, WD/WN,  female HEENT: normocephalic, sclerae anicteric, TMs pearly, nares patent, no discharge or erythema, pharynx normal Oral cavity: MMM, no lesions Neck: supple, no lymphadenopathy, no thyromegaly, no masses Heart: RRR, normal S1, S2, no murmurs Lungs: CTA bilaterally, no wheezes, rhonchi, or rales Abdomen: +bs, soft, non tender, non distended, no masses, no hepatomegaly, no splenomegaly Musculoskeletal: Full ROM, nontender, no swelling, no obvious deformity Extremities: mild edema left leg compared to right, no cyanosis, no clubbing Pulses: 2+ symmetric, upper and lower extremities, normal cap refill Neurological: alert, oriented x 3, CN2-12 intact, strength normal upper extremities and lower extremities, sensation normal throughout, DTRs 2+ throughout, no cerebellar signs, gait slow, antalgic Psychiatric: normal affect, behavior normal, pleasant  Breast: defer Gyn: defer Rectal: defer   Medicare Attestation I have personally reviewed: The patient's medical and social history Their use of alcohol, tobacco or illicit drugs Their current medications and supplements The patient's functional ability including ADLs,fall risks, home safety risks, cognitive, and hearing and visual impairment Diet and physical activities Evidence for depression or mood disorders  The patient's weight, height, BMI, and visual acuity have been recorded in the chart.  I have made referrals, counseling, and provided education to the patient based on review of the above and I have provided the patient with a written personalized care plan for preventive services.     Izora Ribas, NP   01/12/2018

## 2018-01-12 ENCOUNTER — Encounter: Payer: Self-pay | Admitting: Adult Health

## 2018-01-12 ENCOUNTER — Ambulatory Visit (INDEPENDENT_AMBULATORY_CARE_PROVIDER_SITE_OTHER): Payer: PPO | Admitting: Adult Health

## 2018-01-12 VITALS — BP 110/60 | HR 92 | Temp 97.4°F | Ht 63.25 in | Wt 153.0 lb

## 2018-01-12 DIAGNOSIS — J302 Other seasonal allergic rhinitis: Secondary | ICD-10-CM

## 2018-01-12 DIAGNOSIS — Z79899 Other long term (current) drug therapy: Secondary | ICD-10-CM

## 2018-01-12 DIAGNOSIS — I2510 Atherosclerotic heart disease of native coronary artery without angina pectoris: Secondary | ICD-10-CM | POA: Diagnosis not present

## 2018-01-12 DIAGNOSIS — E559 Vitamin D deficiency, unspecified: Secondary | ICD-10-CM

## 2018-01-12 DIAGNOSIS — I10 Essential (primary) hypertension: Secondary | ICD-10-CM

## 2018-01-12 DIAGNOSIS — N182 Chronic kidney disease, stage 2 (mild): Secondary | ICD-10-CM | POA: Diagnosis not present

## 2018-01-12 DIAGNOSIS — E1122 Type 2 diabetes mellitus with diabetic chronic kidney disease: Secondary | ICD-10-CM | POA: Diagnosis not present

## 2018-01-12 DIAGNOSIS — K7469 Other cirrhosis of liver: Secondary | ICD-10-CM | POA: Diagnosis not present

## 2018-01-12 DIAGNOSIS — K5730 Diverticulosis of large intestine without perforation or abscess without bleeding: Secondary | ICD-10-CM

## 2018-01-12 DIAGNOSIS — Z0000 Encounter for general adult medical examination without abnormal findings: Secondary | ICD-10-CM

## 2018-01-12 DIAGNOSIS — C50312 Malignant neoplasm of lower-inner quadrant of left female breast: Secondary | ICD-10-CM | POA: Diagnosis not present

## 2018-01-12 DIAGNOSIS — M810 Age-related osteoporosis without current pathological fracture: Secondary | ICD-10-CM

## 2018-01-12 DIAGNOSIS — E782 Mixed hyperlipidemia: Secondary | ICD-10-CM | POA: Diagnosis not present

## 2018-01-12 DIAGNOSIS — F17200 Nicotine dependence, unspecified, uncomplicated: Secondary | ICD-10-CM

## 2018-01-12 DIAGNOSIS — Z170 Estrogen receptor positive status [ER+]: Secondary | ICD-10-CM

## 2018-01-12 DIAGNOSIS — J3089 Other allergic rhinitis: Secondary | ICD-10-CM | POA: Diagnosis not present

## 2018-01-12 DIAGNOSIS — I700 Atherosclerosis of aorta: Secondary | ICD-10-CM

## 2018-01-12 DIAGNOSIS — H548 Legal blindness, as defined in USA: Secondary | ICD-10-CM

## 2018-01-12 NOTE — Patient Instructions (Signed)
Aim for 7+ servings of fruits and vegetables daily  80+ fluid ounces of water or unsweet tea for healthy kidneys  Limit animal fats in diet for cholesterol and heart health - choose grass fed whenever available  Aim for low stress - take time to unwind and care for your mental health  Aim for 150 min of moderate intensity exercise weekly for heart health, and weights twice weekly for bone health  Aim for 7-9 hours of sleep daily     Smoking Tobacco Information Smoking tobacco will very likely harm your health. Tobacco contains a poisonous (toxic), colorless chemical called nicotine. Nicotine affects the brain and makes tobacco addictive. This change in your brain can make it hard to stop smoking. Tobacco also has other toxic chemicals that can hurt your body and raise your risk of many cancers. How can smoking tobacco affect me? Smoking tobacco can increase your chances of having serious health conditions, such as:  Cancer. Smoking is most commonly associated with lung cancer, but can lead to cancer in other parts of the body.  Chronic obstructive pulmonary disease (COPD). This is a long-term lung condition that makes it hard to breathe. It also gets worse over time.  High blood pressure (hypertension), heart disease, stroke, or heart attack.  Lung infections, such as pneumonia.  Cataracts. This is when the lenses in the eyes become clouded.  Digestive problems. This may include peptic ulcers, heartburn, and gastroesophageal reflux disease (GERD).  Oral health problems, such as gum disease and tooth loss.  Loss of taste and smell.  Smoking can affect your appearance by causing:  Wrinkles.  Yellow or stained teeth, fingers, and fingernails.  Smoking tobacco can also affect your social life.  Many workplaces, [employer], hotels, and public places are tobacco-free. This means that you may experience challenges in finding places to smoke when away from home.  The cost of a  smoking habit can be expensive. Expenses for someone who smokes come in two ways: ? You spend money on a regular basis to buy tobacco. ? Your health care costs in the long-term are higher if you smoke.  Tobacco smoke can also affect the health of those around you. Children of smokers have greater chances of: ? Sudden infant death syndrome (SIDS). ? Ear infections. ? Lung infections.  What lifestyle changes can be made?  Do not start smoking. Quit if you already do.  To quit smoking: ? Make a plan to quit smoking and commit yourself to it. Look for programs to help you and ask your health care provider for recommendations and ideas. ? Talk with your health care provider about using nicotine replacement medicines to help you quit. Medicine replacement medicines include gum, lozenges, patches, sprays, or pills. ? Do not replace cigarette smoking with electronic cigarettes, which are commonly called e-cigarettes. The safety of e-cigarettes is not known, and some may contain harmful chemicals. ? Avoid places, people, or situations that tempt you to smoke. ? If you try to quit but return to smoking, don't give up hope. It is very common for people to try a number of times before they fully succeed. When you feel ready again, give it another try.  Quitting smoking might affect the way you eat as well as your weight. Be prepared to monitor your eating habits. Get support in planning and following a healthy diet.  Ask your health care provider about having regular tests (screenings) to check for cancer. This may include blood tests, imaging tests,  and other tests.  Exercise regularly. Consider taking walks, joining a gym, or doing yoga or exercise classes.  Develop skills to manage your stress. These skills include meditation. What are the benefits of quitting smoking? By quitting smoking, you may:  Lower your risk of getting cancer and other diseases caused by smoking.  Live  longer.  Breathe better.  Lower your blood pressure and heart rate.  Stop your addiction to tobacco.  Stop creating secondhand smoke that hurts other people.  Improve your sense of taste and smell.  Look better over time, due to having fewer wrinkles and less staining.  What can happen if changes are not made? If you do not stop smoking, you may:  Get cancer and other diseases.  Develop COPD or other long-term (chronic) lung conditions.  Develop serious problems with your heart and blood vessels (cardiovascular system).  Need more tests to screen for problems caused by smoking.  Have higher, long-term healthcare costs from medicines or treatments related to smoking.  Continue to have worsening changes in your lungs, mouth, and nose.  Where to find support: To get support to quit smoking, consider:  Asking your health care provider for more information and resources.  Taking classes to learn more about quitting smoking.  Looking for local organizations that offer resources about quitting smoking.  Joining a support group for people who want to quit smoking in your local community.  Where to find more information: You may find more information about quitting smoking from:  HelpGuide.org: www.helpguide.org/articles/addictions/how-to-quit-smoking.htm  https://hall.com/: smokefree.gov  American Lung Association: www.lung.org  Contact a health care provider if:  You have problems breathing.  Your lips, nose, or fingers turn blue.  You have chest pain.  You are coughing up blood.  You feel faint or you pass out.  You have other noticeable changes that cause you to worry. Summary  Smoking tobacco can negatively affect your health, the health of those around you, your finances, and your social life.  Do not start smoking. Quit if you already do. If you need help quitting, ask your health care provider.  Think about joining a support group for people who want to  quit smoking in your local community. There are many effective programs that will help you to quit this behavior. This information is not intended to replace advice given to you by your health care provider. Make sure you discuss any questions you have with your health care provider. Document Released: 07/23/2016 Document Revised: 07/23/2016 Document Reviewed: 07/23/2016 Elsevier Interactive Patient Education  [employer].

## 2018-01-13 LAB — COMPLETE METABOLIC PANEL WITH GFR
AG RATIO: 1.4 (calc) (ref 1.0–2.5)
ALT: 24 U/L (ref 6–29)
AST: 22 U/L (ref 10–35)
Albumin: 4.3 g/dL (ref 3.6–5.1)
Alkaline phosphatase (APISO): 92 U/L (ref 33–130)
BUN/Creatinine Ratio: 14 (calc) (ref 6–22)
BUN: 14 mg/dL (ref 7–25)
CALCIUM: 10.4 mg/dL (ref 8.6–10.4)
CO2: 26 mmol/L (ref 20–32)
CREATININE: 1 mg/dL — AB (ref 0.60–0.93)
Chloride: 102 mmol/L (ref 98–110)
GFR, EST AFRICAN AMERICAN: 63 mL/min/{1.73_m2} (ref >=60)
GFR, EST NON AFRICAN AMERICAN: 55 mL/min/{1.73_m2} — AB (ref >=60)
Globulin: 3.1 g/dL (calc) (ref 1.9–3.7)
Glucose, Bld: 129 mg/dL — ABNORMAL HIGH (ref 65–99)
POTASSIUM: 4.7 mmol/L (ref 3.5–5.3)
Sodium: 135 mmol/L (ref 135–146)
TOTAL PROTEIN: 7.4 g/dL (ref 6.1–8.1)
Total Bilirubin: 0.5 mg/dL (ref 0.2–1.2)

## 2018-01-13 LAB — CBC WITH DIFFERENTIAL/PLATELET
Basophils Absolute: 22 cells/uL (ref 0–200)
Basophils Relative: 0.3 %
EOS PCT: 2.7 %
Eosinophils Absolute: 194 cells/uL (ref 15–500)
HEMATOCRIT: 38.5 % (ref 35.0–45.0)
HEMOGLOBIN: 13.5 g/dL (ref 11.7–15.5)
LYMPHS ABS: 3622 {cells}/uL (ref 850–3900)
MCH: 31.8 pg (ref 27.0–33.0)
MCHC: 35.1 g/dL (ref 32.0–36.0)
MCV: 90.8 fL (ref 80.0–100.0)
MPV: 9.8 fL (ref 7.5–12.5)
Monocytes Relative: 8.7 %
NEUTROS PCT: 38 %
Neutro Abs: 2736 cells/uL (ref 1500–7800)
Platelets: 260 10*3/uL (ref 140–400)
RBC: 4.24 10*6/uL (ref 3.80–5.10)
RDW: 12.6 % (ref 11.0–15.0)
TOTAL LYMPHOCYTE: 50.3 %
WBC mixed population: 626 cells/uL (ref 200–950)
WBC: 7.2 10*3/uL (ref 3.8–10.8)

## 2018-01-13 LAB — LIPID PANEL
CHOL/HDL RATIO: 4.2 (calc) (ref <5.0)
Cholesterol: 182 mg/dL (ref <200)
HDL: 43 mg/dL — AB (ref >50)
LDL CHOLESTEROL (CALC): 100 mg/dL — AB
NON-HDL CHOLESTEROL (CALC): 139 mg/dL — AB (ref <130)
TRIGLYCERIDES: 293 mg/dL — AB (ref <150)

## 2018-01-13 LAB — HEMOGLOBIN A1C
Hgb A1c MFr Bld: 6.8 % of total Hgb — ABNORMAL HIGH (ref <5.7)
MEAN PLASMA GLUCOSE: 148 (calc)
eAG (mmol/L): 8.2 (calc)

## 2018-01-13 LAB — MAGNESIUM: Magnesium: 2.2 mg/dL (ref 1.5–2.5)

## 2018-01-13 LAB — TSH: TSH: 2.79 mIU/L (ref 0.40–4.50)

## 2018-03-28 ENCOUNTER — Other Ambulatory Visit: Payer: Self-pay | Admitting: Internal Medicine

## 2018-04-06 ENCOUNTER — Other Ambulatory Visit: Payer: Self-pay | Admitting: Adult Health

## 2018-04-06 ENCOUNTER — Ambulatory Visit
Admission: RE | Admit: 2018-04-06 | Discharge: 2018-04-06 | Disposition: A | Discharge status: Home / Self Care | Payer: PPO | Source: Ambulatory Visit | Attending: Adult Health | Admitting: Adult Health

## 2018-04-06 DIAGNOSIS — M810 Age-related osteoporosis without current pathological fracture: Secondary | ICD-10-CM

## 2018-04-06 DIAGNOSIS — Z780 Asymptomatic menopausal state: Secondary | ICD-10-CM | POA: Diagnosis not present

## 2018-04-07 ENCOUNTER — Other Ambulatory Visit: Payer: Self-pay | Admitting: Adult Health

## 2018-04-07 ENCOUNTER — Other Ambulatory Visit: Payer: Self-pay

## 2018-04-07 MED ORDER — ALENDRONATE SODIUM 70 MG PO TABS: 70.0000 mg | ORAL_TABLET | ORAL | 3 refills | Status: AC

## 2018-04-16 ENCOUNTER — Ambulatory Visit (INDEPENDENT_AMBULATORY_CARE_PROVIDER_SITE_OTHER): Payer: PPO | Admitting: Internal Medicine

## 2018-04-16 VITALS — BP 112/66 | HR 92 | Temp 97.5°F | Resp 18 | Ht 63.25 in | Wt 151.1 lb

## 2018-04-16 DIAGNOSIS — Z79899 Other long term (current) drug therapy: Secondary | ICD-10-CM | POA: Diagnosis not present

## 2018-04-16 DIAGNOSIS — E559 Vitamin D deficiency, unspecified: Secondary | ICD-10-CM

## 2018-04-16 DIAGNOSIS — E1122 Type 2 diabetes mellitus with diabetic chronic kidney disease: Secondary | ICD-10-CM

## 2018-04-16 DIAGNOSIS — Z23 Encounter for immunization: Secondary | ICD-10-CM | POA: Diagnosis not present

## 2018-04-16 DIAGNOSIS — E782 Mixed hyperlipidemia: Secondary | ICD-10-CM

## 2018-04-16 DIAGNOSIS — I10 Essential (primary) hypertension: Secondary | ICD-10-CM | POA: Diagnosis not present

## 2018-04-16 DIAGNOSIS — I6789 Other cerebrovascular disease: Secondary | ICD-10-CM | POA: Diagnosis not present

## 2018-04-16 DIAGNOSIS — N182 Chronic kidney disease, stage 2 (mild): Secondary | ICD-10-CM

## 2018-04-16 NOTE — Patient Instructions (Signed)

## 2018-04-16 NOTE — Progress Notes (Signed)
This very nice 77 y.o. MWF  presents for 6 month follow up with HTN, HLD, T2_NIDDM and Vitamin D Deficiency. In 1990, she underwent a Rt Mastectomy followed in 2016 with a Lt Lumpectomy.  She followed by Dr [name]. 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     Patient is treated for HTN  (1991) & BP has been controlled at home. Today's BP is at goal -  112/66. Patient has had no complaints of any cardiac type chest pain, palpitations, dyspnea / orthopnea / PND, dizziness, claudication, or dependent edema.     Hyperlipidemia is controlled with diet & meds. Patient denies myalgias or other med SE's. 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  Last A1c was not at goal: Lab Results  Component Value Date   HGBA1C 6.8 (H) 01/12/2018      Further, the patient also has history of Vitamin D Deficiency  ("12"/2009)  and supplements vitamin D without any suspected side-effects. Last vitamin D was still low & not at goal (70-100): Lab Results  Component Value Date   VD25OH 39 09/23/2017   Current Outpatient Medications on File Prior to Visit  Medication Sig  . albuterol (PROVENTIL HFA;VENTOLIN HFA) 108 (90 BASE) MCG/ACT inhaler Inhale 2 puffs into the lungs every 6 (six) hours as needed for wheezing or shortness of breath.  Marland Kitchen alendronate (FOSAMAX) 70 MG tablet Take 1 tablet (70 mg total) by mouth every 7 (seven) days. Take with a full glass of water on an empty stomach.  Marland Kitchen aspirin 81 MG tablet Take 81 mg by mouth daily.    . baclofen 5 MG TABS Take 5 mg  by mouth 3 (three) times daily.  . Cholecalciferol (VITAMIN D3) 2000 UNITS TABS Take 4,000 Units by mouth daily.   . diclofenac sodium (VOLTAREN) 1 % GEL Apply 4 g topically 4 (four) times daily.  . fenofibrate micronized (LOFIBRA) 134 MG capsule TAKE 1 CAPSULE BY MOUTH ONCE DAILY  . fish oil-omega-3 fatty acids 1000 MG capsule Take 1 capsule by mouth 3 (three) times daily.   . Flaxseed, Linseed, (FLAX SEED OIL) 1000 MG CAPS Take 1,000 mg by mouth 3 (three) times daily.   . fluticasone (FLONASE) 50 MCG/ACT nasal spray Place 2 sprays into both nostrils daily.  Marland Kitchen letrozole (FEMARA) 2.5 MG tablet Take 1 tablet (2.5 mg total) by mouth daily. (Patient not taking: Reported on 12/02/2017)  . Magnesium 500 MG TABS Take 500 mg by mouth 2 (two) times daily.   . meloxicam (MOBIC) 15 MG tablet Take one daily with food for 2 weeks, can take with tylenol, can not take with aleve, iburpofen, then as needed daily for pain  . metFORMIN (GLUCOPHAGE-XR) 500 MG 24 hr tablet TAKE FOUR TABLETS BY MOUTH ONCE DAILY  . telmisartan (MICARDIS) 80 MG tablet TAKE 1 TABLET BY MOUTH ONCE DAILY FOR BLOOD PRESSURE   No current facility-administered medications on file prior to visit.  Allergies  Allergen Reactions  . Bisoprolol Other (See Comments)    Decreased Heart Rate  . Ace Inhibitors Cough  . Acyclovir And Related Rash and Other (See Comments)    Okay to take famvir  . Amoxil [Amoxicillin] Diarrhea  . Ciprofloxacin Diarrhea  . Crestor [Rosuvastatin] Other (See Comments)    Elevated LFT's and Myalgias  . Doxycycline Diarrhea  . Enalapril Cough  . Lipitor [Atorvastatin] Other (See Comments)    Elevated LFT's and Myalgias  . Metformin And Related Diarrhea  . Prednisone Other (See Comments)    Intolerant to High Dose Prednisone  . Vicodin [Hydrocodone-Acetaminophen] Other (See Comments)    Increased Anxiety and Nervousness   PMHx:   Past Medical History:  Diagnosis Date  . Allergic rhinitis   . ASCVD  (arteriosclerotic cardiovascular disease)   . Atherosclerosis of aorta ([place]) 2014   via CT AB/Pelvis- no AAA  . COPD (chronic obstructive pulmonary disease) ([place])   . DJD (degenerative joint disease)    neck, lower back, feet, hands  . Elevated hemoglobin A1c measurement   . Tobacco abuse    Immunization History  Administered Date(s) Administered  . Hepatitis B, ped/adol 12/17/2013, 01/17/2014, 07/19/2014  . Influenza Split 05/17/2011, 04/21/2012  . Influenza Whole 04/23/2009, 04/13/2010  . Influenza, High Dose Seasonal PF 06/07/2014, 04/05/2015, 05/07/2016, 04/29/2017  . Influenza,inj,Quad PF,6+ Mos 04/06/2013  . PPD Test 06/07/2014  . Pneumococcal Conjugate-13 06/07/2014  . Pneumococcal Polysaccharide-23 05/17/2011  . Td 07/23/2007   Past Surgical History:  Procedure Laterality Date  . ABDOMINAL HYSTERECTOMY  1976  . BACK SURGERY  2005   lower back  . bladder tact  1985  . BREAST LUMPECTOMY Left 2016  . BREAST LUMPECTOMY WITH RADIOACTIVE SEED LOCALIZATION Left 11/30/2014   Procedure: LEFT BREAST LUMPECTOMY WITH RADIOACTIVE SEED LOCALIZATION;  Surgeon: [name], MD;  Location: [facility];  Service: General;  Laterality: Left;  . BREAST SURGERY  1990   rightmastectomy-axillary nodes  . CHOLECYSTECTOMY  2006  . CHOLECYSTECTOMY N/A 2008   Dr. Rosana Hoes  . excision of benign salivary tumor Left 1968  . EYE SURGERY     bilateral cataract eye surgery - now low vision  . LAPAROSCOPIC BILATERAL SALPINGO OOPHERECTOMY Bilateral 11/30/2013   Procedure: LAPAROSCOPIC RIGHT SALPINGO OOPHORECTOMY with extensive lysis of adhesions;  Surgeon: Allena Katz, MD;  Location: [facility];  Service: Gynecology;  Laterality: Bilateral;  . MASTECTOMY  1990   Right, no adjuvant  . MULTIPLE TOOTH EXTRACTIONS    . SHOULDER SURGERY Left 2007   Dr. Percell Miller   FHx:    Reviewed / unchanged  SHx:    Reviewed / unchanged   Systems Review:  Constitutional: Denies fever, chills, wt changes, headaches,  insomnia, fatigue, night sweats, change in appetite. Eyes: Denies redness, blurred vision, diplopia, discharge, itchy, watery eyes.  ENT: Denies discharge, congestion, post nasal drip, epistaxis, sore throat, earache, hearing loss, dental pain, tinnitus, vertigo, sinus pain, snoring.  CV: Denies chest pain, palpitations, irregular heartbeat, syncope, dyspnea, diaphoresis, orthopnea, PND, claudication or edema. Respiratory: denies cough, dyspnea, DOE, pleurisy, hoarseness, laryngitis, wheezing.  Gastrointestinal: Denies dysphagia, odynophagia, heartburn, reflux, water brash, abdominal pain or cramps, nausea, vomiting, bloating, diarrhea, constipation, hematemesis, melena, hematochezia  or hemorrhoids. Genitourinary: Denies dysuria, frequency, urgency, nocturia, hesitancy, discharge, hematuria or flank pain. Musculoskeletal: Denies arthralgias, myalgias, stiffness, jt. swelling, pain, limping or strain/sprain.  Skin: Denies pruritus, rash, hives, warts, acne, eczema or change in skin lesion(s). Neuro: No weakness, tremor, incoordination, spasms, paresthesia  or pain. Psychiatric: Denies confusion, memory loss or sensory loss. Endo: Denies change in weight, skin or hair change.  Heme/Lymph: No excessive bleeding, bruising or enlarged lymph nodes.  Physical Exam  BP 112/66   Pulse 92   Temp (!) 97.5 F (36.4 C)   Resp 18   Ht 5' 3.25" (1.607 m)   Wt 151 lb 1.6 oz (68.5 kg)   BMI 26.55 kg/m   Appears  well nourished, well groomed  and in no distress.  Eyes: PERRLA, EOMs, conjunctiva no swelling or erythema. Sinuses: No frontal/maxillary tenderness ENT/Mouth: EAC's clear, TM's nl w/o erythema, bulging. Nares clear w/o erythema, swelling, exudates. Oropharynx clear without erythema or exudates. Oral hygiene is good. Tongue normal, non obstructing. Hearing intact.  Neck: Supple. Thyroid not palpable. Car 2+/2+ without bruits, nodes or JVD. Chest: Respirations nl with BS clear & equal w/o  rales, rhonchi, wheezing or stridor.  Cor: Heart sounds normal w/ regular rate and rhythm without sig. murmurs, gallops, clicks or rubs. Peripheral pulses normal and equal  without edema.  Abdomen: Soft & bowel sounds normal. Non-tender w/o guarding, rebound, hernias, masses or organomegaly.  Lymphatics: Unremarkable.  Musculoskeletal: Mild spastic Rt Hemiparesis and limping gait. Skin: Warm, dry without exposed rashes, lesions or ecchymosis apparent.  Neuro: Cranial nerves intact, reflexes equal bilaterally. Sensory decreased distally in the feet. Motor testing grossly intact except mild Rt HP. Tendon reflexes hyper-reflexive Rt.  Pysch: Alert & oriented x 3.  Insight and judgement nl & appropriate. No ideations.  Assessment and Plan:  1. Essential hypertension  - Continue medication, monitor blood pressure at home.  - Continue DASH diet.  Reminder to go to the ER if any CP,  SOB, nausea, dizziness, severe HA, changes vision/speech.  - CBC with Differential/Platelet - COMPLETE METABOLIC PANEL WITH GFR - Magnesium - TSH  2. Hyperlipidemia, mixed  - Continue diet/meds, exercise,& lifestyle modifications.  - Continue monitor periodic cholesterol/liver & renal functions   - Lipid panel  3. Type 2 diabetes mellitus with stage 2 chronic kidney disease, without long-term current use of insulin (HCC)  - Continue diet, exercise, lifestyle modifications.  - Monitor appropriate labs.  - Hemoglobin A1c - Insulin, random  4. Vitamin D deficiency  - Continue supplementation.   - VITAMIN D 25 Hydroxyl  5. C V A / STROKE  6. Medication management  - CBC with Differential/Platelet - COMPLETE METABOLIC PANEL WITH GFR - Magnesium - Lipid panel - TSH - Hemoglobin A1c - Insulin, random - VITAMIN D 25 Hydroxyl  7. Need for prophylactic vaccination and inoculation against influenza  - Flu vaccine HIGH DOSE PF (Fluzone High dose)      Discussed  regular exercise, BP monitoring,  weight control to achieve/maintain BMI less than 25 and discussed med and SE's. Recommended labs to assess and monitor clinical status with further disposition pending results of labs. Over 30 minutes of exam, counseling, chart review was performed.

## 2018-04-17 LAB — COMPLETE METABOLIC PANEL WITH GFR
AG RATIO: 1.4 (calc) (ref 1.0–2.5)
ALT: 36 U/L — AB (ref 6–29)
AST: 35 U/L (ref 10–35)
Albumin: 4.4 g/dL (ref 3.6–5.1)
Alkaline phosphatase (APISO): 75 U/L (ref 33–130)
BUN/Creatinine Ratio: 19 (calc) (ref 6–22)
BUN: 18 mg/dL (ref 7–25)
CALCIUM: 10.5 mg/dL — AB (ref 8.6–10.4)
CO2: 25 mmol/L (ref 20–32)
Chloride: 100 mmol/L (ref 98–110)
Creat: 0.97 mg/dL — ABNORMAL HIGH (ref 0.60–0.93)
GFR, EST AFRICAN AMERICAN: 66 mL/min/{1.73_m2} (ref >=60)
GFR, EST NON AFRICAN AMERICAN: 57 mL/min/{1.73_m2} — AB (ref >=60)
Globulin: 3.2 g/dL (calc) (ref 1.9–3.7)
Glucose, Bld: 108 mg/dL — ABNORMAL HIGH (ref 65–99)
Potassium: 4.2 mmol/L (ref 3.5–5.3)
Sodium: 133 mmol/L — ABNORMAL LOW (ref 135–146)
TOTAL PROTEIN: 7.6 g/dL (ref 6.1–8.1)
Total Bilirubin: 0.5 mg/dL (ref 0.2–1.2)

## 2018-04-17 LAB — CBC WITH DIFFERENTIAL/PLATELET
BASOS ABS: 32 {cells}/uL (ref 0–200)
Basophils Relative: 0.4 %
Eosinophils Absolute: 152 cells/uL (ref 15–500)
Eosinophils Relative: 1.9 %
HEMATOCRIT: 36.9 % (ref 35.0–45.0)
Hemoglobin: 13.2 g/dL (ref 11.7–15.5)
LYMPHS ABS: 4032 {cells}/uL — AB (ref 850–3900)
MCH: 32.4 pg (ref 27.0–33.0)
MCHC: 35.8 g/dL (ref 32.0–36.0)
MCV: 90.4 fL (ref 80.0–100.0)
MPV: 10.1 fL (ref 7.5–12.5)
Monocytes Relative: 8.4 %
NEUTROS PCT: 38.9 %
Neutro Abs: 3112 cells/uL (ref 1500–7800)
PLATELETS: 352 10*3/uL (ref 140–400)
RBC: 4.08 10*6/uL (ref 3.80–5.10)
RDW: 12.3 % (ref 11.0–15.0)
Total Lymphocyte: 50.4 %
WBC: 8 10*3/uL (ref 3.8–10.8)
WBCMIX: 672 {cells}/uL (ref 200–950)

## 2018-04-17 LAB — LIPID PANEL
CHOL/HDL RATIO: 4.9 (calc) (ref <5.0)
Cholesterol: 177 mg/dL (ref <200)
HDL: 36 mg/dL — AB (ref >50)
NON-HDL CHOLESTEROL (CALC): 141 mg/dL — AB (ref <130)
TRIGLYCERIDES: 407 mg/dL — AB (ref <150)

## 2018-04-17 LAB — TSH: TSH: 2.95 mIU/L (ref 0.40–4.50)

## 2018-04-17 LAB — HEMOGLOBIN A1C
Hgb A1c MFr Bld: 6.1 % of total Hgb — ABNORMAL HIGH (ref <5.7)
Mean Plasma Glucose: 128 (calc)
eAG (mmol/L): 7.1 (calc)

## 2018-04-17 LAB — VITAMIN D 25 HYDROXY (VIT D DEFICIENCY, FRACTURES): Vit D, 25-Hydroxy: 26 ng/mL — ABNORMAL LOW (ref 30–100)

## 2018-04-17 LAB — MAGNESIUM: Magnesium: 2.1 mg/dL (ref 1.5–2.5)

## 2018-04-17 LAB — INSULIN, RANDOM: Insulin: 24.4 u[IU]/mL — ABNORMAL HIGH (ref 2.0–19.6)

## 2018-04-19 ENCOUNTER — Encounter: Payer: Self-pay | Admitting: Internal Medicine

## 2018-04-21 ENCOUNTER — Other Ambulatory Visit: Payer: Self-pay | Admitting: Internal Medicine

## 2018-04-21 MED ORDER — DEXAMETHASONE 1 MG PO TABS: ORAL_TABLET | ORAL | 0 refills | Status: DC

## 2018-06-22 ENCOUNTER — Other Ambulatory Visit: Payer: Self-pay | Admitting: Internal Medicine

## 2018-07-15 NOTE — Progress Notes (Signed)
FOLLOW UP  Assessment and Plan:   Hypertension Well controlled with current medications  Monitor blood pressure at home; patient to call if consistently greater than 130/80 Continue DASH diet.   Reminder to go to the ER if any CP, SOB, nausea, dizziness, severe HA, changes vision/speech, left arm numbness and tingling and jaw pain.  Cholesterol Continue fish oil/flax seed supplements, fenofibrate - not on statins secondary to elevated LFT Continue low cholesterol diet and exercise.  Check lipid panel.   Cirrhosis of liver (HCC) Avoid use of tylenol, ETOH; check LFTs  Diabetes with diabetic chronic kidney disease Continue medication: metformin 500 mg 2 tabs BID Continue diet and exercise.  Perform daily foot/skin check, notify office of any concerning changes.  Check A1C  Vitamin D Def/ osteoporosis prevention Continue supplementation as tolerated - she reports constipation at increased doses Check vitamin D levels  Smoking cessation-  instruction/counseling given, counseled patient on the dangers of tobacco use, advised patient to stop smoking, and reviewed strategies to maximize success, patient not ready to quit at this time.   Aortic atherosclerosis (HCC) Control blood pressure, cholesterol, glucose, increase exercise.   COPD exacerbation ([place]) -     DG Chest 2 View; Future -     fluticasone furoate-vilanterol (BREO ELLIPTA) 100-25 MCG/INH AEPB; Inhale 1 puff into the lungs daily. Rinse mouth with water after each use The patient was advised to call immediately if she has any concerning symptoms in the interval. The patient voices understanding of current treatment options and is in agreement with the current care plan.The patient knows to call the clinic with any problems, questions or concerns or go to the ER if any further progression of symptoms.    Left leg swelling -     Uric acid - discussed DVT symptoms at this time more localized to knee, will refer to ortho.   -     Ambulatory referral to [facility] and meds as discussed. Further disposition pending results of labs. Discussed med's effects and SE's.   Over 30 minutes of exam, counseling, chart review, and critical decision making was performed.   Future Appointments  Date Time Provider [place]  10/15/2018  2:00 PM [name], MD [facility] None  01/19/2019  2:00 PM Liane Comber, NP [facility] None    ----------------------------------------------------------------------------------------------------------------------  HPI 77 y.o. female  presents for 3 month follow up on hypertension, cholesterol, diabetes, weight and vitamin D deficiency.   Today she is complaining of some left knee pain x 1 week has been worse, she was given prednisone for her knee which helped the swelling but the pain/swelling came right back 3 months ago. no known injury no fall, no swelling, no changes in sensation. She has been taking ibuprofen around the clock for this which is helping significantly.   she currently continues to smoke 0.5-1.0 pack a day - down from 2 packs; discussed risks associated with smoking, patient is not ready to quit. Last CXR was 04/2016. She has cough with yellow sputum x 4 days, She states her right lung feels heavy when she coughs. Her weight is stable. She has been doing albuterol at least 2 x a day.   BMI is Body mass index is 26.47 kg/m., she has been working on diet and exercise. Wt Readings from Last 3 Encounters:  07/16/18 150 lb 9.6 oz (68.3 kg)  04/16/18 151 lb 1.6 oz (68.5 kg)  01/12/18 153 lb (69.4 kg)   Her blood pressure has  been controlled at home, today their BP is BP: 112/60  She does not workout. She denies chest pain, shortness of breath, dizziness.   She is on cholesterol medication and denies myalgias. Her cholesterol is not at goal. The cholesterol last visit was:   Lab Results  Component Value Date   CHOL 177 04/16/2018   HDL  36 (L) 04/16/2018   [place]  04/16/2018     Comment:     . LDL cholesterol not calculated. Triglyceride levels greater than 400 mg/dL invalidate calculated LDL results. . Reference range: <100 . Desirable range <100 mg/dL for primary prevention;   <70 mg/dL for patients with CHD or diabetic patients  with > or = 2 CHD risk factors. Marland Kitchen LDL-C is now calculated using the Martin-Hopkins  calculation, which is a validated novel method providing  better accuracy than the Friedewald equation in the  estimation of LDL-C.  Cresenciano Genre et al. Annamaria Helling. 1287;867(67): 2061-2068  (http://education.[employer].com/faq/FAQ164)    TRIG 407 (H) 04/16/2018   CHOLHDL 4.9 04/16/2018    She has been working on diet and exercise for type 2 diabetes, and denies nausea, paresthesia of the feet, polydipsia, polyuria, visual disturbances and vomiting. Last A1C in the office was:  Lab Results  Component Value Date   HGBA1C 6.1 (H) 04/16/2018   Patient is on Vitamin D supplement but remains below goal:    Lab Results  Component Value Date   VD25OH 26 (L) 04/16/2018      Current Medications:  Current Outpatient Medications on File Prior to Visit  Medication Sig  . albuterol (PROVENTIL HFA;VENTOLIN HFA) 108 (90 BASE) MCG/ACT inhaler Inhale 2 puffs into the lungs every 6 (six) hours as needed for wheezing or shortness of breath.  Marland Kitchen alendronate (FOSAMAX) 70 MG tablet Take 1 tablet (70 mg total) by mouth every 7 (seven) days. Take with a full glass of water on an empty stomach.  Marland Kitchen aspirin 81 MG tablet Take 81 mg by mouth daily.    . baclofen 5 MG TABS Take 5 mg by mouth 3 (three) times daily.  . Cholecalciferol (VITAMIN D3) 2000 UNITS TABS Take 4,000 Units by mouth daily.   Marland Kitchen dexamethasone (DECADRON) 1 MG tablet Take 1 tablet 3 x /day for 3 days - 2 x /day for 3 days - then 1 x /day  . diclofenac sodium (VOLTAREN) 1 % GEL Apply 4 g topically 4 (four) times daily.  . fenofibrate micronized (LOFIBRA) 134 MG  capsule TAKE 1 CAPSULE BY MOUTH ONCE DAILY  . fish oil-omega-3 fatty acids 1000 MG capsule Take 1 capsule by mouth 3 (three) times daily.   . Flaxseed, Linseed, (FLAX SEED OIL) 1000 MG CAPS Take 1,000 mg by mouth 3 (three) times daily.   . fluticasone (FLONASE) 50 MCG/ACT nasal spray Place 2 sprays into both nostrils daily.  . Magnesium 500 MG TABS Take 500 mg by mouth 2 (two) times daily.   . meloxicam (MOBIC) 15 MG tablet Take one daily with food for 2 weeks, can take with tylenol, can not take with aleve, iburpofen, then as needed daily for pain  . metFORMIN (GLUCOPHAGE-XR) 500 MG 24 hr tablet Take 2 tablets 2 x /day with meals for Diabetes  . telmisartan (MICARDIS) 80 MG tablet TAKE 1 TABLET BY MOUTH ONCE DAILY FOR BLOOD PRESSURE   No current facility-administered medications on file prior to visit.      Allergies:  Allergies  Allergen Reactions  . Bisoprolol Other (See Comments)  Decreased Heart Rate  . Ace Inhibitors Cough  . Acyclovir And Related Rash and Other (See Comments)    Okay to take famvir  . Amoxil [Amoxicillin] Diarrhea  . Ciprofloxacin Diarrhea  . Crestor [Rosuvastatin] Other (See Comments)    Elevated LFT's and Myalgias  . Doxycycline Diarrhea  . Enalapril Cough  . Lipitor [Atorvastatin] Other (See Comments)    Elevated LFT's and Myalgias  . Metformin And Related Diarrhea  . Prednisone Other (See Comments)    Intolerant to High Dose Prednisone  . Vicodin [Hydrocodone-Acetaminophen] Other (See Comments)    Increased Anxiety and Nervousness     Medical History:  Past Medical History:  Diagnosis Date  . Allergic rhinitis   . ASCVD (arteriosclerotic cardiovascular disease)   . Atherosclerosis of aorta ([place]) 2014   via CT AB/Pelvis- no AAA  . COPD (chronic obstructive pulmonary disease) ([place])   . DJD (degenerative joint disease)    neck, lower back, feet, hands  . Elevated hemoglobin A1c measurement   . Tobacco abuse    Family history- Reviewed and  unchanged Social history- Reviewed and unchanged   Review of Systems:  Review of Systems  Constitutional: Negative for malaise/fatigue and weight loss.  HENT: Negative for hearing loss and tinnitus.   Eyes: Negative for blurred vision and double vision.  Respiratory: Positive for cough, sputum production and shortness of breath. Negative for hemoptysis and wheezing.   Cardiovascular: Positive for leg swelling (left leg with knee pain). Negative for chest pain, palpitations, orthopnea, claudication and PND.  Gastrointestinal: Negative for abdominal pain, blood in stool, constipation, diarrhea, heartburn, melena, nausea and vomiting.  Genitourinary: Negative.   Musculoskeletal: Positive for joint pain. Negative for myalgias.  Skin: Negative for rash.  Neurological: Negative for dizziness, tingling, sensory change, weakness and headaches.  Endo/Heme/Allergies: Negative for polydipsia.  Psychiatric/Behavioral: Negative.   All other systems reviewed and are negative.   Physical Exam: BP 112/60   Pulse 100   Temp 98.6 F (37 C)   Ht 5' 3.25" (1.607 m)   Wt 150 lb 9.6 oz (68.3 kg)   SpO2 96%   BMI 26.47 kg/m  Wt Readings from Last 3 Encounters:  07/16/18 150 lb 9.6 oz (68.3 kg)  04/16/18 151 lb 1.6 oz (68.5 kg)  01/12/18 153 lb (69.4 kg)   General Appearance: Well nourished, in no apparent distress. Eyes: Legally blind bilateral eyes, conjunctiva no swelling or erythema Sinuses: No Frontal/maxillary tenderness ENT/Mouth: Ext aud canals clear, TMs without erythema, bulging. No erythema, swelling, or exudate on post pharynx.  Tonsils not swollen or erythematous. Hearing normal.  Neck: Supple, thyroid normal.  Respiratory: Respiratory effort normal, BS equal bilaterally with wheezing without rales, rhonchi,  or stridor.  Cardio: RRR with prominent S2, no MRGs. Brisk peripheral pulses with mild edema left leg more than right, no hard cord, no redness, warmth. Negative homen's sign.   Abdomen: Soft, + BS.  Non tender, no guarding, rebound, hernias, masses. Lymphatics: Non tender without lymphadenopathy.  Musculoskeletal: Full ROM, 5/5 strength, antalgic gait with some warmth/swelling at medial left knee. No redness no effusion.  Skin: Warm, dry without rashes, lesions, ecchymosis.  Neuro: Cranial nerves intact. No cerebellar symptoms.  Psych: Awake and oriented X 3, normal affect, Insight and Judgment appropriate.    Vicie Mutters, PA-C 3:43 PM [facility]

## 2018-07-16 ENCOUNTER — Ambulatory Visit (INDEPENDENT_AMBULATORY_CARE_PROVIDER_SITE_OTHER): Payer: PPO | Admitting: Physician Assistant

## 2018-07-16 ENCOUNTER — Encounter: Payer: Self-pay | Admitting: Physician Assistant

## 2018-07-16 VITALS — BP 112/60 | HR 100 | Temp 98.6°F | Ht 63.25 in | Wt 150.6 lb

## 2018-07-16 DIAGNOSIS — I10 Essential (primary) hypertension: Secondary | ICD-10-CM

## 2018-07-16 DIAGNOSIS — G8929 Other chronic pain: Secondary | ICD-10-CM

## 2018-07-16 DIAGNOSIS — I2510 Atherosclerotic heart disease of native coronary artery without angina pectoris: Secondary | ICD-10-CM | POA: Diagnosis not present

## 2018-07-16 DIAGNOSIS — M7989 Other specified soft tissue disorders: Secondary | ICD-10-CM

## 2018-07-16 DIAGNOSIS — N182 Chronic kidney disease, stage 2 (mild): Secondary | ICD-10-CM

## 2018-07-16 DIAGNOSIS — E782 Mixed hyperlipidemia: Secondary | ICD-10-CM

## 2018-07-16 DIAGNOSIS — E1122 Type 2 diabetes mellitus with diabetic chronic kidney disease: Secondary | ICD-10-CM | POA: Diagnosis not present

## 2018-07-16 DIAGNOSIS — J441 Chronic obstructive pulmonary disease with (acute) exacerbation: Secondary | ICD-10-CM | POA: Diagnosis not present

## 2018-07-16 DIAGNOSIS — K7469 Other cirrhosis of liver: Secondary | ICD-10-CM

## 2018-07-16 DIAGNOSIS — I700 Atherosclerosis of aorta: Secondary | ICD-10-CM | POA: Diagnosis not present

## 2018-07-16 DIAGNOSIS — Z79899 Other long term (current) drug therapy: Secondary | ICD-10-CM

## 2018-07-16 DIAGNOSIS — M25562 Pain in left knee: Secondary | ICD-10-CM | POA: Diagnosis not present

## 2018-07-16 MED ORDER — FLUTICASONE FUROATE-VILANTEROL 100-25 MCG/INH IN AEPB: 1.0000 | INHALATION_SPRAY | Freq: Every day | RESPIRATORY_TRACT | 0 refills | Status: AC

## 2018-07-16 NOTE — Patient Instructions (Addendum)
INFORMATION ABOUT YOUR XRAY  [facility] [place], go to radiology and give them your name. They will have the order and take you back. You do not any paper work, I should get the result back today or tomorrow. This order is good for a year.    Chronic Obstructive Pulmonary Disease Exacerbation Chronic obstructive pulmonary disease (COPD) is a long-term (chronic) lung problem. In COPD, the flow of air from the lungs is limited. COPD exacerbations are times that breathing gets worse and you need more than your normal treatment. Without treatment, they can be life threatening. If they happen often, your lungs can become more damaged. If your COPD gets worse, your doctor may treat you with:  Medicines.  Oxygen.  Different ways to clear your airway, such as using a mask. Follow these instructions at home: Medicines  Take over-the-counter and prescription medicines only as told by your doctor.  If you take an antibiotic or steroid medicine, do not stop taking the medicine even if you start to feel better.  Keep up with shots (vaccinations) as told by your doctor. Be sure to get a yearly (annual) flu shot. Lifestyle  Do not smoke. If you need help quitting, ask your doctor.  Eat healthy foods.  Exercise regularly.  Get plenty of sleep.  Avoid tobacco smoke and other things that can bother your lungs.  Wash your hands often with soap and water. This will help keep you from getting an infection. If you cannot use soap and water, use hand sanitizer.  During flu season, avoid areas that are crowded with people. General instructions  Drink enough fluid to keep your pee (urine) clear or pale yellow. Do not do this if your doctor has told you not to.  Use a cool mist machine (vaporizer).  If you use oxygen or a machine that turns medicine into a mist (nebulizer), continue to use it as told.  Follow all instructions for rehabilitation. These are steps you can take to make  your body work better.  Keep all follow-up visits as told by your doctor. This is important. Contact a doctor if:  Your COPD symptoms get worse than normal. Get help right away if:  You are short of breath and it gets worse.  You have trouble talking.  You have chest pain.  You cough up blood.  You have a fever.  You keep throwing up (vomiting).  You feel weak or you pass out (faint).  You feel confused.  You are not able to sleep because of your symptoms.  You are not able to do daily activities. Summary  COPD exacerbations are times that breathing gets worse and you need more treatment than normal.  COPD exacerbations can be very serious and may cause your lungs to become more damaged.  Do not smoke. If you need help quitting, ask your doctor.  Stay up-to-date on your shots. Get a flu shot every year. This information is not intended to replace advice given to you by your health care provider. Make sure you discuss any questions you have with your health care provider. Document Released: 06/27/2011 Document Revised: 08/12/2016 Document Reviewed: 08/12/2016 Elsevier Interactive Patient Education  2019 [place].   Deep Vein Thrombosis  Deep vein thrombosis (DVT) is a condition in which a blood clot forms in a deep vein, such as a lower leg, thigh, or arm vein. A clot is blood that has thickened into a gel or solid. This condition is dangerous.  It can lead to serious and even life-threatening complications if the clot travels to the lungs and causes a blockage (pulmonary embolism). It can also damage veins in the leg. This can result in leg pain, swelling, discoloration, and sores (post-thrombotic syndrome). What are the causes? This condition may be caused by:  A slowdown of blood flow.  Damage to a vein.  A condition that causes blood to clot more easily, such as an inherited clotting disorder. What increases the risk? The following factors may make you more  likely to develop this condition:  Being overweight.  Being older, especially over age 43.  Sitting or lying down for more than four hours.  Being in the hospital.  Lack of physical activity (sedentary lifestyle).  Pregnancy, being in childbirth, or having recently given birth.  Taking medicines that contain estrogen, such as medicines to prevent pregnancy.  Smoking.  A history of any of the following: ? Blood clots or a blood clotting disease. ? Peripheral vascular disease. ? Inflammatory bowel disease. ? Cancer. ? Heart disease. ? Genetic conditions that affect how your blood clots, such as Factor V Leiden mutation. ? Neurological diseases that affect your legs (leg paresis). ? A recent injury, such as a car accident. ? Major or lengthy surgery. ? A central line placed inside a large vein. What are the signs or symptoms? Symptoms of this condition include:  Swelling, pain, or tenderness in an arm or leg.  Warmth, redness, or discoloration in an arm or leg. If the clot is in your leg, symptoms may be more noticeable or worse when you stand or walk. Some people may not develop any symptoms. How is this diagnosed? This condition is diagnosed with:  A medical history and physical exam.  Tests, such as: ? Blood tests. These are done to check how well your blood clots. ? Ultrasound. This is done to check for clots. ? Venogram. For this test, contrast dye is injected into a vein and X-rays are taken to check for any clots.   Get help right away if:  You have: ? New or increased pain, swelling, or redness in an arm or leg. ? Numbness or tingling in an arm or leg. ? Shortness of breath. ? Chest pain. ? A rapid or irregular heartbeat. ? A severe headache or confusion. ? A cut that will not stop bleeding.  There is blood in your vomit, stool, or urine.  You have a serious fall or accident, or you hit your head.  You feel light-headed or dizzy.  You cough up  blood. These symptoms may represent a serious problem that is an emergency. Do not wait to see if the symptoms will go away. Get medical help right away. Call your local emergency services (911 in the [place] Do not drive yourself to the hospital. Summary  Deep vein thrombosis (DVT) is a condition in which a blood clot forms in a deep vein, such as a lower leg, thigh, or arm vein.  Symptoms can include swelling, warmth, pain, and redness in your leg or arm.  This condition may be treated with a blood thinner (anticoagulant medicine), medicine that is injected to dissolve blood clots,compression stockings, or surgery.  If you are prescribed blood thinners, take them exactly as told. This information is not intended to replace advice given to you by your health care provider. Make sure you discuss any questions you have with your health care provider. Document Released: 07/08/2005 Document Revised: 12/06/2016 Document Reviewed: 12/06/2016  Chartered certified accountant Patient Education  [employer].

## 2018-07-17 LAB — CBC WITH DIFFERENTIAL/PLATELET
Absolute Monocytes: 836 cells/uL (ref 200–950)
BASOS ABS: 45 {cells}/uL (ref 0–200)
Basophils Relative: 0.4 %
EOS ABS: 147 {cells}/uL (ref 15–500)
EOS PCT: 1.3 %
HEMATOCRIT: 36.7 % (ref 35.0–45.0)
Hemoglobin: 12.5 g/dL (ref 11.7–15.5)
LYMPHS ABS: 4418 {cells}/uL — AB (ref 850–3900)
MCH: 31.4 pg (ref 27.0–33.0)
MCHC: 34.1 g/dL (ref 32.0–36.0)
MCV: 92.2 fL (ref 80.0–100.0)
MONOS PCT: 7.4 %
MPV: 9.9 fL (ref 7.5–12.5)
NEUTROS PCT: 51.8 %
Neutro Abs: 5853 cells/uL (ref 1500–7800)
Platelets: 388 10*3/uL (ref 140–400)
RBC: 3.98 10*6/uL (ref 3.80–5.10)
RDW: 12.6 % (ref 11.0–15.0)
Total Lymphocyte: 39.1 %
WBC: 11.3 10*3/uL — ABNORMAL HIGH (ref 3.8–10.8)

## 2018-07-17 LAB — MAGNESIUM: MAGNESIUM: 2.1 mg/dL (ref 1.5–2.5)

## 2018-07-17 LAB — COMPLETE METABOLIC PANEL WITH GFR
AG Ratio: 1.3 (calc) (ref 1.0–2.5)
ALBUMIN MSPROF: 4.2 g/dL (ref 3.6–5.1)
ALKALINE PHOSPHATASE (APISO): 70 U/L (ref 33–130)
ALT: 18 U/L (ref 6–29)
AST: 18 U/L (ref 10–35)
BILIRUBIN TOTAL: 0.4 mg/dL (ref 0.2–1.2)
BUN: 20 mg/dL (ref 7–25)
CHLORIDE: 104 mmol/L (ref 98–110)
CO2: 25 mmol/L (ref 20–32)
CREATININE: 0.83 mg/dL (ref 0.60–0.93)
Calcium: 10.8 mg/dL — ABNORMAL HIGH (ref 8.6–10.4)
GFR, Est African American: 79 mL/min/{1.73_m2} (ref >=60)
GFR, Est Non African American: 68 mL/min/{1.73_m2} (ref >=60)
GLOBULIN: 3.3 g/dL (ref 1.9–3.7)
Glucose, Bld: 90 mg/dL (ref 65–99)
POTASSIUM: 4.7 mmol/L (ref 3.5–5.3)
SODIUM: 137 mmol/L (ref 135–146)
Total Protein: 7.5 g/dL (ref 6.1–8.1)

## 2018-07-17 LAB — HEMOGLOBIN A1C
HEMOGLOBIN A1C: 6.2 %{Hb} — AB (ref <5.7)
Mean Plasma Glucose: 131 (calc)
eAG (mmol/L): 7.3 (calc)

## 2018-07-17 LAB — LIPID PANEL
CHOLESTEROL: 157 mg/dL (ref <200)
HDL: 39 mg/dL — ABNORMAL LOW (ref >50)
LDL Cholesterol (Calc): 81 mg/dL (calc)
Non-HDL Cholesterol (Calc): 118 mg/dL (calc) (ref <130)
Total CHOL/HDL Ratio: 4 (calc) (ref <5.0)
Triglycerides: 311 mg/dL — ABNORMAL HIGH (ref <150)

## 2018-07-17 LAB — URIC ACID: URIC ACID, SERUM: 3.8 mg/dL (ref 2.5–7.0)

## 2018-07-17 LAB — TSH: TSH: 2.52 m[IU]/L (ref 0.40–4.50)

## 2018-07-17 NOTE — Addendum Note (Signed)
Addended by: Vicie Mutters R on: 07/17/2018 08:25 AM   Modules accepted: Orders

## 2018-08-03 DIAGNOSIS — M25562 Pain in left knee: Secondary | ICD-10-CM | POA: Diagnosis not present

## 2018-09-14 ENCOUNTER — Ambulatory Visit (HOSPITAL_COMMUNITY)
Admission: RE | Admit: 2018-09-14 | Discharge: 2018-09-14 | Disposition: A | Discharge status: Home / Self Care | Payer: PPO | Source: Ambulatory Visit | Attending: Physician Assistant | Admitting: Physician Assistant

## 2018-09-14 DIAGNOSIS — R05 Cough: Secondary | ICD-10-CM | POA: Diagnosis not present

## 2018-09-14 DIAGNOSIS — J441 Chronic obstructive pulmonary disease with (acute) exacerbation: Secondary | ICD-10-CM | POA: Diagnosis not present

## 2018-09-15 ENCOUNTER — Encounter: Payer: Self-pay | Admitting: Physician Assistant

## 2018-09-15 DIAGNOSIS — J449 Chronic obstructive pulmonary disease, unspecified: Secondary | ICD-10-CM | POA: Insufficient documentation

## 2018-10-11 ENCOUNTER — Other Ambulatory Visit: Payer: Self-pay | Admitting: Internal Medicine

## 2018-10-13 ENCOUNTER — Other Ambulatory Visit: Payer: Self-pay | Admitting: Internal Medicine

## 2018-10-13 DIAGNOSIS — Z853 Personal history of malignant neoplasm of breast: Secondary | ICD-10-CM

## 2018-10-14 DIAGNOSIS — M25562 Pain in left knee: Secondary | ICD-10-CM | POA: Diagnosis not present

## 2018-10-15 ENCOUNTER — Encounter: Payer: Self-pay | Admitting: Internal Medicine

## 2018-10-26 ENCOUNTER — Encounter: Payer: Self-pay | Admitting: Internal Medicine

## 2019-01-13 ENCOUNTER — Encounter: Payer: Self-pay | Admitting: Internal Medicine

## 2019-01-19 ENCOUNTER — Ambulatory Visit: Payer: Self-pay | Admitting: Adult Health

## 2019-02-22 ENCOUNTER — Other Ambulatory Visit: Payer: Self-pay

## 2019-02-22 ENCOUNTER — Ambulatory Visit (INDEPENDENT_AMBULATORY_CARE_PROVIDER_SITE_OTHER): Payer: PPO | Admitting: Adult Health

## 2019-02-22 ENCOUNTER — Encounter: Payer: Self-pay | Admitting: Adult Health

## 2019-02-22 VITALS — BP 108/68 | HR 81 | Temp 96.1°F | Wt 147.0 lb

## 2019-02-22 DIAGNOSIS — I2510 Atherosclerotic heart disease of native coronary artery without angina pectoris: Secondary | ICD-10-CM

## 2019-02-22 DIAGNOSIS — K760 Fatty (change of) liver, not elsewhere classified: Secondary | ICD-10-CM

## 2019-02-22 DIAGNOSIS — I10 Essential (primary) hypertension: Secondary | ICD-10-CM

## 2019-02-22 DIAGNOSIS — R1011 Right upper quadrant pain: Secondary | ICD-10-CM

## 2019-02-22 DIAGNOSIS — K7469 Other cirrhosis of liver: Secondary | ICD-10-CM | POA: Diagnosis not present

## 2019-02-22 MED ORDER — SUCRALFATE 1 G PO TABS: 1.0000 g | ORAL_TABLET | Freq: Four times a day (QID) | ORAL | 1 refills | Status: AC

## 2019-02-22 MED ORDER — BUSPIRONE HCL 5 MG PO TABS: 5.0000 mg | ORAL_TABLET | Freq: Three times a day (TID) | ORAL | 0 refills | Status: DC

## 2019-02-22 NOTE — Progress Notes (Signed)
Assessment and Plan:  Raechal was seen today for arm pain.  Diagnoses and all orders for this visit:  RUQ abdominal pain RUQ/R lower chest pain, generalized abd tenderness - unclear etiology  Mild tachypnea; smoker; VS normal - intermittent cyclical sx not aggravated by exertion, not suggestive of cardiac etiology, EKG normal, recheck CXR due to decreased RUL sounds and mild tachypnea Generalized abd pain, worst epigastric, check labs today, schedule [place]  Consider CT if persistent and unexplained Improved with PPI, advised restart omeprazole 20 mg BID, add carafate pending labs The patient was advised to call immediately if she has any concerning symptoms in the interval. The patient voices understanding of current treatment options and is in agreement with the current care plan.The patient knows to call the clinic with any problems, questions or concerns or go to the ER if any further progression of symptoms.  -     CBC with Differential/Platelet  -     COMPLETE METABOLIC PANEL WITH GFR -     Amylase -     Lipase -     Urinalysis w microscopic + reflex cultur -     EKG 12-Lead -     Abd [place] -     DG Chest 2 View; Future  Other orders -     sucralfate (CARAFATE) 1 g tablet; Take 1 tablet (1 g total) by mouth 4 (four) times daily. Take with glass of water, or dissolve in 4 oz of water. Take before each meal and prior to bedtime. -     busPIRone (BUSPAR) 5 MG tablet; Take 1 tablet (5 mg total) by mouth 3 (three) times daily. For anxiety.  Further disposition pending results of labs. Discussed med's effects and SE's.   Over 30 minutes of exam, counseling, chart review, and critical decision making was performed.   Future Appointments  Date Time Provider [place]  02/24/2019  1:30 PM [facility]  04/08/2019  3:45 PM [name], MD [facility] None     ------------------------------------------------------------------------------------------------------------------   HPI BP 108/68   Pulse 81   Temp (!) 96.1 F (35.6 C)   Wt 147 lb (66.7 kg)   SpO2 97%   BMI 25.83 kg/m   77 y.o.female presents for right arm, right chest and abdominal pain that she reports gradually began 2 weeks ago and gradually becoming worse in the last 3 days. She reports began mainly with RUQ and shoots up into her chest, also having R shoulder pain (though admits this feels "achy" typical for arthritis).   Describes as primarily RUQ, sharp, stabbing, shoots up into chest/breast area with breathing; reports pain is intermittent, 9/10, short, brief, every 30 min or so, lasts for 2-3 min, resolves spontaneously. She reports "bracing" area seems to help some.   Denies chest pressure, tightness, n/v/d; she does report intermittent constipation ongoing and unchanged. She endorses increased gassiness and belching recently. Denies urinary changes. Stool frequency and character is unchanged. She does feel that eating sometimes aggravates her abdominal pain.   She has hx of ? Cirrhosis, saw GI but hasn't followed up recently. Had colonoscopy and EGD in 2015 which showed mod diverticulosis   CT abd in 10/2015 showed:  Sigmoid diverticulosis without findings for acute diverticulitis. Status post cholecystectomy.  No biliary dilatation. Diffuse fatty infiltration of liver. Pancreatic divisum.  Has been taking tylenol 1 tab at night without notable improvement  She did try taking omeprazole tab daily for a few days with ?  Improvement in some symptoms - bloating, etc but not with pain episodes  Hx of R sided breast cancer s/p mastectomy with lymph resection  Had essentially normal CXR in 08/2018 - current smoker, 1 PPD, denies wheezing, coughing, fever/chills  She does follow with Dr. Raliegh Ip for arthritis  Son accompanies her today, reports his dad passed away  just a few months ago, questions if anxiety might be contributing, requests non-sedating/non-addicting agent  Past Medical History:  Diagnosis Date  . Allergic rhinitis   . ASCVD (arteriosclerotic cardiovascular disease)   . Atherosclerosis of aorta ([place]) 2014   via CT AB/Pelvis- no AAA  . COPD (chronic obstructive pulmonary disease) ([place])   . DJD (degenerative joint disease)    neck, lower back, feet, hands  . Elevated hemoglobin A1c measurement   . Tobacco abuse      Allergies  Allergen Reactions  . Bisoprolol Other (See Comments)    Decreased Heart Rate  . Ace Inhibitors Cough  . Acyclovir And Related Rash and Other (See Comments)    Okay to take famvir  . Amoxil [Amoxicillin] Diarrhea  . Ciprofloxacin Diarrhea  . Crestor [Rosuvastatin] Other (See Comments)    Elevated LFT's and Myalgias  . Doxycycline Diarrhea  . Enalapril Cough  . Lipitor [Atorvastatin] Other (See Comments)    Elevated LFT's and Myalgias  . Metformin And Related Diarrhea  . Prednisone Other (See Comments)    Intolerant to High Dose Prednisone  . Vicodin [Hydrocodone-Acetaminophen] Other (See Comments)    Increased Anxiety and Nervousness    Current Outpatient Medications on File Prior to Visit  Medication Sig  . albuterol (PROVENTIL HFA;VENTOLIN HFA) 108 (90 BASE) MCG/ACT inhaler Inhale 2 puffs into the lungs every 6 (six) hours as needed for wheezing or shortness of breath.  Marland Kitchen aspirin 81 MG tablet Take 81 mg by mouth daily.    . diclofenac sodium (VOLTAREN) 1 % GEL Apply 4 g topically 4 (four) times daily. (Patient taking differently: Apply 4 g topically as needed. )  . fenofibrate micronized (LOFIBRA) 134 MG capsule Take 1 capsule by mouth once daily  . fish oil-omega-3 fatty acids 1000 MG capsule Take 1 capsule by mouth 3 (three) times daily.   . Flaxseed, Linseed, (FLAX SEED OIL) 1000 MG CAPS Take 1,000 mg by mouth 3 (three) times daily.   . fluticasone (FLONASE) 50 MCG/ACT nasal spray Place 2  sprays into both nostrils daily. (Patient taking differently: Place 2 sprays into both nostrils as needed. )  . fluticasone furoate-vilanterol (BREO ELLIPTA) 100-25 MCG/INH AEPB Inhale 1 puff into the lungs daily. Rinse mouth with water after each use (Patient taking differently: Inhale 1 puff into the lungs as needed. Rinse mouth with water after each use)  . Magnesium 500 MG TABS Take 500 mg by mouth 2 (two) times daily.   . metFORMIN (GLUCOPHAGE-XR) 500 MG 24 hr tablet Take 2 tablets 2 x /day with meals for Diabetes  . telmisartan (MICARDIS) 80 MG tablet Take 1 tablet by mouth once daily for blood pressure  . alendronate (FOSAMAX) 70 MG tablet Take 1 tablet (70 mg total) by mouth every 7 (seven) days. Take with a full glass of water on an empty stomach. (Patient not taking: Reported on 02/22/2019)  . Cholecalciferol (VITAMIN D3) 2000 UNITS TABS Take 4,000 Units by mouth daily.   Marland Kitchen dexamethasone (DECADRON) 1 MG tablet Take 1 tablet 3 x /day for 3 days - 2 x /day for 3 days - then 1 x /day  .  meloxicam (MOBIC) 15 MG tablet Take one daily with food for 2 weeks, can take with tylenol, can not take with aleve, iburpofen, then as needed daily for pain (Patient not taking: Reported on 02/22/2019)   No current facility-administered medications on file prior to visit.     ROS: all negative except above.   Physical Exam:  BP 108/68   Pulse 81   Temp (!) 96.1 F (35.6 C)   Wt 147 lb (66.7 kg)   SpO2 97%   BMI 25.83 kg/m   General Appearance: Well nourished, in no acute distress. Eyes: conjunctiva no swelling or erythema Sinuses: No Frontal/maxillary tenderness ENT/Mouth: Ext aud canals clear, TMs without erythema, bulging. No erythema, swelling, or exudate on post pharynx.  Tonsils not swollen or erythematous. Hearing normal.  Neck: Supple, thyroid normal.  Respiratory: Respiratory effort normal, mildly tachypneic, BS  without rales, rhonchi, wheezing or stridor, lung sounds notably diminished in  RUL; trachea midline Cardio: irregular rhythm with no MRGs. Brisk peripheral pulses without edema.  Abdomen: Soft, + BS x 4, somewhat hyperactive. Generalized tenderness, worse epigastric, - murphy's, no guarding, rebound, palpable hernias, masses. Lymphatics: Non tender without lymphadenopathy.  Musculoskeletal: No obvious deformity, full passive ROM R shoulder, symmetrical strength, slow gait short distances, in wheelchair; she does have R chest wall tenderness - lower anterior  Breast: R s/p mastectomy with healed surgical scar, no distinct palpable lumps some irregular texture throughout  Skin: Warm, dry without rashes, lesions, ecchymosis.  Neuro: Cranial nerves intact. Normal muscle tone, no cerebellar symptoms. Sensation intact.  Psych: Awake and oriented X 3, normal affect, Insight and Judgment appropriate.     Izora Ribas, NP 3:45 PM [facility]

## 2019-02-22 NOTE — Patient Instructions (Signed)
Go to the ER if any chest pain, shortness of breath, nausea, dizziness, severe HA, changes vision/speech  Please go to the ER if you have any severe AB pain, unable to hold down food/water, blood in stool or vomit, chest pain, shortness of breath, or any worsening symptoms.    Take omeprazole 20 mg twice daily x 2 weeks, carafate 1 tab before each meal and before bedtime (can take as tab with glass of water or dissolve in 4 oz of water and drink)  Get chest xray tomorrow morning at imaging center - [address]   Abdominal Pain, Adult Abdominal pain can be caused by many things. Often, abdominal pain is not serious and it gets better with no treatment or by being treated at home. However, sometimes abdominal pain is serious. Your health care provider will do a medical history and a physical exam to try to determine the cause of your abdominal pain. Follow these instructions at home:  Take over-the-counter and prescription medicines only as told by your health care provider. Do not take a laxative unless told by your health care provider.  Drink enough fluid to keep your urine clear or pale yellow.  Watch your condition for any changes.  Keep all follow-up visits as told by your health care provider. This is important. Contact a health care provider if:  Your abdominal pain changes or gets worse.  You are not hungry or you lose weight without trying.  You are constipated or have diarrhea for more than 2-3 days.  You have pain when you urinate or have a bowel movement.  Your abdominal pain wakes you up at night.  Your pain gets worse with meals, after eating, or with certain foods.  You are throwing up and cannot keep anything down.  You have a fever. Get help right away if:  Your pain does not go away as soon as your health care provider told you to expect.  You cannot stop throwing up.  Your pain is only in areas of the abdomen, such as the right side or the left lower  portion of the abdomen.  You have bloody or black stools, or stools that look like tar.  You have severe pain, cramping, or bloating in your abdomen.  You have signs of dehydration, such as: ? Dark urine, very little urine, or no urine. ? Cracked lips. ? Dry mouth. ? Sunken eyes. ? Sleepiness. ? Weakness. This information is not intended to replace advice given to you by your health care provider. Make sure you discuss any questions you have with your health care provider. Document Released: 04/17/2005 Document Revised: 01/26/2016 Document Reviewed: 12/20/2015 Elsevier Interactive Patient Education  [employer].

## 2019-02-23 ENCOUNTER — Ambulatory Visit
Admission: RE | Admit: 2019-02-23 | Discharge: 2019-02-23 | Disposition: A | Discharge status: Home / Self Care | Payer: PPO | Source: Ambulatory Visit | Attending: Adult Health | Admitting: Adult Health

## 2019-02-23 DIAGNOSIS — R0602 Shortness of breath: Secondary | ICD-10-CM | POA: Diagnosis not present

## 2019-02-23 DIAGNOSIS — R1011 Right upper quadrant pain: Secondary | ICD-10-CM

## 2019-02-24 ENCOUNTER — Ambulatory Visit: Payer: PPO | Admitting: Physician Assistant

## 2019-02-24 LAB — CBC WITH DIFFERENTIAL/PLATELET
Absolute Monocytes: 856 cells/uL (ref 200–950)
Basophils Absolute: 64 cells/uL (ref 0–200)
Basophils Relative: 0.6 %
Eosinophils Absolute: 235 cells/uL (ref 15–500)
Eosinophils Relative: 2.2 %
HCT: 38.8 % (ref 35.0–45.0)
Hemoglobin: 13.3 g/dL (ref 11.7–15.5)
Lymphs Abs: 4120 cells/uL — ABNORMAL HIGH (ref 850–3900)
MCH: 31 pg (ref 27.0–33.0)
MCHC: 34.3 g/dL (ref 32.0–36.0)
MCV: 90.4 fL (ref 80.0–100.0)
MPV: 9.7 fL (ref 7.5–12.5)
Monocytes Relative: 8 %
Neutro Abs: 5425 cells/uL (ref 1500–7800)
Neutrophils Relative %: 50.7 %
Platelets: 390 10*3/uL (ref 140–400)
RBC: 4.29 10*6/uL (ref 3.80–5.10)
RDW: 13.1 % (ref 11.0–15.0)
Total Lymphocyte: 38.5 %
WBC: 10.7 10*3/uL (ref 3.8–10.8)

## 2019-02-24 LAB — COMPLETE METABOLIC PANEL WITH GFR
AG Ratio: 1.3 (calc) (ref 1.0–2.5)
ALT: 16 U/L (ref 6–29)
AST: 17 U/L (ref 10–35)
Albumin: 4.3 g/dL (ref 3.6–5.1)
Alkaline phosphatase (APISO): 71 U/L (ref 37–153)
BUN: 13 mg/dL (ref 7–25)
CO2: 22 mmol/L (ref 20–32)
Calcium: 10.5 mg/dL — ABNORMAL HIGH (ref 8.6–10.4)
Chloride: 100 mmol/L (ref 98–110)
Creat: 0.74 mg/dL (ref 0.60–0.93)
GFR, Est African American: 91 mL/min/{1.73_m2} (ref >=60)
GFR, Est Non African American: 78 mL/min/{1.73_m2} (ref >=60)
Globulin: 3.4 g/dL (calc) (ref 1.9–3.7)
Glucose, Bld: 101 mg/dL — ABNORMAL HIGH (ref 65–99)
Potassium: 4.3 mmol/L (ref 3.5–5.3)
Sodium: 132 mmol/L — ABNORMAL LOW (ref 135–146)
Total Bilirubin: 0.4 mg/dL (ref 0.2–1.2)
Total Protein: 7.7 g/dL (ref 6.1–8.1)

## 2019-02-24 LAB — URINALYSIS W MICROSCOPIC + REFLEX CULTURE
Bacteria, UA: NONE SEEN /HPF
Bilirubin Urine: NEGATIVE
Glucose, UA: NEGATIVE
Hgb urine dipstick: NEGATIVE
Hyaline Cast: NONE SEEN /LPF
Ketones, ur: NEGATIVE
Nitrites, Initial: NEGATIVE
Protein, ur: NEGATIVE
RBC / HPF: NONE SEEN /HPF (ref 0–2)
Specific Gravity, Urine: 1.011 (ref 1.001–1.03)
pH: 6 (ref 5.0–8.0)

## 2019-02-24 LAB — URINE CULTURE
MICRO NUMBER:: 734496
Result:: NO GROWTH
SPECIMEN QUALITY:: ADEQUATE

## 2019-02-24 LAB — LIPASE: Lipase: 43 U/L (ref 7–60)

## 2019-02-24 LAB — CULTURE INDICATED

## 2019-02-24 LAB — AMYLASE: Amylase: 50 U/L (ref 21–101)

## 2019-03-04 DIAGNOSIS — M25511 Pain in right shoulder: Secondary | ICD-10-CM | POA: Diagnosis not present

## 2019-03-15 ENCOUNTER — Ambulatory Visit
Admission: RE | Admit: 2019-03-15 | Discharge: 2019-03-15 | Disposition: A | Discharge status: Home / Self Care | Payer: PPO | Source: Ambulatory Visit | Attending: Adult Health | Admitting: Adult Health

## 2019-03-15 ENCOUNTER — Other Ambulatory Visit: Payer: Self-pay | Admitting: Adult Health

## 2019-03-15 DIAGNOSIS — K769 Liver disease, unspecified: Secondary | ICD-10-CM

## 2019-03-15 DIAGNOSIS — K7469 Other cirrhosis of liver: Secondary | ICD-10-CM

## 2019-03-15 DIAGNOSIS — K7689 Other specified diseases of liver: Secondary | ICD-10-CM | POA: Diagnosis not present

## 2019-03-15 DIAGNOSIS — R935 Abnormal findings on diagnostic imaging of other abdominal regions, including retroperitoneum: Secondary | ICD-10-CM

## 2019-03-15 DIAGNOSIS — K760 Fatty (change of) liver, not elsewhere classified: Secondary | ICD-10-CM

## 2019-03-15 DIAGNOSIS — R1011 Right upper quadrant pain: Secondary | ICD-10-CM

## 2019-03-16 ENCOUNTER — Other Ambulatory Visit: Payer: Self-pay | Admitting: Oncology

## 2019-03-16 DIAGNOSIS — R160 Hepatomegaly, not elsewhere classified: Secondary | ICD-10-CM

## 2019-03-16 NOTE — Progress Notes (Unsigned)
I called the patient and told her that I was setting her up for lab work biopsy of the liver and a visit here.  She tells me she is not going to have a biopsy of the liver because it was very painful when her brother and sister-in-law had not and because they died.  I was not able to convince her that we need that to know what is going on.  I am getting a series of tumor markers which may begin to give [place] the answer.  She will see me again on 9/03.  To discuss further

## 2019-03-17 ENCOUNTER — Telehealth: Payer: Self-pay | Admitting: Oncology

## 2019-03-17 NOTE — Telephone Encounter (Signed)
Scheduled appt per 8/25 sch message - unable to reach pt  .left message with appt date and time

## 2019-03-18 ENCOUNTER — Inpatient Hospital Stay: Payer: PPO | Attending: Oncology

## 2019-03-18 ENCOUNTER — Other Ambulatory Visit: Payer: Self-pay

## 2019-03-18 DIAGNOSIS — E119 Type 2 diabetes mellitus without complications: Secondary | ICD-10-CM | POA: Insufficient documentation

## 2019-03-18 DIAGNOSIS — R160 Hepatomegaly, not elsewhere classified: Secondary | ICD-10-CM | POA: Diagnosis not present

## 2019-03-18 DIAGNOSIS — D582 Other hemoglobinopathies: Secondary | ICD-10-CM | POA: Insufficient documentation

## 2019-03-18 DIAGNOSIS — C50312 Malignant neoplasm of lower-inner quadrant of left female breast: Secondary | ICD-10-CM | POA: Diagnosis not present

## 2019-03-18 DIAGNOSIS — M810 Age-related osteoporosis without current pathological fracture: Secondary | ICD-10-CM | POA: Insufficient documentation

## 2019-03-18 LAB — CBC WITH DIFFERENTIAL/PLATELET
Abs Immature Granulocytes: 0.07 10*3/uL (ref 0.00–0.07)
Basophils Absolute: 0.1 10*3/uL (ref 0.0–0.1)
Basophils Relative: 0 %
Eosinophils Absolute: 0.4 10*3/uL (ref 0.0–0.5)
Eosinophils Relative: 3 %
HCT: 37.3 % (ref 36.0–46.0)
Hemoglobin: 12.4 g/dL (ref 12.0–15.0)
Immature Granulocytes: 1 %
Lymphocytes Relative: 28 %
Lymphs Abs: 3.9 10*3/uL (ref 0.7–4.0)
MCH: 30.6 pg (ref 26.0–34.0)
MCHC: 33.2 g/dL (ref 30.0–36.0)
MCV: 92.1 fL (ref 80.0–100.0)
Monocytes Absolute: 1.1 10*3/uL — ABNORMAL HIGH (ref 0.1–1.0)
Monocytes Relative: 8 %
Neutro Abs: 8.3 10*3/uL — ABNORMAL HIGH (ref 1.7–7.7)
Neutrophils Relative %: 60 %
Platelets: 401 10*3/uL — ABNORMAL HIGH (ref 150–400)
RBC: 4.05 MIL/uL (ref 3.87–5.11)
RDW: 13.6 % (ref 11.5–15.5)
WBC: 13.9 10*3/uL — ABNORMAL HIGH (ref 4.0–10.5)
nRBC: 0 % (ref 0.0–0.2)

## 2019-03-18 LAB — COMPREHENSIVE METABOLIC PANEL
ALT: 15 U/L (ref 0–44)
AST: 17 U/L (ref 15–41)
Albumin: 3.8 g/dL (ref 3.5–5.0)
Alkaline Phosphatase: 95 U/L (ref 38–126)
Anion gap: 8 (ref 5–15)
BUN: 13 mg/dL (ref 8–23)
CO2: 23 mmol/L (ref 22–32)
Calcium: 10.3 mg/dL (ref 8.9–10.3)
Chloride: 103 mmol/L (ref 98–111)
Creatinine, Ser: 0.81 mg/dL (ref 0.44–1.00)
GFR calc Af Amer: >60 mL/min (ref >60)
GFR calc non Af Amer: >60 mL/min (ref >60)
Glucose, Bld: 105 mg/dL — ABNORMAL HIGH (ref 70–99)
Potassium: 4.4 mmol/L (ref 3.5–5.1)
Sodium: 134 mmol/L — ABNORMAL LOW (ref 135–145)
Total Bilirubin: 0.4 mg/dL (ref 0.3–1.2)
Total Protein: 7.9 g/dL (ref 6.5–8.1)

## 2019-03-19 LAB — CEA (IN HOUSE-CHCC): CEA (CHCC-In House): 34.54 ng/mL — ABNORMAL HIGH (ref 0.00–5.00)

## 2019-03-19 LAB — CANCER ANTIGEN 27.29: CA 27.29: 33.5 U/mL (ref 0.0–38.6)

## 2019-03-19 LAB — AFP TUMOR MARKER: AFP, Serum, Tumor Marker: 6.6 ng/mL (ref 0.0–8.3)

## 2019-03-19 LAB — CA 125: Cancer Antigen (CA) 125: 25 U/mL (ref 0.0–38.1)

## 2019-03-23 DIAGNOSIS — R52 Pain, unspecified: Secondary | ICD-10-CM

## 2019-03-23 HISTORY — DX: Pain, unspecified: R52

## 2019-03-24 NOTE — Progress Notes (Signed)
[place]  [phone] Fax:([phone]) [phone]     ID: [name] DOB: Aug 04, 1940  MR#: [id number]  BOF#:[id number]  Patient Care Team: [name], MD as PCP - General (Internal Medicine) Inda Castle, MD (Inactive) as Consulting Physician (Gastroenterology) Deneise Lever, MD as Consulting Physician (Pulmonary Disease) Marica Otter, [place] (Optometry) [name], MD as Consulting Physician (General Surgery) Quintell Bonnin, Virgie Dad, MD as Consulting Physician (Oncology) Eppie Gibson, MD as Attending Physician (Radiation Oncology) Mauro Kaufmann, RN as Registered Nurse Rockwell Germany, RN as Registered Nurse Jake Shark, Johny Blamer, NP as Nurse Practitioner (Nurse Practitioner) Ninetta Lights, MD as Consulting Physician (Orthopedic Surgery) OTHER MD:   CHIEF COMPLAINT: 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 She was last seen in 11/2016 (she cancelled her 11/2017 appointment).  She stopped her letrozole at her own discretion in September 2019.  She continues on alendronate with good tolerance.  Since her last visit, she underwent left diagnostic mammogram and left breast ultrasound at [facility] on 10/31/2017, which showed: breast density category C; no concerning masses, calcifications, or non-surgical distortion identified; at least 2-3 thickened lymph nodes within the left axilla, partially visualized.  Biopsy of one of the lymph nodes in question was performed on 11/06/2017. Pathology from the procedure [id number]) was benign.  She also underwent bone density screening on 04/06/2018. This showed a T-score of -3.5, which is worse as compared to -3.0 in 02/2016.  On 02/22/2019, she presented to her PCP with right upper quadrant/right lower chest pain. She underwent  chest x-ray the following day, which showed no active cardiopulmonary disease.  Her pain continued, so she proceeded to abdomen ultrasound on 03/15/2019. This revealed: numerous solid hypoechoic and bull's-eye lesions throughout the liver concerning for metastatic disease.  I called the patient on 03/16/2019 regarding the liver lesions and recommended liver biopsy prior to today's visit. The patient however refused.  She agreed to labs for tumor markers.  She is here today together with her son [name] to discuss this further  Tumor markers from labs drawn on 03/18/2019: Lab Results  Component Value Date   CAN125 25.0 03/18/2019   Lab Results  Component Value Date   CA2729 33.5 03/18/2019   Lab Results  Component Value Date   CEA1 34.54 (H) 03/18/2019    Lab Results  Component Value Date   AFPTUMOR 6.6 03/18/2019    REVIEW OF SYSTEMS: [name] continues to have pain particularly in the right upper quadrant of the abdomen and in the right shoulder.  She saw 1 of Dr. Viona Gilmore Murphy's physicians assistants and was told that the right upper extremity has significant bursitis as she understood it and did receive a shot which helped for a couple of days but the right shoulder continues to be very painful.  The pain in the right upper abdomen is soft lower, more diffuse, but not so far associated with significant loss of appetite altered taste nausea or vomiting.  She denies any change in bowel or bladder habits.  There have been no fever or bleeding.  A detailed review of systems today was otherwise stable   BREAST CANCER HISTORY: From the original intake note:  "[name]" has a history of right breast cancer, status post right modified radical mastectomy in 1990. The patient tells me the tumor was "pretty day", and that they  took 13 lymph nodes out. She does not know if the cancer was in the lymph nodes. She received no adjuvant therapy. She also has a history of uterine cancer in her 53s.  More recently,  she had routine screening mammography September 2015 which showed a possible mass in the left breast. Diagnostic left mammography and left ultrasonography 04/11/2014 at the left breast showed an area of focal asymmetry measuring 8 mm in the left breast which was not palpable and not seen by ultrasonography.  Six-month follow-up was recommended and this was performed 10/24/2014, this time with tomography. The breast density was category C. There was an irregular subcentimeter mass in the left breast 8:00 position with coarse calcifications. This was not palpable, but this time ultrasound showed a hypoechoic left breast mass at the area in question, measuring 0.5 cm.  Biopsy of the left breast mass 10/24/2014 showed ([id number]) an invasive ductal carcinoma, grade 1 or 2, estrogen receptor 100% positive, progesterone receptor 75% positive, both with strong staining intensity, with an MIB-1 of 11% and no HER-2 amplification, the signals ratio being 1.09 and the number per cell 1.80.  The patient's subsequent history is as detailed below   PAST MEDICAL HISTORY: Past Medical History:  Diagnosis Date   Allergic rhinitis    ASCVD (arteriosclerotic cardiovascular disease)    Atherosclerosis of aorta ([place]) 2014   via CT AB/Pelvis- no AAA   COPD (chronic obstructive pulmonary disease) (HCC)    DJD (degenerative joint disease)    neck, lower back, feet, hands   Elevated hemoglobin A1c measurement    Tobacco abuse     PAST SURGICAL HISTORY: Past Surgical History:  Procedure Laterality Date   ABDOMINAL HYSTERECTOMY  1976   BACK SURGERY  2005   lower back   bladder tact  1985   BREAST LUMPECTOMY Left 2016   BREAST LUMPECTOMY WITH RADIOACTIVE SEED LOCALIZATION Left 11/30/2014   Procedure: LEFT BREAST LUMPECTOMY WITH RADIOACTIVE SEED LOCALIZATION;  Surgeon: [name], MD;  Location: [place];  Service: General;  Laterality: Left;   [place]   rightmastectomy-axillary nodes     CHOLECYSTECTOMY  2006   CHOLECYSTECTOMY N/A 2008   Dr. Rosana Hoes   excision of benign salivary tumor Left 1968   EYE SURGERY     bilateral cataract eye surgery - now low vision   LAPAROSCOPIC BILATERAL SALPINGO OOPHERECTOMY Bilateral 11/30/2013   Procedure: LAPAROSCOPIC RIGHT SALPINGO OOPHORECTOMY with extensive lysis of adhesions;  Surgeon: Allena Katz, MD;  Location: [facility];  Service: Gynecology;  Laterality: Bilateral;   MASTECTOMY  1990   Right, no adjuvant   MULTIPLE TOOTH EXTRACTIONS     SHOULDER SURGERY Left 2007   Dr. Percell Miller    FAMILY HISTORY Family History  Problem Relation Age of Onset   Heart disease Mother    Cancer Mother        breast   Emphysema Brother    Emphysema Brother    Emphysema Sister    Breast cancer Sister    Allergies Brother        all brothers and sisters   Asthma Sister    Breast cancer Sister    Asthma Sister    Heart disease Sister    Heart disease Brother    Cancer Sister        breast   Colon cancer Neg Hx    the patient's father died from bladder cancer the age of 32. The patient's mother died from heart  disease at the age of 78. The patient had 5 brothers and 7 sisters. The patient's mother and 3 of her sisters had breast cancer. There is no history of ovarian cancer in the family   GYNECOLOGIC HISTORY:  No LMP recorded. Patient is postmenopausal. Menarche age 82, first live birth age 69. She is GX P2. She did not recall exactly when she went through menopause, but she underwent hysterectomy and unilateral salpingo-oophorectomy remotely. She never took hormone replacement. She took oral contraceptives approximately for 3 years with no complications   SOCIAL HISTORY:  [name] is a homemaker. She is very limited in what she can do at present.  Their daughter Butch Penny lives in [place] and is a health caregiver. Son [name] lives in [place] and works in home improvement. The patient has 5 grandchildren  and 6 great-grandchildren. She is a Psychologist, forensic    ADVANCED DIRECTIVES: Not in place   HEALTH MAINTENANCE: Social History   Tobacco Use   Smoking status: Current Every Day Smoker    Packs/day: 0.75    Years: 55.00    Pack years: 41.25    Types: Cigarettes, E-cigarettes   Smokeless tobacco: Never Used   Tobacco comment: Electronic cig as well @ 0.'6mg'$  of e cig only  Substance Use Topics   Alcohol use: No   Drug use: No     Colonoscopy: November 2015  PAP: Status post hysterectomy  Bone density: 06/26/2011, normal  Lipid panel:  Allergies  Allergen Reactions   Bisoprolol Other (See Comments)    Decreased Heart Rate   Ace Inhibitors Cough   Acyclovir And Related Rash and Other (See Comments)    Okay to take famvir   Amoxil [Amoxicillin] Diarrhea   Ciprofloxacin Diarrhea   Crestor [Rosuvastatin] Other (See Comments)    Elevated LFT's and Myalgias   Doxycycline Diarrhea   Enalapril Cough   Lipitor [Atorvastatin] Other (See Comments)    Elevated LFT's and Myalgias   Metformin And Related Diarrhea   Prednisone Other (See Comments)    Intolerant to High Dose Prednisone   Vicodin [Hydrocodone-Acetaminophen] Other (See Comments)    Increased Anxiety and Nervousness    Current Outpatient Medications  Medication Sig Dispense Refill   albuterol (PROVENTIL HFA;VENTOLIN HFA) 108 (90 BASE) MCG/ACT inhaler Inhale 2 puffs into the lungs every 6 (six) hours as needed for wheezing or shortness of breath.     alendronate (FOSAMAX) 70 MG tablet Take 1 tablet (70 mg total) by mouth every 7 (seven) days. Take with a full glass of water on an empty stomach. (Patient not taking: Reported on 02/22/2019) 12 tablet 3   aspirin 81 MG tablet Take 81 mg by mouth daily.       busPIRone (BUSPAR) 5 MG tablet Take 1 tablet (5 mg total) by mouth 3 (three) times daily. For anxiety. 90 tablet 0   Cholecalciferol (VITAMIN D3) 2000 UNITS TABS Take 4,000 Units by mouth daily.       dexamethasone (DECADRON) 1 MG tablet Take 1 tablet 3 x /day for 3 days - 2 x /day for 3 days - then 1 x /day 20 tablet 0   diclofenac sodium (VOLTAREN) 1 % GEL Apply 4 g topically 4 (four) times daily. (Patient taking differently: Apply 4 g topically as needed. ) 100 g 3   fenofibrate micronized (LOFIBRA) 134 MG capsule Take 1 capsule by mouth once daily 90 capsule 3   fish oil-omega-3 fatty acids 1000 MG capsule Take 1 capsule by mouth  3 (three) times daily.      Flaxseed, Linseed, (FLAX SEED OIL) 1000 MG CAPS Take 1,000 mg by mouth 3 (three) times daily.      fluticasone (FLONASE) 50 MCG/ACT nasal spray Place 2 sprays into both nostrils daily. (Patient taking differently: Place 2 sprays into both nostrils as needed. ) 16 g 1   fluticasone furoate-vilanterol (BREO ELLIPTA) 100-25 MCG/INH AEPB Inhale 1 puff into the lungs daily. Rinse mouth with water after each use (Patient taking differently: Inhale 1 puff into the lungs as needed. Rinse mouth with water after each use) 1 each 0   Magnesium 500 MG TABS Take 500 mg by mouth 2 (two) times daily.      meloxicam (MOBIC) 15 MG tablet Take one daily with food for 2 weeks, can take with tylenol, can not take with aleve, iburpofen, then as needed daily for pain (Patient not taking: Reported on 02/22/2019) 30 tablet 1   metFORMIN (GLUCOPHAGE-XR) 500 MG 24 hr tablet Take 2 tablets 2 x /day with meals for Diabetes 360 tablet 1   sucralfate (CARAFATE) 1 g tablet Take 1 tablet (1 g total) by mouth 4 (four) times daily. Take with glass of water, or dissolve in 4 oz of water. Take before each meal and prior to bedtime. 120 tablet 1   telmisartan (MICARDIS) 80 MG tablet Take 1 tablet by mouth once daily for blood pressure 90 tablet 3   No current facility-administered medications for this visit.     OBJECTIVE: Older white woman examined in a wheelchair Vitals:   03/25/19 1514  BP: 124/74  Pulse: 97  Resp: 18  Temp: 98.7 F (37.1 C)  SpO2: 99%      Body mass index is 26.05 kg/m.    ECOG FS:1 - Symptomatic but completely ambulatory  Sclerae unicteric, EOMs intact Wearing a mask No cervical or supraclavicular adenopathy Lungs no rales or rhonchi Heart regular rate and rhythm Abd soft, mild discomfort in the right upper quadrant area to moderate palpation, positive bowel sounds MSK no focal spinal tenderness, no upper extremity lymphedema Neuro: nonfocal, well oriented, appropriate affect Breasts: Status post right mastectomy, with no evidence of chest wall recurrence.  The left breast is benign.  Both axilla were benign.   LAB RESULTS:   CMP     Component Value Date/Time   NA 134 (L) 03/18/2019 1521   NA 139 12/04/2016 1220   K 4.4 03/18/2019 1521   K 4.1 12/04/2016 1220   CL 103 03/18/2019 1521   CO2 23 03/18/2019 1521   CO2 21 (L) 12/04/2016 1220   GLUCOSE 105 (H) 03/18/2019 1521   GLUCOSE 196 (H) 12/04/2016 1220   BUN 13 03/18/2019 1521   BUN 15.8 12/04/2016 1220   CREATININE 0.81 03/18/2019 1521   CREATININE 0.74 02/22/2019 1629   CREATININE 1.0 12/04/2016 1220   CALCIUM 10.3 03/18/2019 1521   CALCIUM 10.0 12/04/2016 1220   PROT 7.9 03/18/2019 1521   PROT 7.5 12/04/2016 1220   ALBUMIN 3.8 03/18/2019 1521   ALBUMIN 3.8 12/04/2016 1220   AST 17 03/18/2019 1521   AST 37 (H) 12/04/2016 1220   ALT 15 03/18/2019 1521   ALT 41 12/04/2016 1220   ALKPHOS 95 03/18/2019 1521   ALKPHOS 95 12/04/2016 1220   BILITOT 0.4 03/18/2019 1521   BILITOT 0.36 12/04/2016 1220   GFRNONAA >60 03/18/2019 1521   GFRNONAA 78 02/22/2019 1629   GFRAA >60 03/18/2019 1521   GFRAA 91 02/22/2019 1629  INo results found for: SPEP, UPEP  Lab Results  Component Value Date   WBC 13.9 (H) 03/18/2019   NEUTROABS 8.3 (H) 03/18/2019   HGB 12.4 03/18/2019   HCT 37.3 03/18/2019   MCV 92.1 03/18/2019   PLT 401 (H) 03/18/2019      Chemistry      Component Value Date/Time   NA 134 (L) 03/18/2019 1521   NA 139 12/04/2016 1220   K 4.4  03/18/2019 1521   K 4.1 12/04/2016 1220   CL 103 03/18/2019 1521   CO2 23 03/18/2019 1521   CO2 21 (L) 12/04/2016 1220   BUN 13 03/18/2019 1521   BUN 15.8 12/04/2016 1220   CREATININE 0.81 03/18/2019 1521   CREATININE 0.74 02/22/2019 1629   CREATININE 1.0 12/04/2016 1220      Component Value Date/Time   CALCIUM 10.3 03/18/2019 1521   CALCIUM 10.0 12/04/2016 1220   ALKPHOS 95 03/18/2019 1521   ALKPHOS 95 12/04/2016 1220   AST 17 03/18/2019 1521   AST 37 (H) 12/04/2016 1220   ALT 15 03/18/2019 1521   ALT 41 12/04/2016 1220   BILITOT 0.4 03/18/2019 1521   BILITOT 0.36 12/04/2016 1220       No results found for: LABCA2  No components found for: LABCA125  No results for input(s): INR in the last 168 hours.  Urinalysis    Component Value Date/Time   COLORURINE YELLOW 02/22/2019 1629   APPEARANCEUR CLEAR 02/22/2019 1629   LABSPEC 1.011 02/22/2019 1629   PHURINE 6.0 02/22/2019 1629   GLUCOSEU NEGATIVE 02/22/2019 1629   HGBUR NEGATIVE 02/22/2019 1629   [place] 08/22/2016 1514   KETONESUR NEGATIVE 02/22/2019 1629   PROTEINUR NEGATIVE 02/22/2019 1629   UROBILINOGEN 0.2 07/05/2013 1658   NITRITE NEGATIVE 09/23/2017 1458   LEUKOCYTESUR 2+ (A) 09/23/2017 1458    STUDIES: US Abdomen Complete  Result Date: 03/15/2019 CLINICAL DATA:  Generalized abdominal tenderness, right upper quadrant pain EXAM: ABDOMEN ULTRASOUND COMPLETE COMPARISON:  10/27/2015 FINDINGS: Gallbladder: Prior cholecystectomy Common bile duct: Diameter: Normal caliber, 6 mm Liver: Heterogeneous echotexture throughout the liver. There are numerous solid-appearing hypoechoic and bull's-eye lesions throughout the liver, the largest is in the right hepatic lobe measuring 4.2 cm. Appearance is concerning for metastases. Portal vein is patent on color Doppler imaging with normal direction of blood flow towards the liver. IVC: No abnormality visualized. Pancreas: Visualized portion unremarkable. Spleen: Size  and appearance within normal limits. Right Kidney: Length: 10.6 cm. Echogenicity within normal limits. No mass or hydronephrosis visualized. Left Kidney: Length: 11.3 cm. Normal echotexture. No hydronephrosis. Exophytic cyst off the midpole measures up to 1.5 cm. Abdominal aorta: No aneurysm visualized. Other findings: None. IMPRESSION: Numerous solid hypoechoic in bull's-eye lesions throughout the liver concerning for metastatic disease. Electronically Signed   By: Rolm Baptise M.D.   On: 03/15/2019 16:11     ASSESSMENT: 78 y.o. [place] woman status post left breast biopsy 10/24/2014 for a clinical T1 N0 invasive ductal carcinoma, grade 1 or 2, estrogen and progesterone receptor positive, HER-2 negative, with an MIB-1 of 11%  (1) status post left lumpectomy 11/30/2014 for a pT1b pNX, stage IA invasive ductal carcinoma, grade 1, again HER-2 negative, with negative margins  (2) anastrozole started 12/30/2014-- discontinued 03/31/2015 because of side effects  (a) exemestane prescribed 03/31/2015-- not started because of cost issues  (b) letrozole started November 2016, discontinued per pt SEPT 2019  (c) bone density December 2012 was normal  (d) [facility] showed a T score of -3.0.  (e) alendronate started 02/27/2016  (f) bone density 0 04/06/2018 shows a T score of -3.5  (3) even if the patient proves unable to tolerate aromatase inhibitors, she still refuses radiation  (4) status post right modified radical mastectomy in 1994 for what was at least a stage II breast cancer, with no adjuvant therapy given  (5) status post hysterectomy with unilateral salpingo-oophorectomy for endometrial cancer in the patient's 62s, no further data available  (6) multiple liver lesions noted on abdominal ultrasound 03/15/2019  (a) CEA elevated; AFP, Ca1 25 and CA-27-29 normal  PLAN: I spent approximately 60 minutes face to face with [place] and her son with more than 50% of  that time spent in counseling and coordination of care. We reviewed the fact that cancer is not one disease but more than 100 different diseases and that it is important to keep them separate-- otherwise when friends and relatives discuss their own cancer experiences with Ladonya confusion can result. Similarly we explained that if for example breast cancer spreads to the bone or liver, the patient would not have bone cancer or liver cancer, but breast cancer in the bone and breast cancer in the liver: one cancer in three places-- not 3 different cancers which otherwise would have to be treated in 3 different ways.  We then discussed the findings from the recent abdominal ultrasound, which was obtained to evaluate her right upper quadrant pain.  It shows many liver lesions which do not appear to be cancer of the liver but rather cancer that has gone to the liver from another part of the body.  This impression is strengthened by the fact that the tumor marker for liver cancer, alpha-fetoprotein, is normal.  On the other hand the CEA is elevated.  CEA elevation is associated with adenocarcinomas.  We discussed the fact that adenocarcinomas are cancers that start with glands and include breast, lung, colon, stomach, esophagus, pancreas, kidney, thyroid, and many other types.  If this is an adenocarcinoma that has spread to the liver, then we are dealing with stage IV disease.  Unfortunately we are generally not able to cure stage IV adenocarcinoma as regardless of their origin.  We do have however many treatments.  The treatments are different for the different types of adenocarcinomas  Accordingly I again recommended a liver biopsy.  If this is breast cancer for example or colon cancer we have treatments that while not curative are generally well-tolerated and can greatly extend the lives of some patients with good quality.  In the absence of pathologic confirmation of what we are dealing with I cannot offer her  any treatment except supportive care.  Today despite my and her son's urging Andrian declined to undergo liver biopsy.  I suggested that she could go through a colonoscopy and that that might give [place] the diagnosis, but she refused that as well.  Accordingly we moved to supportive care.  I suggested that she start taking Aleve 1 tablet and Tylenol 500 mg together 3 times a day for pain.  If that is not adequate I have called in a prescription for tramadol that she may use at 1 to 2 tablets up to 4 times daily.  She understands this may constipate her and if so she needs to start MiraLAX and stool softeners.  The patient does not have advanced directives.  Today I gave her a form so she can name a healthcare power of attorney and  also consider a living will.  Tentatively I am putting her in for a visit in about a month.  If she decides against any treatment she will be referred to hospice.  If she does decide to go for the biopsy we should have all the results by the time of the next visit so we can discuss treatment    Chauncey Cruel, MD   03/25/2019 5:13 PM Medical Oncology and Hematology [address] [place], [place] [address] Tel. [phone]    Fax. [phone]   I, Wilburn Mylar, am acting as scribe for Dr. Virgie Dad. Zema Lizardo.  I, Lurline Del MD, have reviewed the above documentation for accuracy and completeness, and I agree with the above.

## 2019-03-25 ENCOUNTER — Other Ambulatory Visit: Payer: Self-pay

## 2019-03-25 ENCOUNTER — Inpatient Hospital Stay: Payer: PPO | Attending: Oncology | Admitting: Oncology

## 2019-03-25 VITALS — BP 124/74 | HR 97 | Temp 98.7°F | Resp 18 | Wt 148.2 lb

## 2019-03-25 DIAGNOSIS — I700 Atherosclerosis of aorta: Secondary | ICD-10-CM | POA: Diagnosis not present

## 2019-03-25 DIAGNOSIS — C50312 Malignant neoplasm of lower-inner quadrant of left female breast: Secondary | ICD-10-CM

## 2019-03-25 DIAGNOSIS — M818 Other osteoporosis without current pathological fracture: Secondary | ICD-10-CM | POA: Diagnosis not present

## 2019-03-25 DIAGNOSIS — K7469 Other cirrhosis of liver: Secondary | ICD-10-CM

## 2019-03-25 DIAGNOSIS — Z170 Estrogen receptor positive status [ER+]: Secondary | ICD-10-CM

## 2019-03-25 DIAGNOSIS — H548 Legal blindness, as defined in USA: Secondary | ICD-10-CM

## 2019-03-25 DIAGNOSIS — R160 Hepatomegaly, not elsewhere classified: Secondary | ICD-10-CM | POA: Diagnosis not present

## 2019-03-25 MED ORDER — TRAMADOL HCL 50 MG PO TABS: 50.0000 mg | ORAL_TABLET | Freq: Four times a day (QID) | ORAL | 2 refills | Status: DC | PRN

## 2019-03-26 ENCOUNTER — Telehealth: Payer: Self-pay | Admitting: Oncology

## 2019-03-26 NOTE — Telephone Encounter (Signed)
I left a message regarding schedule I will mail °

## 2019-04-01 ENCOUNTER — Other Ambulatory Visit: Payer: Self-pay | Admitting: *Deleted

## 2019-04-04 ENCOUNTER — Other Ambulatory Visit: Payer: Self-pay | Admitting: Internal Medicine

## 2019-04-07 ENCOUNTER — Other Ambulatory Visit: Payer: Self-pay | Admitting: Radiology

## 2019-04-08 ENCOUNTER — Other Ambulatory Visit: Payer: Self-pay

## 2019-04-08 ENCOUNTER — Encounter: Payer: Self-pay | Admitting: Internal Medicine

## 2019-04-08 ENCOUNTER — Encounter (HOSPITAL_COMMUNITY): Payer: Self-pay

## 2019-04-08 ENCOUNTER — Ambulatory Visit (HOSPITAL_COMMUNITY)
Admission: RE | Admit: 2019-04-08 | Discharge: 2019-04-08 | Disposition: A | Discharge status: Home / Self Care | Payer: PPO | Source: Ambulatory Visit | Attending: Oncology | Admitting: Oncology

## 2019-04-08 DIAGNOSIS — Z885 Allergy status to narcotic agent status: Secondary | ICD-10-CM | POA: Insufficient documentation

## 2019-04-08 DIAGNOSIS — Z888 Allergy status to other drugs, medicaments and biological substances status: Secondary | ICD-10-CM | POA: Diagnosis not present

## 2019-04-08 DIAGNOSIS — Z170 Estrogen receptor positive status [ER+]: Secondary | ICD-10-CM | POA: Insufficient documentation

## 2019-04-08 DIAGNOSIS — Z880 Allergy status to penicillin: Secondary | ICD-10-CM | POA: Diagnosis not present

## 2019-04-08 DIAGNOSIS — K769 Liver disease, unspecified: Secondary | ICD-10-CM | POA: Diagnosis present

## 2019-04-08 DIAGNOSIS — Z0184 Encounter for antibody response examination: Secondary | ICD-10-CM | POA: Diagnosis not present

## 2019-04-08 DIAGNOSIS — Z9011 Acquired absence of right breast and nipple: Secondary | ICD-10-CM | POA: Insufficient documentation

## 2019-04-08 DIAGNOSIS — J449 Chronic obstructive pulmonary disease, unspecified: Secondary | ICD-10-CM | POA: Diagnosis not present

## 2019-04-08 DIAGNOSIS — C787 Secondary malignant neoplasm of liver and intrahepatic bile duct: Secondary | ICD-10-CM | POA: Insufficient documentation

## 2019-04-08 DIAGNOSIS — Z7984 Long term (current) use of oral hypoglycemic drugs: Secondary | ICD-10-CM | POA: Diagnosis not present

## 2019-04-08 DIAGNOSIS — I2510 Atherosclerotic heart disease of native coronary artery without angina pectoris: Secondary | ICD-10-CM | POA: Diagnosis not present

## 2019-04-08 DIAGNOSIS — R160 Hepatomegaly, not elsewhere classified: Secondary | ICD-10-CM

## 2019-04-08 DIAGNOSIS — C229 Malignant neoplasm of liver, not specified as primary or secondary: Secondary | ICD-10-CM | POA: Diagnosis not present

## 2019-04-08 DIAGNOSIS — F17210 Nicotine dependence, cigarettes, uncomplicated: Secondary | ICD-10-CM | POA: Diagnosis not present

## 2019-04-08 DIAGNOSIS — C50919 Malignant neoplasm of unspecified site of unspecified female breast: Secondary | ICD-10-CM | POA: Diagnosis not present

## 2019-04-08 DIAGNOSIS — F17290 Nicotine dependence, other tobacco product, uncomplicated: Secondary | ICD-10-CM | POA: Insufficient documentation

## 2019-04-08 DIAGNOSIS — Z7982 Long term (current) use of aspirin: Secondary | ICD-10-CM | POA: Diagnosis not present

## 2019-04-08 HISTORY — DX: Dyspnea, unspecified: R06.00

## 2019-04-08 LAB — COMPREHENSIVE METABOLIC PANEL
ALT: 21 U/L (ref 0–44)
AST: 22 U/L (ref 15–41)
Albumin: 3.8 g/dL (ref 3.5–5.0)
Alkaline Phosphatase: 77 U/L (ref 38–126)
Anion gap: 12 (ref 5–15)
BUN: 20 mg/dL (ref 8–23)
CO2: 21 mmol/L — ABNORMAL LOW (ref 22–32)
Calcium: 10.2 mg/dL (ref 8.9–10.3)
Chloride: 103 mmol/L (ref 98–111)
Creatinine, Ser: 0.82 mg/dL (ref 0.44–1.00)
GFR calc Af Amer: >60 mL/min (ref >60)
GFR calc non Af Amer: >60 mL/min (ref >60)
Glucose, Bld: 105 mg/dL — ABNORMAL HIGH (ref 70–99)
Potassium: 4.1 mmol/L (ref 3.5–5.1)
Sodium: 136 mmol/L (ref 135–145)
Total Bilirubin: 0.3 mg/dL (ref 0.3–1.2)
Total Protein: 8 g/dL (ref 6.5–8.1)

## 2019-04-08 LAB — CBC WITH DIFFERENTIAL/PLATELET
Abs Immature Granulocytes: 0.06 10*3/uL (ref 0.00–0.07)
Basophils Absolute: 0.1 10*3/uL (ref 0.0–0.1)
Basophils Relative: 1 %
Eosinophils Absolute: 0.2 10*3/uL (ref 0.0–0.5)
Eosinophils Relative: 2 %
HCT: 37.4 % (ref 36.0–46.0)
Hemoglobin: 12.3 g/dL (ref 12.0–15.0)
Immature Granulocytes: 1 %
Lymphocytes Relative: 29 %
Lymphs Abs: 3.3 10*3/uL (ref 0.7–4.0)
MCH: 31 pg (ref 26.0–34.0)
MCHC: 32.9 g/dL (ref 30.0–36.0)
MCV: 94.2 fL (ref 80.0–100.0)
Monocytes Absolute: 1.1 10*3/uL — ABNORMAL HIGH (ref 0.1–1.0)
Monocytes Relative: 9 %
Neutro Abs: 6.9 10*3/uL (ref 1.7–7.7)
Neutrophils Relative %: 58 %
Platelets: 431 10*3/uL — ABNORMAL HIGH (ref 150–400)
RBC: 3.97 MIL/uL (ref 3.87–5.11)
RDW: 13.5 % (ref 11.5–15.5)
WBC: 11.6 10*3/uL — ABNORMAL HIGH (ref 4.0–10.5)
nRBC: 0 % (ref 0.0–0.2)

## 2019-04-08 LAB — PROTIME-INR
INR: 1.1 (ref 0.8–1.2)
Prothrombin Time: 13.7 seconds (ref 11.4–15.2)

## 2019-04-08 LAB — GLUCOSE, CAPILLARY: Glucose-Capillary: 100 mg/dL — ABNORMAL HIGH (ref 70–99)

## 2019-04-08 MED ORDER — MIDAZOLAM HCL 2 MG/2ML IJ SOLN
INTRAMUSCULAR | Status: AC | PRN
  Administered 2019-04-08 (×2): 1 mg via INTRAVENOUS

## 2019-04-08 MED ORDER — FENTANYL CITRATE (PF) 100 MCG/2ML IJ SOLN
INTRAMUSCULAR | Status: AC | PRN
  Administered 2019-04-08 (×2): 50 ug via INTRAVENOUS

## 2019-04-08 MED ORDER — GELATIN ABSORBABLE 12-7 MM EX MISC
CUTANEOUS | Status: AC
  Filled 2019-04-08: qty 1

## 2019-04-08 MED ORDER — MIDAZOLAM HCL 2 MG/2ML IJ SOLN
INTRAMUSCULAR | Status: AC
  Filled 2019-04-08: qty 2

## 2019-04-08 MED ORDER — FENTANYL CITRATE (PF) 100 MCG/2ML IJ SOLN
INTRAMUSCULAR | Status: AC
  Filled 2019-04-08: qty 2

## 2019-04-08 MED ORDER — SODIUM CHLORIDE 0.9 % IV SOLN
INTRAVENOUS | Status: DC
  Administered 2019-04-08: 12:00:00 via INTRAVENOUS

## 2019-04-08 MED ORDER — LIDOCAINE HCL 1 % IJ SOLN
INTRAMUSCULAR | Status: AC
  Filled 2019-04-08: qty 20

## 2019-04-08 NOTE — Procedures (Signed)
LIVER METS  S/P US LIVER LESION BX  NO COMP STABLE EBL MIN PATH PENDING FULL REPORT IN PACS

## 2019-04-08 NOTE — H&P (Signed)
Chief Complaint: Patient was seen in consultation today for liver lesion/biopsy.  Referring Physician(s): Chauncey Cruel  Supervising Physician: Daryll Brod  Patient Status: [facility] - Out-pt  History of Present Illness: [name] is a 78 y.o. female with a past medical history of CAD, COPD, ER+ breast cancer, DJD, vision loss/blindness, and tobacco abuse. She was unfortunately diagnosed with breast cancer in 1990. Her cancer is managed by Dr. [name]. She underwent a right modified radial mastectomy in 1990. She had a left lumpectomy in 11/2014. She is currently undergoing immunomodulator therapy for management. On routine imaging follow-up, she was found to have numerous liver lesions concerning for metastatic disease.  US abdomen complete 03/15/2019: 1. Numerous solid hypoechoic in bull's-eye lesions throughout the liver concerning for metastatic disease.  IR consulted by Dr. [name] for possible image-guided liver lesion biopsy. Patient awake and alert laying in bed. Complains of dyspnea. States that she has COPD and this is normal for her. Complains of intermittent RUQ abdominal pain that radiates to right chest, stable at this time. Denies fever, chills, chest pain, or headache.   Past Medical History:  Diagnosis Date  . Allergic rhinitis   . ASCVD (arteriosclerotic cardiovascular disease)   . Atherosclerosis of aorta ([place]) 2014   via CT AB/Pelvis- no AAA  . COPD (chronic obstructive pulmonary disease) ([place])   . DJD (degenerative joint disease)    neck, lower back, feet, hands  . Dyspnea   . Elevated hemoglobin A1c measurement   . Pain 03/2019   right chest/ breast area, worse with inspiration  . Tobacco abuse     Past Surgical History:  Procedure Laterality Date  . ABDOMINAL HYSTERECTOMY  1976  . BACK SURGERY  2005   lower back  . bladder tact  1985  . BREAST LUMPECTOMY Left 2016  . BREAST LUMPECTOMY WITH RADIOACTIVE SEED LOCALIZATION Left 11/30/2014   Procedure: LEFT BREAST LUMPECTOMY WITH RADIOACTIVE SEED LOCALIZATION;  Surgeon: [name], MD;  Location: [facility];  Service: General;  Laterality: Left;  . BREAST SURGERY  1990   rightmastectomy-axillary nodes  . CHOLECYSTECTOMY  2006  . CHOLECYSTECTOMY N/A 2008   Dr. Rosana Hoes  . excision of benign salivary tumor Left 1968  . EYE SURGERY     bilateral cataract eye surgery - now low vision  . LAPAROSCOPIC BILATERAL SALPINGO OOPHERECTOMY Bilateral 11/30/2013   Procedure: LAPAROSCOPIC RIGHT SALPINGO OOPHORECTOMY with extensive lysis of adhesions;  Surgeon: Allena Katz, MD;  Location: [facility];  Service: Gynecology;  Laterality: Bilateral;  . MASTECTOMY  1990   Right, no adjuvant  . MULTIPLE TOOTH EXTRACTIONS    . SHOULDER SURGERY Left 2007   Dr. Percell Miller    Allergies: Bisoprolol, Ace inhibitors, Acyclovir and related, Amoxil [amoxicillin], Ciprofloxacin, Crestor [rosuvastatin], Doxycycline, Enalapril, Lipitor [atorvastatin], Metformin and related, Prednisone, and Vicodin [hydrocodone-acetaminophen]  Medications: Prior to Admission medications   Medication Sig Start Date End Date Taking? Authorizing Provider  albuterol (PROVENTIL HFA;VENTOLIN HFA) 108 (90 BASE) MCG/ACT inhaler Inhale 2 puffs into the lungs every 6 (six) hours as needed for wheezing or shortness of breath.    [provider]  aspirin 81 MG tablet Take 81 mg by mouth daily.      [provider]  busPIRone (BUSPAR) 5 MG tablet Take 1 tablet (5 mg total) by mouth 3 (three) times daily. For anxiety. 02/22/19   Liane Comber, NP  Cholecalciferol (VITAMIN D3) 2000 UNITS TABS Take 4,000 Units by mouth daily.     [provider]  dexamethasone (DECADRON) 1 MG tablet Take 1 tablet 3 x /day for 3 days - 2 x /day for 3 days - then 1 x /day 04/21/18   [name], MD  diclofenac sodium (VOLTAREN) 1 % GEL Apply 4 g topically 4 (four) times daily. Patient taking differently: Apply 4 g topically as needed.   06/24/17   Liane Comber, NP  fenofibrate micronized (LOFIBRA) 134 MG capsule Take 1 capsule by mouth once daily 10/11/18   [name], MD  fish oil-omega-3 fatty acids 1000 MG capsule Take 1 capsule by mouth 3 (three) times daily.     [provider]  Flaxseed, Linseed, (FLAX SEED OIL) 1000 MG CAPS Take 1,000 mg by mouth 3 (three) times daily.     [provider]  fluticasone (FLONASE) 50 MCG/ACT nasal spray Place 2 sprays into both nostrils daily. Patient taking differently: Place 2 sprays into both nostrils as needed.  06/24/17   Liane Comber, NP  fluticasone furoate-vilanterol (BREO ELLIPTA) 100-25 MCG/INH AEPB Inhale 1 puff into the lungs daily. Rinse mouth with water after each use Patient taking differently: Inhale 1 puff into the lungs as needed. Rinse mouth with water after each use 07/16/18   Vicie Mutters, PA-C  losartan (COZAAR) 100 MG tablet Take 1 tablet Daily for BP 04/04/19   [name], MD  Magnesium 500 MG TABS Take 500 mg by mouth 2 (two) times daily.     [provider]  meloxicam (MOBIC) 15 MG tablet Take one daily with food for 2 weeks, can take with tylenol, can not take with aleve, iburpofen, then as needed daily for pain Patient not taking: Reported on 02/22/2019 12/02/17   Liane Comber, NP  metFORMIN (GLUCOPHAGE-XR) 500 MG 24 hr tablet Take 2 tablets 2 x /day with meals for Diabetes 06/22/18   [name], MD  sucralfate (CARAFATE) 1 g tablet Take 1 tablet (1 g total) by mouth 4 (four) times daily. Take with glass of water, or dissolve in 4 oz of water. Take before each meal and prior to bedtime. 02/22/19 02/22/20  Liane Comber, NP  telmisartan (MICARDIS) 80 MG tablet Take 1 tablet by mouth once daily for blood pressure 10/11/18   [name], MD  traMADol (ULTRAM) 50 MG tablet Take 1-2 tablets (50-100 mg total) by mouth every 6 (six) hours as needed. 03/25/19   Magrinat, Virgie Dad, MD     Family History  Problem Relation Age  of Onset  . Heart disease Mother   . Cancer Mother        breast  . Emphysema Brother   . Emphysema Brother   . Emphysema Sister   . Breast cancer Sister   . Allergies Brother        all brothers and sisters  . Asthma Sister   . Breast cancer Sister   . Asthma Sister   . Heart disease Sister   . Heart disease Brother   . Cancer Sister        breast  . Colon cancer Neg Hx     Social History   Socioeconomic History  . Marital status: Married    Spouse name: Not on file  . Number of children: 2  . Years of education: Not on file  . Highest education level: Not on file  Occupational History  . Occupation: retired  Scientific laboratory technician  . Financial resource strain: Not on file  . Food insecurity    Worry: Not on file  Inability: Not on file  . Transportation needs    Medical: Not on file    Non-medical: Not on file  Tobacco Use  . Smoking status: Current Every Day Smoker    Packs/day: 0.75    Years: 55.00    Pack years: 41.25    Types: Cigarettes, E-cigarettes  . Smokeless tobacco: Never Used  . Tobacco comment: Electronic cig as well @ 0.6mg  of e cig only  Substance and Sexual Activity  . Alcohol use: No  . Drug use: No  . Sexual activity: Yes    Birth control/protection: Post-menopausal  Lifestyle  . Physical activity    Days per week: Not on file    Minutes per session: Not on file  . Stress: Not on file  Relationships  . Social Herbalist on phone: Not on file    Gets together: Not on file    Attends religious service: Not on file    Active member of club or organization: Not on file    Attends meetings of clubs or organizations: Not on file    Relationship status: Not on file  Other Topics Concern  . Not on file  Social History Narrative   Married with children.   Smokes 1/2 ppd currently.  Started at age 62   No etoh   Housewife   Quit vaping products Mar 2020     Review of Systems: A 12 point ROS discussed and pertinent positives are  indicated in the HPI above.  All other systems are negative.  Review of Systems  Constitutional: Negative for chills and fever.  Respiratory: Positive for shortness of breath. Negative for wheezing.   Cardiovascular: Negative for chest pain and palpitations.  Gastrointestinal: Positive for abdominal pain.  Neurological: Negative for headaches.  Psychiatric/Behavioral: Negative for behavioral problems and confusion.    Vital Signs: BP 115/74 (BP Location: Left Arm)   Pulse 68   Temp 98.4 F (36.9 C) (Oral)   Resp (!) 23   SpO2 100%   Physical Exam Vitals signs and nursing note reviewed.  Constitutional:      General: She is not in acute distress.    Appearance: Normal appearance.  Cardiovascular:     Rate and Rhythm: Normal rate and regular rhythm.     Heart sounds: Normal heart sounds. No murmur.  Pulmonary:     Effort: Pulmonary effort is normal. No respiratory distress.     Breath sounds: Normal breath sounds. No wheezing.  Abdominal:     Palpations: Abdomen is soft.     Tenderness: There is no abdominal tenderness.  Neurological:     Mental Status: She is alert and oriented to person, place, and time.  Psychiatric:        Mood and Affect: Mood normal.        Behavior: Behavior normal.        Thought Content: Thought content normal.        Judgment: Judgment normal.      MD Evaluation Airway: WNL Heart: WNL Abdomen: WNL Chest/ Lungs: WNL ASA  Classification: 3 Mallampati/Airway Score: Two   Imaging: US Abdomen Complete  Result Date: 03/15/2019 CLINICAL DATA:  Generalized abdominal tenderness, right upper quadrant pain EXAM: ABDOMEN ULTRASOUND COMPLETE COMPARISON:  10/27/2015 FINDINGS: Gallbladder: Prior cholecystectomy Common bile duct: Diameter: Normal caliber, 6 mm Liver: Heterogeneous echotexture throughout the liver. There are numerous solid-appearing hypoechoic and bull's-eye lesions throughout the liver, the largest is in the right hepatic lobe  measuring  4.2 cm. Appearance is concerning for metastases. Portal vein is patent on color Doppler imaging with normal direction of blood flow towards the liver. IVC: No abnormality visualized. Pancreas: Visualized portion unremarkable. Spleen: Size and appearance within normal limits. Right Kidney: Length: 10.6 cm. Echogenicity within normal limits. No mass or hydronephrosis visualized. Left Kidney: Length: 11.3 cm. Normal echotexture. No hydronephrosis. Exophytic cyst off the midpole measures up to 1.5 cm. Abdominal aorta: No aneurysm visualized. Other findings: None. IMPRESSION: Numerous solid hypoechoic in bull's-eye lesions throughout the liver concerning for metastatic disease. Electronically Signed   By: Rolm Baptise M.D.   On: 03/15/2019 16:11    Labs:  CBC: Recent Labs    07/16/18 1613 02/22/19 1629 03/18/19 1521 04/08/19 1155  WBC 11.3* 10.7 13.9* 11.6*  HGB 12.5 13.3 12.4 12.3  HCT 36.7 38.8 37.3 37.4  PLT 388 390 401* 431*    COAGS: Recent Labs    04/08/19 1155  INR 1.1    BMP: Recent Labs    07/16/18 1613 02/22/19 1629 03/18/19 1521 04/08/19 1155  NA 137 132* 134* 136  K 4.7 4.3 4.4 4.1  CL 104 100 103 103  CO2 25 22 23  21*  GLUCOSE 90 101* 105* 105*  BUN 20 13 13 20   CALCIUM 10.8* 10.5* 10.3 10.2  CREATININE 0.83 0.74 0.81 0.82  GFRNONAA 68 78 >60 >60  GFRAA 79 91 >60 >60    LIVER FUNCTION TESTS: Recent Labs    07/16/18 1613 02/22/19 1629 03/18/19 1521 04/08/19 1155  BILITOT 0.4 0.4 0.4 0.3  AST 18 17 17 22   ALT 18 16 15 21   ALKPHOS  --   --  95 77  PROT 7.5 7.7 7.9 8.0  ALBUMIN  --   --  3.8 3.8     Assessment and Plan:  Liver lesion. Plan for image-guided liver lesion biopsy today with Dr. Annamaria Boots. Patient is NPO. Afebrile. She does not take blood thinners. INR 1.1 today.  Risks and benefits discussed with the patient including, but not limited to bleeding, infection, damage to adjacent structures or low yield requiring additional  tests. All of the patient's questions were answered, patient is agreeable to proceed. Consent signed and in chart.   Thank you for this interesting consult.  I greatly enjoyed meeting LINCY BELLES and look forward to participating in their care.  A copy of this report was sent to the requesting provider on this date.  Electronically Signed: Earley Abide, PA-C 04/08/2019, 1:45 PM   I spent a total of 40 Minutes in face to face in clinical consultation, greater than 50% of which was counseling/coordinating care for liver lesion/biopsy.

## 2019-04-08 NOTE — Discharge Instructions (Signed)
Moderate Conscious Sedation, Adult, Care After These instructions provide you with information about caring for yourself after your procedure. Your health care provider may also give you more specific instructions. Your treatment has been planned according to current medical practices, but problems sometimes occur. Call your health care provider if you have any problems or questions after your procedure. What can I expect after the procedure? After your procedure, it is common:  To feel sleepy for several hours.  To feel clumsy and have poor balance for several hours.  To have poor judgment for several hours.  To vomit if you eat too soon. Follow these instructions at home: For at least 24 hours after the procedure:  Do not: ? Participate in activities where you could fall or become injured. ? Drive. ? Use heavy machinery. ? Drink alcohol. ? Take sleeping pills or medicines that cause drowsiness. ? Make important decisions or sign legal documents. ? Take care of children on your own.  Rest. Eating and drinking  Follow the diet recommended by your health care provider.  If you vomit: ? Drink water, juice, or soup when you can drink without vomiting. ? Make sure you have little or no nausea before eating solid foods. General instructions  Have a responsible adult stay with you until you are awake and alert.  Take over-the-counter and prescription medicines only as told by your health care provider.  If you smoke, do not smoke without supervision.  Keep all follow-up visits as told by your health care provider. This is important. Contact a health care provider if:  You keep feeling nauseous or you keep vomiting.  You feel light-headed.  You develop a rash.  You have a fever. Get help right away if:  You have trouble breathing. This information is not intended to replace advice given to you by your health care provider. Make sure you discuss any questions you have  with your health care provider. Document Released: 04/28/2013 Document Revised: 06/20/2017 Document Reviewed: 10/28/2015 Elsevier Patient Education  2020 [employer].   Liver Biopsy, Care After These instructions give you information on caring for yourself after your procedure. Your doctor may also give you more specific instructions. Call your doctor if you have any problems or questions after your procedure. What can I expect after the procedure? After the procedure, it is common to have:  Pain and soreness where the biopsy was done.  Bruising around the area where the biopsy was done.  Sleepiness and be tired for a few days. Follow these instructions at home: Medicines  Take over-the-counter and prescription medicines only as told by your doctor.  If you were prescribed an antibiotic medicine, take it as told by your doctor. Do not stop taking the antibiotic even if you start to feel better.  Do not take medicines such as aspirin and ibuprofen. These medicines can thin your blood. Do not take these medicines unless your doctor tells you to take them.  If you are taking prescription pain medicine, take actions to prevent or treat constipation. Your doctor may recommend that you: ? Drink enough fluid to keep your pee (urine) clear or pale yellow. ? Take over-the-counter or prescription medicines. ? Eat foods that are high in fiber, such as fresh fruits and vegetables, whole grains, and beans. ? Limit foods that are high in fat and processed sugars, such as fried and sweet foods. Caring for your cut  Follow instructions from your doctor about how to take care of your  cuts from surgery (incisions). Make sure you: ? Wash your hands with soap and water before you change your bandage (dressing). If you cannot use soap and water, use hand sanitizer. ? Change your bandage as told by your doctor. May remove bandaid in 24 hours and shower.  Keep site clean and dry.  Apply new bandaid as  needed. ? Leave stitches (sutures), skin glue, or skin tape (adhesive) strips in place. They may need to stay in place for 2 weeks or longer. If tape strips get loose and curl up, you may trim the loose edges. Do not remove tape strips completely unless your doctor says it is okay.  Check your cuts every day for signs of infection. Check for: ? Redness, swelling, or more pain. ? Fluid or blood. ? Pus or a bad smell. ? Warmth.  Do not take baths, swim, or use a hot tub until your doctor says it is okay to do so. When wound is closed, may submerge. Activity  Rest at home for 1-2 days or as told by your doctor. ? Avoid sitting for a long time without moving. Get up to take short walks every 1-2 hours.  Return to your normal activities as told by your doctor. Ask what activities are safe for you.  Do not do these things in the first 24 hours: ? Drive. ? Use machinery. ? Take a bath or shower.  Do not lift more than 10 pounds (4.5 kg) or play contact sports for the first 2 weeks. General instructions  Do not drink alcohol in the first week after the procedure.  Have someone stay with you for at least 24 hours after the procedure.  Get your test results. Ask your doctor or the department that is doing the test: ? When will my results be ready? ? How will I get my results? ? What are my treatment options? ? What other tests do I need? ? What are my next steps?  Keep all follow-up visits as told by your doctor. This is important. Contact a doctor if:  A cut bleeds and leaves more than just a small spot of blood.  A cut is red, puffs up (swells), or hurts more than before.  Fluid or something else comes from a cut.  A cut smells bad.  You have a fever or chills. Get help right away if:  You have swelling, bloating, or pain in your belly (abdomen).  You get dizzy or faint.  You have a rash.  You feel sick to your stomach (nauseous) or throw up (vomit).  You have  trouble breathing, feel short of breath, or feel faint.  Your chest hurts.  You have problems talking or seeing.  You have trouble with your balance or moving your arms or legs. Summary  After the procedure, it is common to have pain, soreness, bruising, and tiredness.  Your doctor will tell you how to take care of yourself at home. Change your bandage, take your medicines, and limit your activities as told by your doctor.  Call your doctor if you have symptoms of infection. Get help right away if your belly swells, your cut bleeds a lot, or you have trouble talking or breathing.  This information is not intended to replace advice given to you by your health care provider. Make sure you discuss any questions you have with your health care provider. Document Released: 04/16/2008 Document Revised: 07/18/2017 Document Reviewed: 07/18/2017 Elsevier Patient Education  2020 [employer].  Emergency IR on call MD [phone]

## 2019-04-09 DIAGNOSIS — R238 Other skin changes: Secondary | ICD-10-CM | POA: Diagnosis not present

## 2019-04-12 ENCOUNTER — Other Ambulatory Visit: Payer: Self-pay | Admitting: Oncology

## 2019-04-12 LAB — SURGICAL PATHOLOGY

## 2019-04-12 NOTE — Progress Notes (Unsigned)
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  We do not have an obvious primary.  She knows this disease is aggressive, hard to treat, and not curable.  We do not have a CT of the chest but her chest x-ray was unremarkable.  I suggested she could try chemotherapy.  She is having pain in her right shoulder and she says it radiates to her right breast.  I do not think this is likely to be related although it certainly could be.  I offered to get a CT of the chest to evaluate that further but at this point she would like to postpone that.  She is taking Tylenol and Aleve as suggested but she never did get the tramadol filled out and I suggest that she go ahead and try that.  They will return to see me October 1 and we will make a definitive decision at that point

## 2019-04-21 NOTE — Progress Notes (Signed)
[place]  Telephone:([phone]) [phone] Fax:(336) [phone]     ID: [name] DOB: [birth year]  MR#: [id number]  SRP#:[id number]  Patient Care Team: [name], MD as PCP - General (Internal Medicine) Inda Castle, MD (Inactive) as Consulting Physician (Gastroenterology) Deneise Lever, MD as Consulting Physician (Pulmonary Disease) Marica Otter, [place] (Optometry) [name], MD as Consulting Physician (General Surgery) , Virgie Dad, MD as Consulting Physician (Oncology) Eppie Gibson, MD as Attending Physician (Radiation Oncology) Mauro Kaufmann, RN as Registered Nurse Rockwell Germany, RN as Registered Nurse Jake Shark, Johny Blamer, NP as Nurse Practitioner (Nurse Practitioner) Ninetta Lights, MD as Consulting Physician (Orthopedic Surgery) OTHER MD:   CHIEF COMPLAINT: 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  Since her last visit, she underwent liver biopsy on 04/08/2019. Pathology from the procedure ([id number]) revealed small cell carcinoma, with a ki-67 of 80% and TTF-1 positivity.  This suggests a lung primary although recent chest x-ray did not show an obvious mass  Her most recent bone density screening on 04/06/2018 showed a T-score of -3.5, which is worse as compared to -3.0 in 02/2016.  She is scheduled for left diagnostic mammogram on 04/29/2019.  Tumor markers from labs drawn on 03/18/2019: Lab Results  Component Value Date   CAN125 25.0 03/18/2019   Lab Results  Component Value Date   CA2729 33.5 03/18/2019   Lab Results  Component Value Date   CEA1 34.54 (H) 03/18/2019    Lab Results  Component Value Date   AFPTUMOR 6.6 03/18/2019    REVIEW OF SYSTEMS: [name] continues to live by herself.  She has remarkably few symptoms related to her cancer and  specifically her appetite is good and she has had no taste alteration noted or nausea.  At home she uses an IT trainer wheelchair.  There have been no falls.  [name] is very attentive, lives nearby, and while they are not there 24/7 they are there every day for several hours.  What is bothering her is pain in the right shoulder which is worse in the morning, and pain in the upper anterior lateral chest wall, where she had her breast surgery.  There have been no intercurrent fevers, cough, phlegm production, or pleurisy.  A detailed review of systems was otherwise stable   BREAST CANCER HISTORY: From the original intake note:  "[name]" has a history of right breast cancer, status post right modified radical mastectomy in 1990. The patient tells me the tumor was "pretty day", and that they took 13 lymph nodes out. She does not know if the cancer was in the lymph nodes. She received no adjuvant therapy. She also has a history of uterine cancer in her 59s.  More recently, she had routine screening mammography September 2015 which showed a possible mass in the left breast. Diagnostic left mammography and left ultrasonography 04/11/2014 at the left breast showed an area of focal asymmetry measuring 8 mm in the left breast which was not palpable and not seen by ultrasonography.  Six-month follow-up was recommended and this was performed 10/24/2014, this time with tomography. The breast density was category C. There was an irregular subcentimeter mass in the left breast 8:00 position with coarse calcifications. This was not palpable, but this time ultrasound showed a hypoechoic left breast mass at the area in question, measuring 0.5 cm.  Biopsy  of the left breast mass 10/24/2014 showed ([id number]) an invasive ductal carcinoma, grade 1 or 2, estrogen receptor 100% positive, progesterone receptor 75% positive, both with strong staining intensity, with an MIB-1 of 11% and no HER-2 amplification, the signals ratio  being 1.09 and the number per cell 1.80.  The patient's subsequent history is as detailed below   PAST MEDICAL HISTORY: Past Medical History:  Diagnosis Date  . Allergic rhinitis   . ASCVD (arteriosclerotic cardiovascular disease)   . Atherosclerosis of aorta ([place]) 2014   via CT AB/Pelvis- no AAA  . COPD (chronic obstructive pulmonary disease) ([place])   . DJD (degenerative joint disease)    neck, lower back, feet, hands  . Dyspnea   . Elevated hemoglobin A1c measurement   . Pain 03/2019   right chest/ breast area, worse with inspiration  . Tobacco abuse     PAST SURGICAL HISTORY: Past Surgical History:  Procedure Laterality Date  . ABDOMINAL HYSTERECTOMY  1976  . BACK SURGERY  2005   lower back  . bladder tact  1985  . BREAST LUMPECTOMY Left 2016  . BREAST LUMPECTOMY WITH RADIOACTIVE SEED LOCALIZATION Left 11/30/2014   Procedure: LEFT BREAST LUMPECTOMY WITH RADIOACTIVE SEED LOCALIZATION;  Surgeon: [name], MD;  Location: [facility];  Service: General;  Laterality: Left;  . BREAST SURGERY  1990   rightmastectomy-axillary nodes  . CHOLECYSTECTOMY  2006  . CHOLECYSTECTOMY N/A 2008   Dr. Rosana Hoes  . excision of benign salivary tumor Left 1968  . EYE SURGERY     bilateral cataract eye surgery - now low vision  . LAPAROSCOPIC BILATERAL SALPINGO OOPHERECTOMY Bilateral 11/30/2013   Procedure: LAPAROSCOPIC RIGHT SALPINGO OOPHORECTOMY with extensive lysis of adhesions;  Surgeon: Allena Katz, MD;  Location: [facility];  Service: Gynecology;  Laterality: Bilateral;  . MASTECTOMY  1990   Right, no adjuvant  . MULTIPLE TOOTH EXTRACTIONS    . SHOULDER SURGERY Left 2007   Dr. Percell Miller    FAMILY HISTORY Family History  Problem Relation Age of Onset  . Heart disease Mother   . Cancer Mother        breast  . Emphysema Brother   . Emphysema Brother   . Emphysema Sister   . Breast cancer Sister   . Allergies Brother        all brothers and sisters  . Asthma Sister   . Breast cancer  Sister   . Asthma Sister   . Heart disease Sister   . Heart disease Brother   . Cancer Sister        breast  . Colon cancer Neg Hx    the patient's father died from bladder cancer the age of 79. The patient's mother died from heart disease at the age of 77. The patient had 5 brothers and 7 sisters. The patient's mother and 3 of her sisters had breast cancer. There is no history of ovarian cancer in the family   GYNECOLOGIC HISTORY:  No LMP recorded. Patient is postmenopausal. Menarche age 67, first live birth age 71. She is GX P2. She did not recall exactly when she went through menopause, but she underwent hysterectomy and unilateral salpingo-oophorectomy remotely. She never took hormone replacement. She took oral contraceptives approximately for 3 years with no complications   SOCIAL HISTORY:  [name] is a homemaker. She is very limited in what she can do at present.  Their daughter [name] lives in [place] and is a health caregiver. Son [place]  lives in [place] and works in home improvement. The patient has 5 grandchildren and 6 great-grandchildren. She is a Psychologist, forensic    ADVANCED DIRECTIVES: Extensively discussed during the 04/22/2019 visit.  The patient should not be resuscitated in case of a terminal event.  She should not undergo intubation or CPR.  Patient's son [name] was present during the discussion   HEALTH MAINTENANCE: Social History   Tobacco Use  . Smoking status: Current Every Day Smoker    Packs/day: 0.75    Years: 55.00    Pack years: 41.25    Types: Cigarettes, E-cigarettes  . Smokeless tobacco: Never Used  . Tobacco comment: Electronic cig as well @ 0.'6mg'$  of e cig only  Substance Use Topics  . Alcohol use: No  . Drug use: No     Colonoscopy: November 2015  PAP: Status post hysterectomy  Bone density: 06/26/2011, normal  Lipid panel:  Allergies  Allergen Reactions  . Bisoprolol Other (See Comments)    Decreased Heart Rate  . Ace Inhibitors  Cough  . Acyclovir And Related Rash and Other (See Comments)    Okay to take famvir  . Amoxil [Amoxicillin] Diarrhea  . Ciprofloxacin Diarrhea  . Crestor [Rosuvastatin] Other (See Comments)    Elevated LFT's and Myalgias  . Doxycycline Diarrhea  . Enalapril Cough  . Lipitor [Atorvastatin] Other (See Comments)    Elevated LFT's and Myalgias  . Metformin And Related Diarrhea  . Prednisone Other (See Comments)    Intolerant to High Dose Prednisone  . Vicodin [Hydrocodone-Acetaminophen] Other (See Comments)    Increased Anxiety and Nervousness    Current Outpatient Medications  Medication Sig Dispense Refill  . acetaminophen (TYLENOL) 500 MG tablet Take 500 mg by mouth every 6 (six) hours as needed (takes capsule).    Marland Kitchen albuterol (PROVENTIL HFA;VENTOLIN HFA) 108 (90 BASE) MCG/ACT inhaler Inhale 2 puffs into the lungs every 6 (six) hours as needed for wheezing or shortness of breath.    Marland Kitchen aspirin 81 MG tablet Take 81 mg by mouth daily.      . busPIRone (BUSPAR) 5 MG tablet Take 1 tablet (5 mg total) by mouth 3 (three) times daily. For anxiety. 90 tablet 0  . dexamethasone (DECADRON) 1 MG tablet Take 1 tablet 3 x /day for 3 days - 2 x /day for 3 days - then 1 x /day 20 tablet 0  . diclofenac sodium (VOLTAREN) 1 % GEL Apply 4 g topically 4 (four) times daily. (Patient taking differently: Apply 4 g topically as needed. ) 100 g 3  . fenofibrate micronized (LOFIBRA) 134 MG capsule Take 1 capsule by mouth once daily 90 capsule 3  . fish oil-omega-3 fatty acids 1000 MG capsule Take 1 capsule by mouth 3 (three) times daily.     . Flaxseed, Linseed, (FLAX SEED OIL) 1000 MG CAPS Take 1,000 mg by mouth 3 (three) times daily.     . fluticasone (FLONASE) 50 MCG/ACT nasal spray Place 2 sprays into both nostrils daily. (Patient taking differently: Place 2 sprays into both nostrils as needed. ) 16 g 1  . fluticasone furoate-vilanterol (BREO ELLIPTA) 100-25 MCG/INH AEPB Inhale 1 puff into the lungs daily.  Rinse mouth with water after each use (Patient taking differently: Inhale 1 puff into the lungs as needed. Rinse mouth with water after each use) 1 each 0  . losartan (COZAAR) 100 MG tablet Take 1 tablet Daily for BP 90 tablet 3  . Magnesium 500 MG TABS Take 500 mg by  mouth 2 (two) times daily.     . meloxicam (MOBIC) 15 MG tablet Take one daily with food for 2 weeks, can take with tylenol, can not take with aleve, iburpofen, then as needed daily for pain (Patient not taking: Reported on 02/22/2019) 30 tablet 1  . metFORMIN (GLUCOPHAGE-XR) 500 MG 24 hr tablet Take 2 tablets 2 x /day with meals for Diabetes 360 tablet 1  . naproxen sodium (ALEVE) 220 MG tablet Take 220 mg by mouth.    . sucralfate (CARAFATE) 1 g tablet Take 1 tablet (1 g total) by mouth 4 (four) times daily. Take with glass of water, or dissolve in 4 oz of water. Take before each meal and prior to bedtime. 120 tablet 1  . telmisartan (MICARDIS) 80 MG tablet Take 1 tablet by mouth once daily for blood pressure 90 tablet 3  . traMADol (ULTRAM) 50 MG tablet Take 1-2 tablets (50-100 mg total) by mouth every 6 (six) hours as needed. 60 tablet 2   No current facility-administered medications for this visit.     OBJECTIVE: Older white woman who looks stated age 89:   04/22/19 1353  BP: 129/68  Pulse: 98  Resp: 18  Temp: (!) 97.4 F (36.3 C)  SpO2: 95%     Body mass index is 25.66 kg/m.    ECOG FS:2 - Symptomatic, <50% confined to bed Filed Weights   04/22/19 1353  Weight: 146 lb (66.2 kg)    Sclerae unicteric, EOMs intact Wearing a mask There is a two thirds of a centimeter hard lymph node just inferior to the left ear Lungs no rales or rhonchi Heart regular rate and rhythm Abd soft, nontender, positive bowel sounds MSK no focal spinal tenderness, right upper extremity mobility limited because of pain, with no lymphedema or erythema Neuro: nonfocal, well oriented, appropriate affect Breasts: Status post right  mastectomy.  Careful palpation of the right chest wall just anterior to the axilla reveals an area of increased tenderness, without erythema or palpable mass.  There are no axillary masses   LAB RESULTS:   CMP     Component Value Date/Time   NA 136 04/08/2019 1155   NA 139 12/04/2016 1220   K 4.1 04/08/2019 1155   K 4.1 12/04/2016 1220   CL 103 04/08/2019 1155   CO2 21 (L) 04/08/2019 1155   CO2 21 (L) 12/04/2016 1220   GLUCOSE 105 (H) 04/08/2019 1155   GLUCOSE 196 (H) 12/04/2016 1220   BUN 20 04/08/2019 1155   BUN 15.8 12/04/2016 1220   CREATININE 0.82 04/08/2019 1155   CREATININE 0.74 02/22/2019 1629   CREATININE 1.0 12/04/2016 1220   CALCIUM 10.2 04/08/2019 1155   CALCIUM 10.0 12/04/2016 1220   PROT 8.0 04/08/2019 1155   PROT 7.5 12/04/2016 1220   ALBUMIN 3.8 04/08/2019 1155   ALBUMIN 3.8 12/04/2016 1220   AST 22 04/08/2019 1155   AST 37 (H) 12/04/2016 1220   ALT 21 04/08/2019 1155   ALT 41 12/04/2016 1220   ALKPHOS 77 04/08/2019 1155   ALKPHOS 95 12/04/2016 1220   BILITOT 0.3 04/08/2019 1155   BILITOT 0.36 12/04/2016 1220   GFRNONAA >60 04/08/2019 1155   GFRNONAA 78 02/22/2019 1629   GFRAA >60 04/08/2019 1155   GFRAA 91 02/22/2019 1629    INo results found for: SPEP, UPEP  Lab Results  Component Value Date   WBC 11.6 (H) 04/08/2019   NEUTROABS 6.9 04/08/2019   HGB 12.3 04/08/2019   HCT 37.4 04/08/2019  MCV 94.2 04/08/2019   PLT 431 (H) 04/08/2019      Chemistry      Component Value Date/Time   NA 136 04/08/2019 1155   NA 139 12/04/2016 1220   K 4.1 04/08/2019 1155   K 4.1 12/04/2016 1220   CL 103 04/08/2019 1155   CO2 21 (L) 04/08/2019 1155   CO2 21 (L) 12/04/2016 1220   BUN 20 04/08/2019 1155   BUN 15.8 12/04/2016 1220   CREATININE 0.82 04/08/2019 1155   CREATININE 0.74 02/22/2019 1629   CREATININE 1.0 12/04/2016 1220      Component Value Date/Time   CALCIUM 10.2 04/08/2019 1155   CALCIUM 10.0 12/04/2016 1220   ALKPHOS 77 04/08/2019 1155    ALKPHOS 95 12/04/2016 1220   AST 22 04/08/2019 1155   AST 37 (H) 12/04/2016 1220   ALT 21 04/08/2019 1155   ALT 41 12/04/2016 1220   BILITOT 0.3 04/08/2019 1155   BILITOT 0.36 12/04/2016 1220       No results found for: LABCA2  No components found for: LABCA125  No results for input(s): INR in the last 168 hours.  Urinalysis    Component Value Date/Time   COLORURINE YELLOW 02/22/2019 1629   APPEARANCEUR CLEAR 02/22/2019 1629   LABSPEC 1.011 02/22/2019 1629   PHURINE 6.0 02/22/2019 1629   GLUCOSEU NEGATIVE 02/22/2019 1629   HGBUR NEGATIVE 02/22/2019 1629   BILIRUBINUR NEGATIVE 08/22/2016 1514   KETONESUR NEGATIVE 02/22/2019 1629   PROTEINUR NEGATIVE 02/22/2019 1629   UROBILINOGEN 0.2 07/05/2013 1658   NITRITE NEGATIVE 09/23/2017 1458   LEUKOCYTESUR 2+ (A) 09/23/2017 1458    STUDIES: US Biopsy (liver)  Result Date: 04/08/2019 INDICATION: Prior history of breast cancer, liver metastases by ultrasound EXAM: ULTRASOUND GUIDED CORE BIOPSY OF LEFT LIVER METASTASIS MEDICATIONS: 1% LIDOCAINE LOCAL ANESTHESIA/SEDATION: Fentanyl 100 mcg IV; Versed 2.0 mg IV Moderate Sedation Time:  10 MINUTES The patient was continuously monitored during the procedure by the interventional radiology nurse under my direct supervision. PROCEDURE: The procedure, risks, benefits, and alternatives were explained to the patient. Questions regarding the procedure were encouraged and answered. The patient understands and consents to the procedure. The EPIGASTRIC REGION was prepped with CHLORAPREP in a sterile fashion, and a sterile drape was applied covering the operative field. A sterile gown and sterile gloves were used for the procedure. Local anesthesia was provided with 1% Lidocaine. Previous imaging reviewed. Preliminary ultrasound performed. A solid hypoechoic lesion was demonstrated in the left hepatic lobe through a subxiphoid window. Overlying skin marked. Under sterile conditions and local anesthesia,  a 17 gauge coaxial guide was advanced to the left hepatic lobe lesion. Needle position confirmed with ultrasound. Images obtained for documentation. 4 18 gauge core biopsies obtained. Samples placed in formalin. Needle tract occluded with Gel-Foam. Postprocedure imaging demonstrates no hemorrhage or hematoma. Patient tolerated biopsy well. COMPLICATIONS: None immediate. FINDINGS: Imaging confirms needle placed into the left hepatic lobe lesion for core biopsy IMPRESSION: Successful ultrasound left liver metastasis 18 gauge core biopsy Electronically Signed   By: Jerilynn Mages.  Shick M.D.   On: 04/08/2019 15:09     ASSESSMENT: 78 y.o. [place] woman status post left breast biopsy 10/24/2014 for a clinical T1 N0 invasive ductal carcinoma, grade 1 or 2, estrogen and progesterone receptor positive, HER-2 negative, with an MIB-1 of 11%  (1) status post left lumpectomy 11/30/2014 for a pT1b pNX, stage IA invasive ductal carcinoma, grade 1, again HER-2 negative, with negative margins  (2) anastrozole started 12/30/2014-- discontinued 03/31/2015 because  of side effects  (a) exemestane prescribed 03/31/2015-- not started because of cost issues  (b) letrozole started November 2016, discontinued per pt SEPT 2019  (c) bone density December 2012 was normal  (d) bone density 02/27/2016 at the [facility] showed a T score of -3.0.  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  (7) advanced directives: Patient does not desire chemotherapy or life prolonging treatment  (a) patient should not be resuscitated in case of a terminal event  (b) hospice referral placed 04/22/2019  Plan: I discussed the pathology report with the patient and her son [name].  They understand this is a small cell cancer.  The immunotherapy suggests a lung primary although chest x-ray did not show an obvious mass.  Recall the patient's husband died from non-small cell lung cancer less than 3 months ago.  I offered the patient chemotherapy and I explained that we could cut the doses and make the side effects bearable although they would still be significant.  She is very clear that she does not desire any chemotherapy or other life prolonging treatment simply for the sake of life prolongation.  She is very clear that she does not want CPR or intubation or resuscitation in case of a terminal event.  She is agreeable to a hospice referral.  She does have significant pain in the right shoulder area and the right chest wall area.  All of this could be from tumor infiltration and we are going to obtain a chest CT scan.  If there is measurable disease there we can consider palliative radiation.  If not we will obtain an MRI of her shoulder and consider referral to orthopedics.  Tentatively I am making her a return appointment here in a few weeks but they understand if she has declined or is otherwise not in need of see me we will simply postpone that visit  In the meantime she will continue to use the Aleve and Tylenol combination.  She tells me that she gets tinnitus and irritability from tramadol and that she was not able to tolerate oxycodone in the past.  Chauncey Cruel MD  Medical Oncology and Hematology [facility] [place], [place] [address] Tel. [phone]    Fax. [phone]   I, Wilburn Mylar,  am acting as scribe for Dr. Virgie Dad. .  I, Lurline Del MD, have reviewed the above documentation for accuracy and completeness, and I agree with the above.

## 2019-04-22 ENCOUNTER — Other Ambulatory Visit: Payer: Self-pay

## 2019-04-22 ENCOUNTER — Inpatient Hospital Stay: Payer: PPO | Attending: Oncology | Admitting: Oncology

## 2019-04-22 VITALS — BP 129/68 | HR 98 | Temp 97.4°F | Resp 18 | Ht 63.25 in | Wt 146.0 lb

## 2019-04-22 DIAGNOSIS — Z170 Estrogen receptor positive status [ER+]: Secondary | ICD-10-CM

## 2019-04-22 DIAGNOSIS — Z79811 Long term (current) use of aromatase inhibitors: Secondary | ICD-10-CM | POA: Insufficient documentation

## 2019-04-22 DIAGNOSIS — C787 Secondary malignant neoplasm of liver and intrahepatic bile duct: Secondary | ICD-10-CM | POA: Diagnosis not present

## 2019-04-22 DIAGNOSIS — C50312 Malignant neoplasm of lower-inner quadrant of left female breast: Secondary | ICD-10-CM | POA: Insufficient documentation

## 2019-04-22 DIAGNOSIS — M25511 Pain in right shoulder: Secondary | ICD-10-CM

## 2019-04-22 NOTE — Progress Notes (Signed)
MEDICARE ANNUAL WELLNESS VISIT AND FOLLOW UP  Assessment:    Annual medicare wellness exam, encounter for   Essential hypertension -well controlled today -cont meds -diet and exercise -recommended quitting smoking - TSH  ASCVD (arteriosclerotic cardiovascular disease) -followed by cards -cont daily asa -recommended quitting smoking  Aortic atherosclerosis ([place]) -cont asa -cont cholesterol meds -quit smoking  Seasonal and perennial allergic rhinitis -followed by pulmonology  COPD mixed type ([place]) -followed by pulmonology  Diverticulosis of colon without hemorrhage Monitor    Other cirrhosis of liver ([place]) -monitor LFTs -limit tylenol and alcohol  Type 2 diabetes mellitus with stage 3 chronic kidney disease, without long-term current use of insulin (H[facility]) -cont diet and exercise -cont meds - Hemoglobin A1c  Primary osteoarthritis involving multiple joints -exercise -limit tylenol for pain management due to liver    Hyperlipidemia -cont meds - Lipid panel  TOBACCO USER -not ready to quit smoking - scheduled for CT lung  Medication management - CBC with Differential/Platelet - CMP/GFR  Vitamin D deficiency -cont supplement  Cancer of lower-inner quadrant of left female breast ([place]) -followed by oncology  Blindness, 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 Primary not determined at this time; following with Dr. Benay Spice  Depression Start new medication as prescribed - lexapro 10 mg daily  Advised 8-12 week trial to evaluate efficacy Encouraged support group or counseling via cancer center Stress management techniques discussed, increase water, good sleep hygiene discussed, increase exercise, and increase veggies.  Call the office if any new AE's from  medications and we will switch them   Over 30 minutes of exam, counseling, chart review, and critical decision making was performed  Future Appointments  Date Time Provider [place]  04/29/2019  1:40 PM GI-BCG DIAG TOMO 1 GI-BCGMM GI-BREAST [facility]  05/05/2019  5:00 PM MC-MR 1 MC-MRI [facility]  05/31/2019  3:00 PM WL-CT 2 WL-CT [place]  06/03/2019 12:00 PM Causey, Charlestine Massed, NP [facility] None  05/08/2020  2:00 PM [name], MD [facility] None     Plan:   During the course of the visit the patient was educated and counseled about appropriate screening and preventive services including:    Pneumococcal vaccine   Influenza vaccine  Td vaccine  Prevnar 13  Screening electrocardiogram  Screening mammography  Bone densitometry screening  Colorectal cancer screening  Diabetes screening  Glaucoma screening  Nutrition counseling   Advanced directives: given info/requested copies   Subjective:   [name] is a 78 y.o. female who presents for Medicare Annual Wellness Visit and 3 month follow up on hypertension, prediabetes, hyperlipidemia, vitamin D def.   Patient can not read due to blindness, no longer seeing opth as nothing can be done. She does live by herself, son with her at night, granddaughter checks on her throughout the day. She has chronic widespread pain, electric wheelchair at home, walker in community. Uses commode to limit ambulation in home and reduce fall risk.   She was driven by her granddaughter who remains in the car due to covid 14.   Her husband passed away 2019/02/08 due to lung cancer; she has reported some depressed/anxious mood, tried buspar but didn't feel this was helpful, interested in alternative agent. Has never been on SSRI that she recalls.   She has hx of L Estrogen receptor  positive breast cancer (s/p right mastectomy, left lumpectomy) followed by Dr. [name]. She was on anastrozole but self tapered in 2019.   More  recently in 02/2019 she reported R lower chest/RUQ pain to me; CXR obtained due to smoker history was unremarkable; US abdomen showed numerous bulls eye lesions concerning for liver metastasis; she was evaluated by Dr. 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 CA 125 and CA 27.25 were WNL, CEA was 34.54. She has since scheduled for CT chest with contrast on 05/31/2019.   She has been referred to palliative care, they will be visiting at home tomorrow at 2:30pm.   Also scheduled for 05/05/2019 for R shoulder MRI due to reports of pain x 2 months by ortho. She reports R shoulder arthritis per Dr. Percell Miller, didn't improve with steroid injection, and has been recommended replacement which she has declined in consideration of her other medication. She reports 8/10 in shoulder, worse with any movement, takes ibuprofen/tylenol which does improve her pain to about 5/10.   BMI is Body mass index is 25.59 kg/m., she has been working on diet, exercise limited due to blindness and ortho pain, walks in her yard/house ~15 min daily Wt Readings from Last 3 Encounters:  04/26/19 145 lb 9.6 oz (66 kg)  04/22/19 146 lb (66.2 kg)  03/25/19 148 lb 3.2 oz (67.2 kg)   Her blood pressure has been controlled at home, today their BP is BP: 116/64 She does not workout. She denies chest pain, shortness of breath, dizziness.   She is on cholesterol medication (fenofibrate134 mg daily, off of atorvastatin due to myalgias) and denies myalgias. Her cholesterol is at goal. The cholesterol last visit was:   Lab Results  Component Value Date   CHOL 157 07/16/2018   HDL 39 (L) 07/16/2018   LDLCALC 81 07/16/2018   TRIG 311 (H) 07/16/2018   CHOLHDL 4.0 07/16/2018   She has been working on diet for  T2 diabetes on metformin 500 mg TID, and denies foot ulcerations, hyperglycemia, hypoglycemia , increased appetite, nausea, paresthesia of the feet, polydipsia, polyuria, vomiting and weight loss. She has glucometer at home but checks rarely, reports last was "100." Denies known low glucose. Last A1C in the office was:  Lab Results  Component Value Date   HGBA1C 6.2 (H) 07/16/2018   Last GFR Lab Results  Component Value Date   GFRNONAA >60 04/08/2019   Patient is not on Vitamin D supplement, felt this caused intolerable constipation Lab Results  Component Value Date   VD25OH 26 (L) 04/16/2018       Medication Review Current Outpatient Medications on File Prior to Visit  Medication Sig Dispense Refill  . acetaminophen (TYLENOL) 500 MG tablet Take 500 mg by mouth every 6 (six) hours as needed (takes capsule).    Marland Kitchen albuterol (PROVENTIL HFA;VENTOLIN HFA) 108 (90 BASE) MCG/ACT inhaler Inhale 2 puffs into the lungs every 6 (six) hours as needed for wheezing or shortness of breath.    Marland Kitchen aspirin 81 MG tablet Take 81 mg by mouth daily.      . fenofibrate micronized (LOFIBRA) 134 MG capsule Take 1 capsule by mouth once daily 90 capsule 3  . fish oil-omega-3 fatty acids 1000 MG capsule Take 1 capsule by mouth 3 (three) times daily.     . Flaxseed,  Linseed, (FLAX SEED OIL) 1000 MG CAPS Take 1,000 mg by mouth 3 (three) times daily.     . fluticasone (FLONASE) 50 MCG/ACT nasal spray Place 2 sprays into both nostrils daily. (Patient taking differently: Place 2 sprays into both nostrils as needed. ) 16 g 1  . fluticasone furoate-vilanterol (BREO ELLIPTA) 100-25 MCG/INH AEPB Inhale 1 puff into the lungs daily. Rinse mouth with water after each use (Patient taking differently: Inhale 1 puff into the lungs as needed. Rinse mouth with water after each use) 1 each 0  . losartan (COZAAR) 100 MG tablet Take 1 tablet Daily for BP 90 tablet 3  . Magnesium 500 MG TABS Take 500 mg by mouth 2 (two) times daily.      . metFORMIN (GLUCOPHAGE-XR) 500 MG 24 hr tablet Take 2 tablets 2 x /day with meals for Diabetes 360 tablet 1  . naproxen sodium (ALEVE) 220 MG tablet Take 220 mg by mouth.    . telmisartan (MICARDIS) 80 MG tablet Take 1 tablet by mouth once daily for blood pressure 90 tablet 3  . busPIRone (BUSPAR) 5 MG tablet Take 1 tablet (5 mg total) by mouth 3 (three) times daily. For anxiety. (Patient not taking: Reported on 04/26/2019) 90 tablet 0  . dexamethasone (DECADRON) 1 MG tablet Take 1 tablet 3 x /day for 3 days - 2 x /day for 3 days - then 1 x /day 20 tablet 0  . diclofenac sodium (VOLTAREN) 1 % GEL Apply 4 g topically 4 (four) times daily. (Patient not taking: Reported on 04/26/2019) 100 g 3  . meloxicam (MOBIC) 15 MG tablet Take one daily with food for 2 weeks, can take with tylenol, can not take with aleve, iburpofen, then as needed daily for pain (Patient not taking: Reported on 02/22/2019) 30 tablet 1  . sucralfate (CARAFATE) 1 g tablet Take 1 tablet (1 g total) by mouth 4 (four) times daily. Take with glass of water, or dissolve in 4 oz of water. Take before each meal and prior to bedtime. (Patient not taking: Reported on 04/26/2019) 120 tablet 1   No current facility-administered medications on file prior to visit.     Current Problems (verified) Patient Active Problem List   Diagnosis Date Noted  . Liver mass 03/16/2019  . COPD (chronic obstructive pulmonary disease) ([place]) 09/15/2018  . Osteoporosis 01/11/2018  . Aortic atherosclerosis ([place]) 10/31/2015  . 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 Malignant neoplasm of lower-inner quadrant of left breast in female, estrogen receptor positive ([place]) 10/26/2014  . Type 2 diabetes mellitus ([place]) 06/07/2014  . Medication management 11/23/2013  . Vitamin D deficiency 11/23/2013  . Hepatic cirrhosis ([place]) 10/01/2013  . Diverticulosis of colon without hemorrhage 08/24/2013  . DJD (degenerative joint disease)   . ASCVD (arteriosclerotic  cardiovascular disease)   . Seasonal and perennial allergic rhinitis 01/18/2010  . Mixed hyperlipidemia 01/12/2010  . TOBACCO USER 01/12/2010  . Essential hypertension 01/12/2010    Screening Tests Immunization History  Administered Date(s) Administered  . Hepatitis B, ped/adol 12/17/2013, 01/17/2014, 07/19/2014  . Influenza Split 05/17/2011, 04/21/2012  . Influenza Whole 04/23/2009, 04/13/2010  . Influenza, High Dose Seasonal PF 06/07/2014, 04/05/2015, 05/07/2016, 04/29/2017, 04/16/2018, 04/26/2019  . Influenza,inj,Quad PF,6+ Mos 04/06/2013  . PPD Test 06/07/2014  . Pneumococcal Conjugate-13 06/07/2014  . Pneumococcal Polysaccharide-23 05/17/2011  . Td 07/23/2007    Preventative care: Last colonoscopy: 2015 Last mammogram: 10/2017 - concerning lymph nodes neg on biopsy Last pap smear/pelvic exam: 10/25/15  DEXA:  03/2018 osteoporosis of forearm only, hips normal  CXR: 02/23/2019 normal  Chest CT; scheduled 05/31/2019 Abd [place]: 03/15/2019  - numerous solid-appearing hypoechoic and bull's-eye lesions throughout the liver, the largest is in the right hepatic lobe measuring 4.2 cm. Appearance is concerning for metastases.  Prior vaccinations: TD or Tdap: 2009 declines further Influenza: 2019 DUE given today  Pneumococcal: 2012 Prevnar13: 2015 Shingles/Zostavax: Declined  Names of Other Physician/Practitioners you currently use: 1. [facility]- here for primary care 2. Dr. Sabra Heck, eye doctor, last visit 2017, stopping because there is nothing that can be done 3. Dentures, dentist, last visit not seeing one  Patient Care Team: [name], MD as PCP - General (Internal Medicine) Inda Castle, MD (Inactive) as Consulting Physician (Gastroenterology) Deneise Lever, MD as Consulting Physician (Pulmonary Disease) Marica Otter, [place] (Optometry) [name], MD as Consulting Physician (General Surgery) Magrinat, Virgie Dad, MD as  Consulting Physician (Oncology) Eppie Gibson, MD as Attending Physician (Radiation Oncology) Mauro Kaufmann, RN as Registered Nurse Rockwell Germany, RN as Registered Nurse Jake Shark, Johny Blamer, NP as Nurse Practitioner (Nurse Practitioner) Ninetta Lights, MD as Consulting Physician (Orthopedic Surgery)  Allergies Allergies  Allergen Reactions  . Bisoprolol Other (See Comments)    Decreased Heart Rate  . Ace Inhibitors Cough  . Acyclovir And Related Rash and Other (See Comments)    Okay to take famvir  . Amoxil [Amoxicillin] Diarrhea  . Ciprofloxacin Diarrhea  . Crestor [Rosuvastatin] Other (See Comments)    Elevated LFT's and Myalgias  . Doxycycline Diarrhea  . Enalapril Cough  . Lipitor [Atorvastatin] Other (See Comments)    Elevated LFT's and Myalgias  . Metformin And Related Diarrhea  . Prednisone Other (See Comments)    Intolerant to High Dose Prednisone  . 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 FAMILY HISTORY Her family history includes Allergies in her brother; Asthma in her sister and sister; Breast cancer in her sister and sister; Cancer in her mother and sister; Emphysema in her brother, brother, and sister; Heart disease in her brother, mother, and sister. SOCIAL HISTORY She  reports that she has been smoking cigarettes. She has a 41.25 pack-year smoking history. She has never used  smokeless tobacco. She reports that she does not drink alcohol or use drugs.  MEDICARE WELLNESS OBJECTIVES: Physical activity:   Cardiac risk factors:   Depression/mood screen:   Depression screen [facility]  2/9 04/19/2018  Decreased Interest 0  Down, Depressed, Hopeless 0  PHQ - 2 Score 0    ADLs:  In your present state of health, do you have any difficulty performing the following activities: 04/08/2019  Hearing? N  Vision? Y  Difficulty concentrating or making decisions? N  Walking or climbing stairs? Y  Dressing or bathing? N  Some recent data might be hidden     Cognitive Testing  Alert? Yes  Normal Appearance?Yes  Oriented to person? Yes  Place? Yes   Time? Yes  Recall of three objects?  Yes  Can perform simple calculations? Yes  Displays appropriate judgment?Yes  Can read the correct time from a watch face? - n/a due to vision  EOL planning:     Objective:   Today's Vitals   04/26/19 1441  BP: 116/64  Pulse: 100  Temp: (!) 97 F (36.1 C)  SpO2: 96%  Weight: 145 lb 9.6 oz (66 kg)  Height: 5' 3.25" (1.607 m)  PainSc: 8   PainLoc: Arm   Body mass index is 25.59 kg/m.  General appearance: alert, no distress, WD/WN,  female HEENT: Pupils round and symmetrical, normocephalic, sclerae anicteric, TMs pearly, nares patent, no discharge or erythema, pharynx normal Oral cavity: MMM, no lesions Neck: supple, no lymphadenopathy, no thyromegaly, no masses Heart: RRR, normal S1, S2, no murmurs Lungs: CTA bilaterally, no wheezes, rhonchi, or rales Abdomen: +bs, soft, non tender, non distended, no masses, no hepatomegaly, no splenomegaly Musculoskeletal: Limited exam, nontender, no swelling, no obvious deformity Extremities: mild edema left leg compared to right, no cyanosis, no clubbing Pulses: 2+ symmetric, upper and lower extremities, normal cap refill Neurological: alert, oriented x 3, CN2-12 intact, strength normal upper extremities and lower extremities, sensation normal  throughout, DTRs 2+ throughout, no cerebellar signs, gait slow, antalgic gait with walker Psychiatric: depressed affect,Denies HI/SI, behavior normal, pleasant  Breast: defer Gyn: defer Rectal: defer   Medicare Attestation I have personally reviewed: The patient's medical and social history Their use of alcohol, tobacco or illicit drugs Their current medications and supplements The patient's functional ability including ADLs,fall risks, home safety risks, cognitive, and hearing and visual impairment Diet and physical activities Evidence for depression or mood disorders  The patient's weight, height, BMI, and visual acuity have been recorded in the chart.  I have made referrals, counseling, and provided education to the patient based on review of the above and I have provided the patient with a written personalized care plan for preventive services.     Izora Ribas, NP   04/26/2019

## 2019-04-23 ENCOUNTER — Telehealth: Payer: Self-pay | Admitting: Oncology

## 2019-04-23 NOTE — Telephone Encounter (Signed)
I talk with patient regarding schedule  

## 2019-04-26 ENCOUNTER — Other Ambulatory Visit: Payer: Self-pay

## 2019-04-26 ENCOUNTER — Encounter: Payer: Self-pay | Admitting: Adult Health

## 2019-04-26 ENCOUNTER — Telehealth: Payer: Self-pay | Admitting: *Deleted

## 2019-04-26 ENCOUNTER — Other Ambulatory Visit: Payer: Self-pay | Admitting: *Deleted

## 2019-04-26 ENCOUNTER — Ambulatory Visit (INDEPENDENT_AMBULATORY_CARE_PROVIDER_SITE_OTHER): Payer: PPO | Admitting: Adult Health

## 2019-04-26 VITALS — BP 116/64 | HR 100 | Temp 97.0°F | Ht 63.25 in | Wt 145.6 lb

## 2019-04-26 DIAGNOSIS — Z0000 Encounter for general adult medical examination without abnormal findings: Secondary | ICD-10-CM

## 2019-04-26 DIAGNOSIS — E1122 Type 2 diabetes mellitus with diabetic chronic kidney disease: Secondary | ICD-10-CM | POA: Diagnosis not present

## 2019-04-26 DIAGNOSIS — F17200 Nicotine dependence, unspecified, uncomplicated: Secondary | ICD-10-CM

## 2019-04-26 DIAGNOSIS — Z0001 Encounter for general adult medical examination with abnormal findings: Secondary | ICD-10-CM | POA: Diagnosis not present

## 2019-04-26 DIAGNOSIS — K769 Liver disease, unspecified: Secondary | ICD-10-CM

## 2019-04-26 DIAGNOSIS — K7469 Other cirrhosis of liver: Secondary | ICD-10-CM

## 2019-04-26 DIAGNOSIS — F329 Major depressive disorder, single episode, unspecified: Secondary | ICD-10-CM

## 2019-04-26 DIAGNOSIS — I2510 Atherosclerotic heart disease of native coronary artery without angina pectoris: Secondary | ICD-10-CM

## 2019-04-26 DIAGNOSIS — I700 Atherosclerosis of aorta: Secondary | ICD-10-CM

## 2019-04-26 DIAGNOSIS — F32A Depression, unspecified: Secondary | ICD-10-CM | POA: Insufficient documentation

## 2019-04-26 DIAGNOSIS — R160 Hepatomegaly, not elsewhere classified: Secondary | ICD-10-CM

## 2019-04-26 DIAGNOSIS — R6889 Other general symptoms and signs: Secondary | ICD-10-CM | POA: Diagnosis not present

## 2019-04-26 DIAGNOSIS — E782 Mixed hyperlipidemia: Secondary | ICD-10-CM

## 2019-04-26 DIAGNOSIS — C50312 Malignant neoplasm of lower-inner quadrant of left female breast: Secondary | ICD-10-CM

## 2019-04-26 DIAGNOSIS — Z79899 Other long term (current) drug therapy: Secondary | ICD-10-CM

## 2019-04-26 DIAGNOSIS — I10 Essential (primary) hypertension: Secondary | ICD-10-CM

## 2019-04-26 DIAGNOSIS — M8949 Other hypertrophic osteoarthropathy, multiple sites: Secondary | ICD-10-CM | POA: Diagnosis not present

## 2019-04-26 DIAGNOSIS — J449 Chronic obstructive pulmonary disease, unspecified: Secondary | ICD-10-CM | POA: Diagnosis not present

## 2019-04-26 DIAGNOSIS — J3089 Other allergic rhinitis: Secondary | ICD-10-CM

## 2019-04-26 DIAGNOSIS — E559 Vitamin D deficiency, unspecified: Secondary | ICD-10-CM

## 2019-04-26 DIAGNOSIS — K5730 Diverticulosis of large intestine without perforation or abscess without bleeding: Secondary | ICD-10-CM | POA: Diagnosis not present

## 2019-04-26 DIAGNOSIS — H548 Legal blindness, as defined in USA: Secondary | ICD-10-CM

## 2019-04-26 DIAGNOSIS — Z170 Estrogen receptor positive status [ER+]: Secondary | ICD-10-CM

## 2019-04-26 DIAGNOSIS — M818 Other osteoporosis without current pathological fracture: Secondary | ICD-10-CM | POA: Diagnosis not present

## 2019-04-26 DIAGNOSIS — Z23 Encounter for immunization: Secondary | ICD-10-CM | POA: Diagnosis not present

## 2019-04-26 DIAGNOSIS — N182 Chronic kidney disease, stage 2 (mild): Secondary | ICD-10-CM

## 2019-04-26 DIAGNOSIS — M159 Polyosteoarthritis, unspecified: Secondary | ICD-10-CM

## 2019-04-26 DIAGNOSIS — J302 Other seasonal allergic rhinitis: Secondary | ICD-10-CM

## 2019-04-26 MED ORDER — GABAPENTIN 300 MG PO CAPS: 300.0000 mg | ORAL_CAPSULE | Freq: Three times a day (TID) | ORAL | 1 refills | Status: DC | PRN

## 2019-04-26 MED ORDER — ESCITALOPRAM OXALATE 10 MG PO TABS: 10.0000 mg | ORAL_TABLET | Freq: Every day | ORAL | 1 refills | Status: DC

## 2019-04-26 NOTE — Patient Instructions (Signed)
[name] , Thank you for taking time to come for your Medicare Wellness Visit. I appreciate your ongoing commitment to your health goals. Please review the following plan we discussed and let me know if I can assist you in the future.    This is a list of the screening recommended for you and due dates:  Health Maintenance  Topic Date Due  . Complete foot exam   09/24/2018  . Hemoglobin A1C  01/15/2019  . Flu Shot  02/20/2019  . DEXA scan (bone density measurement)  Completed  . Pneumonia vaccines  Completed  . Eye exam for diabetics  Discontinued  . Tetanus Vaccine  Discontinued    I sent in lexapro 10 mg tabs to start taking daily for depressed mood/anxiety; need to continue for 8-12 weeks to see if this helps, UNLESS you are having bad side effects - then call and let me know.    Escitalopram tablets What is this medicine? ESCITALOPRAM (es sye TAL oh pram) is used to treat depression and certain types of anxiety. This medicine may be used for other purposes; ask your health care provider or pharmacist if you have questions. COMMON BRAND NAME(S): Lexapro What should I tell my health care provider before I take this medicine? They need to know if you have any of these conditions:  bipolar disorder or a family history of bipolar disorder  diabetes  glaucoma  heart disease  kidney or liver disease  receiving electroconvulsive therapy  seizures (convulsions)  suicidal thoughts, plans, or attempt by you or a family member  an unusual or allergic reaction to escitalopram, the related drug citalopram, other medicines, foods, dyes, or preservatives  pregnant or trying to become pregnant  breast-feeding How should I use this medicine? Take this medicine by mouth with a glass of water. Follow the directions on the prescription label. You can take it with or without food. If it upsets your stomach, take it with food. Take your medicine at regular intervals. Do not take it  more often than directed. Do not stop taking this medicine suddenly except upon the advice of your doctor. Stopping this medicine too quickly may cause serious side effects or your condition may worsen. A special MedGuide will be given to you by the pharmacist with each prescription and refill. Be sure to read this information carefully each time. Talk to your pediatrician regarding the use of this medicine in children. Special care may be needed. Overdosage: If you think you have taken too much of this medicine contact a poison control center or emergency room at once. NOTE: This medicine is only for you. Do not share this medicine with others. What if I miss a dose? If you miss a dose, take it as soon as you can. If it is almost time for your next dose, take only that dose. Do not take double or extra doses. What may interact with this medicine? Do not take this medicine with any of the following medications:  certain medicines for fungal infections like fluconazole, itraconazole, ketoconazole, posaconazole, voriconazole  cisapride  citalopram  dronedarone  linezolid  MAOIs like Carbex, Eldepryl, Marplan, Nardil, and Parnate  methylene blue (injected into a vein)  pimozide  thioridazine This medicine may also interact with the following medications:  alcohol  amphetamines  aspirin and aspirin-like medicines  carbamazepine  certain medicines for depression, anxiety, or psychotic disturbances  certain medicines for migraine headache like almotriptan, eletriptan, frovatriptan, naratriptan, rizatriptan, sumatriptan, zolmitriptan  certain medicines  for sleep  certain medicines that treat or prevent blood clots like warfarin, enoxaparin, dalteparin  cimetidine  diuretics  dofetilide  fentanyl  furazolidone  isoniazid  lithium  metoprolol  NSAIDs, medicines for pain and inflammation, like ibuprofen or naproxen  other medicines that prolong the QT interval  (cause an abnormal heart rhythm)  procarbazine  rasagiline  supplements like St. John's wort, kava kava, valerian  tramadol  tryptophan  ziprasidone This list may not describe all possible interactions. Give your health care provider a list of all the medicines, herbs, non-prescription drugs, or dietary supplements you use. Also tell them if you smoke, drink alcohol, or use illegal drugs. Some items may interact with your medicine. What should I watch for while using this medicine? Tell your doctor if your symptoms do not get better or if they get worse. Visit your doctor or health care professional for regular checks on your progress. Because it may take several weeks to see the full effects of this medicine, it is important to continue your treatment as prescribed by your doctor. Patients and their families should watch out for new or worsening thoughts of suicide or depression. Also watch out for sudden changes in feelings such as feeling anxious, agitated, panicky, irritable, hostile, aggressive, impulsive, severely restless, overly excited and hyperactive, or not being able to sleep. If this happens, especially at the beginning of treatment or after a change in dose, call your health care professional. Dennis Bast may get drowsy or dizzy. Do not drive, use machinery, or do anything that needs mental alertness until you know how this medicine affects you. Do not stand or sit up quickly, especially if you are an older patient. This reduces the risk of dizzy or fainting spells. Alcohol may interfere with the effect of this medicine. Avoid alcoholic drinks. Your mouth may get dry. Chewing sugarless gum or sucking hard candy, and drinking plenty of water may help. Contact your doctor if the problem does not go away or is severe. What side effects may I notice from receiving this medicine? Side effects that you should report to your doctor or health care professional as soon as possible:  allergic  reactions like skin rash, itching or hives, swelling of the face, lips, or tongue  anxious  black, tarry stools  changes in vision  confusion  elevated mood, decreased need for sleep, racing thoughts, impulsive behavior  eye pain  fast, irregular heartbeat  feeling faint or lightheaded, falls  feeling agitated, angry, or irritable  hallucination, loss of contact with reality  loss of balance or coordination  loss of memory  painful or prolonged erections  restlessness, pacing, inability to keep still  seizures  stiff muscles  suicidal thoughts or other mood changes  trouble sleeping  unusual bleeding or bruising  unusually weak or tired  vomiting Side effects that usually do not require medical attention (report to your doctor or health care professional if they continue or are bothersome):  changes in appetite  change in sex drive or performance  headache  increased sweating  indigestion, nausea  tremors This list may not describe all possible side effects. Call your doctor for medical advice about side effects. You may report side effects to FDA at 1-800-FDA-1088. Where should I keep my medicine? Keep out of reach of children. Store at room temperature between 15 and 30 degrees C (59 and 86 degrees F). Throw away any unused medicine after the expiration date. NOTE: This sheet is a summary. It may  not cover all possible information. If you have questions about this medicine, talk to your doctor, pharmacist, or health care provider.  2020 Elsevier/Gold Standard (2018-06-29 11:21:44)

## 2019-04-26 NOTE — Telephone Encounter (Signed)
This RN placed a Hospice referral to [facility] per MD visit 04/22/2019.

## 2019-04-27 ENCOUNTER — Telehealth: Payer: Self-pay

## 2019-04-27 LAB — LIPID PANEL
Cholesterol: 149 mg/dL (ref <200)
HDL: 55 mg/dL (ref >=50)
LDL Cholesterol (Calc): 70 mg/dL (calc)
Non-HDL Cholesterol (Calc): 94 mg/dL (calc) (ref <130)
Total CHOL/HDL Ratio: 2.7 (calc) (ref <5.0)
Triglycerides: 164 mg/dL — ABNORMAL HIGH (ref <150)

## 2019-04-27 LAB — CBC WITH DIFFERENTIAL/PLATELET
Absolute Monocytes: 840 cells/uL (ref 200–950)
Basophils Absolute: 58 cells/uL (ref 0–200)
Basophils Relative: 0.5 %
Eosinophils Absolute: 115 cells/uL (ref 15–500)
Eosinophils Relative: 1 %
HCT: 37.1 % (ref 35.0–45.0)
Hemoglobin: 12.9 g/dL (ref 11.7–15.5)
Lymphs Abs: 3485 cells/uL (ref 850–3900)
MCH: 30.9 pg (ref 27.0–33.0)
MCHC: 34.8 g/dL (ref 32.0–36.0)
MCV: 89 fL (ref 80.0–100.0)
MPV: 9.9 fL (ref 7.5–12.5)
Monocytes Relative: 7.3 %
Neutro Abs: 7004 cells/uL (ref 1500–7800)
Neutrophils Relative %: 60.9 %
Platelets: 414 10*3/uL — ABNORMAL HIGH (ref 140–400)
RBC: 4.17 10*6/uL (ref 3.80–5.10)
RDW: 13.2 % (ref 11.0–15.0)
Total Lymphocyte: 30.3 %
WBC: 11.5 10*3/uL — ABNORMAL HIGH (ref 3.8–10.8)

## 2019-04-27 LAB — COMPLETE METABOLIC PANEL WITH GFR
AG Ratio: 1.3 (calc) (ref 1.0–2.5)
ALT: 16 U/L (ref 6–29)
AST: 19 U/L (ref 10–35)
Albumin: 4.2 g/dL (ref 3.6–5.1)
Alkaline phosphatase (APISO): 77 U/L (ref 37–153)
BUN: 17 mg/dL (ref 7–25)
CO2: 24 mmol/L (ref 20–32)
Calcium: 10.4 mg/dL (ref 8.6–10.4)
Chloride: 104 mmol/L (ref 98–110)
Creat: 0.7 mg/dL (ref 0.60–0.93)
GFR, Est African American: 96 mL/min/{1.73_m2} (ref >=60)
GFR, Est Non African American: 83 mL/min/{1.73_m2} (ref >=60)
Globulin: 3.3 g/dL (calc) (ref 1.9–3.7)
Glucose, Bld: 134 mg/dL — ABNORMAL HIGH (ref 65–99)
Potassium: 4.2 mmol/L (ref 3.5–5.3)
Sodium: 137 mmol/L (ref 135–146)
Total Bilirubin: 0.4 mg/dL (ref 0.2–1.2)
Total Protein: 7.5 g/dL (ref 6.1–8.1)

## 2019-04-27 LAB — TSH: TSH: 2.07 mIU/L (ref 0.40–4.50)

## 2019-04-27 LAB — MAGNESIUM: Magnesium: 2.1 mg/dL (ref 1.5–2.5)

## 2019-04-27 LAB — HEMOGLOBIN A1C
Hgb A1c MFr Bld: 6.3 % of total Hgb — ABNORMAL HIGH (ref <5.7)
Mean Plasma Glucose: 134 (calc)
eAG (mmol/L): 7.4 (calc)

## 2019-04-27 NOTE — Telephone Encounter (Signed)
Received a call from Hospice admission RN, Butch Penny, who shared that patient did not admit to hospice today. Patient is interested in having Palliative Care services. Message sent to Dr. [name] and Mateo Flow RN

## 2019-05-03 ENCOUNTER — Other Ambulatory Visit: Payer: PPO | Admitting: Hospice

## 2019-05-03 ENCOUNTER — Other Ambulatory Visit: Payer: Self-pay

## 2019-05-03 DIAGNOSIS — Z515 Encounter for palliative care: Secondary | ICD-10-CM | POA: Diagnosis not present

## 2019-05-03 NOTE — Progress Notes (Signed)
[facility] Consult Note Telephone: [phone]  Fax: [phone]  PATIENT [name] DOB: April 11, 1941 MRN: [id number]  PRIMARY CARE PROVIDER:   [name], MD  REFERRING PROVIDER:  [name], MD [address] [place],  [place] [address]  RESPONSIBLE PARTY:   Self Emergency contact/NOK: [name] [phone]  Due to the COVID-19 crisis, this virtual check-in visit was done via telephone from my office and it was initiated and consent given by this patient and or family.   RECOMMENDATIONS/PLAN:  1. Advance Care Planning/Goals of Care: Goals include to maximize quality of life and symptom management. Visit consisted of discussions on Palliative Medicine as specialized medical care for people living with serious illness, aimed at facilitating advance care plan, symptoms relief and establishing goals of care. Educated on importance of advance directive, code status, end of life wishes with chronic progressive disease. Patient affirmed she is a full code ' If I flat out, I want resuscitation' but does not want to be kept alive when she is in a vegetative state. Will continue discussions on goals of care clarification, MOST form.   2. Symptom management: Degenerative joint disease - shoulders, lower back, feet. Currently managed with a combination of Tylenol, Aleve and Gabapentin - effective. Patient gets around with her rolling walker but sits mostly in her electric wheelchair.  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 But no chemo/radiation treatment at this time.   3. Follow up Palliative Care Visit: Palliative care will continue to follow for goals of care clarification and symptom management.   I spent 40 minutes providing this consultation, from 12.30pm  to 1.10pm. More than 50% of the time in this consultation was spent on coordinating advance care communication.  HISTORY OF PRESENT ILLNESS:  [name] is a 78 y.o. 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 Palliative Care was asked to help address goals of care.   CODE STATUS: FULL  PPS: 50% HOSPICE ELIGIBILITY/DIAGNOSIS: TBD  PAST MEDICAL HISTORY:  Past Medical History:  Diagnosis Date  . Allergic rhinitis   . ASCVD (arteriosclerotic cardiovascular disease)   . Atherosclerosis of aorta ([place]) 2014   via CT AB/Pelvis- no AAA  . COPD (chronic obstructive pulmonary disease) ([place])   . DJD (degenerative joint disease)    neck, lower back, feet, hands  . Dyspnea   . Elevated hemoglobin A1c measurement   . Pain 03/2019   right chest/ breast area, worse with inspiration  . Tobacco abuse     SOCIAL HX:  Social History   Tobacco Use  . Smoking status: Current Every Day Smoker    Packs/day: 0.75    Years: 55.00    Pack years: 41.25    Types: Cigarettes  . Smokeless tobacco: Never Used  Substance Use Topics  . Alcohol use: No    ALLERGIES:  Allergies  Allergen Reactions  . Bisoprolol Other (See Comments)    Decreased Heart Rate  . Ace Inhibitors Cough  . Acyclovir And Related Rash and Other (See Comments)    Okay to take famvir  . Amoxil [Amoxicillin] Diarrhea  . Ciprofloxacin Diarrhea  . Crestor [Rosuvastatin] Other (See Comments)    Elevated LFT's and Myalgias  .  Doxycycline Diarrhea  . Enalapril Cough  . Lipitor [Atorvastatin] Other (See Comments)    Elevated LFT's and Myalgias  . Metformin And Related Diarrhea  . Prednisone Other (See Comments)    Intolerant to High Dose Prednisone  . Vicodin [Hydrocodone-Acetaminophen] Other (See Comments)    Increased Anxiety and Nervousness      PERTINENT MEDICATIONS:  Outpatient Encounter Medications as of 05/03/2019  Medication Sig  . acetaminophen (TYLENOL) 500 MG tablet Take 500 mg by mouth every 6 (six) hours as needed (takes capsule).  Marland Kitchen albuterol (PROVENTIL HFA;VENTOLIN HFA) 108 (90 BASE) MCG/ACT inhaler Inhale 2 puffs into the lungs every 6 (six) hours as needed for wheezing or shortness of breath.  Marland Kitchen aspirin 81 MG tablet Take 81 mg by mouth daily.    . busPIRone (BUSPAR) 5 MG tablet Take 1 tablet (5 mg total) by mouth 3 (three) times daily. For anxiety. (Patient not taking: Reported on 04/26/2019)  . diclofenac sodium (VOLTAREN) 1 % GEL Apply 4 g topically 4 (four) times daily. (Patient not taking: Reported on 04/26/2019)  . escitalopram (LEXAPRO) 10 MG tablet Take 1 tablet (10 mg total) by mouth daily.  . fenofibrate micronized (LOFIBRA) 134 MG capsule Take 1 capsule by mouth once daily  . fish oil-omega-3 fatty acids 1000 MG capsule Take 1 capsule by mouth 3 (three) times daily.   . Flaxseed, Linseed, (FLAX SEED OIL) 1000 MG CAPS Take 1,000 mg by mouth 3 (three) times daily.   . fluticasone (FLONASE) 50 MCG/ACT nasal spray Place 2 sprays into both nostrils daily. (Patient taking differently: Place 2 sprays into both nostrils as needed. )  . fluticasone furoate-vilanterol (BREO ELLIPTA) 100-25 MCG/INH AEPB Inhale 1 puff into the lungs daily. Rinse mouth with water after each use (Patient taking differently: Inhale 1 puff into the lungs as needed. Rinse mouth with water after each use)  . Magnesium 500 MG TABS Take 500 mg by mouth 2 (two) times daily.   . meloxicam (MOBIC) 15 MG tablet Take one daily with food for 2 weeks, can take with tylenol, can not take with aleve, iburpofen, then as needed daily for pain (Patient not taking: Reported on 02/22/2019)  . metFORMIN (GLUCOPHAGE-XR) 500 MG 24 hr tablet Take 2 tablets 2 x /day with meals for Diabetes  . naproxen sodium (ALEVE) 220 MG tablet Take 220 mg by mouth.  . sucralfate  (CARAFATE) 1 g tablet Take 1 tablet (1 g total) by mouth 4 (four) times daily. Take with glass of water, or dissolve in 4 oz of water. Take before each meal and prior to bedtime. (Patient not taking: Reported on 04/26/2019)  . telmisartan (MICARDIS) 80 MG tablet Take 1 tablet by mouth once daily for blood pressure   No facility-administered encounter medications on file as of 05/03/2019.     Teodoro Spray, NP

## 2019-05-05 ENCOUNTER — Other Ambulatory Visit: Payer: Self-pay

## 2019-05-05 ENCOUNTER — Encounter (HOSPITAL_COMMUNITY): Payer: Self-pay | Admitting: Radiology

## 2019-05-05 ENCOUNTER — Ambulatory Visit (HOSPITAL_COMMUNITY)
Admission: RE | Admit: 2019-05-05 | Discharge: 2019-05-05 | Disposition: A | Discharge status: Home / Self Care | Payer: PPO | Source: Ambulatory Visit | Attending: Oncology | Admitting: Oncology

## 2019-05-05 DIAGNOSIS — M25511 Pain in right shoulder: Secondary | ICD-10-CM | POA: Insufficient documentation

## 2019-05-05 DIAGNOSIS — M19011 Primary osteoarthritis, right shoulder: Secondary | ICD-10-CM | POA: Diagnosis not present

## 2019-05-05 MED ORDER — GADOBUTROL 1 MMOL/ML IV SOLN
6.0000 mL | Freq: Once | INTRAVENOUS | Status: AC | PRN
  Administered 2019-05-05: 6 mL via INTRAVENOUS

## 2019-05-06 ENCOUNTER — Telehealth: Payer: Self-pay | Admitting: *Deleted

## 2019-05-06 NOTE — Telephone Encounter (Signed)
Per Dr. [name], based on pt Right Shoulder MRI results, pt needs to be seen by an orthopedic surgeon.  RN placed call to pt to explain MRI results.  Pt states she is a current patient at [facility] and she will call their office to schedule and apt.

## 2019-05-21 DIAGNOSIS — M19011 Primary osteoarthritis, right shoulder: Secondary | ICD-10-CM | POA: Diagnosis not present

## 2019-05-24 ENCOUNTER — Other Ambulatory Visit: Payer: PPO | Admitting: Hospice

## 2019-05-24 ENCOUNTER — Other Ambulatory Visit: Payer: Self-pay

## 2019-05-24 DIAGNOSIS — Z515 Encounter for palliative care: Secondary | ICD-10-CM

## 2019-05-24 NOTE — Progress Notes (Addendum)
[facility] Consult Note Telephone: [phone]  Fax: [phone]  PATIENT NAME: [name] DOB: 1941-01-18 MRN: [id number]  PRIMARY CARE PROVIDER:   [name], MD  REFERRING PROVIDER:  [name], MD [address] [place],  [place] [address]  RESPONSIBLE PARTY:   Self Emergency contact/NOK: Belanna Manring [phone]  RECOMMENDATIONS/PLAN: 1. Advance Care Planning/Goals of Care:  Visit consisted of building trust and  discussions on Palliative Medicine as specialized medical care for people living with serious illness, aimed at facilitating advance care plan, symptoms relief and establishing goals of care. Patient affirmed she is a full code. She said she is going for full body scan to detect CA metastasis. She said she has decided not to do chemotherapy no matter what the results are. She is a full code; will continue discussions on goals of care clarification. 2. Symptom management: Degenerative joint disease -Right shoulder pain. Patient saw Orthopedic surgeon last week and received cortisone shot; repeat in 3 months. She said she feels a lot better than before. .  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 But no chemo/radiation treatment at this time.  3. Follow up Palliative Care Visit: Palliative care will continue to follow for goals of care clarification and symptom management.  I spent 30  minutes providing this consultation,  from 11.30am to 12pm. More than 50% of the time in this consultation was spent coordinating communication.  HISTORY OF PRESENT ILLNESS:  [name] is a 78 y.o. 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 Palliative Care was asked to help address goals of care. CODE STATUS: Full PPS: 50% HOSPICE ELIGIBILITY/DIAGNOSIS: TBD PAST MEDICAL HISTORY:  Past Medical History:  Diagnosis Date  . Allergic rhinitis   . ASCVD (arteriosclerotic cardiovascular disease)   . Atherosclerosis of aorta ([place]) 2014   via CT AB/Pelvis- no AAA  . COPD (chronic obstructive pulmonary disease) ([place])   . DJD (degenerative joint disease)    neck, lower back, feet, hands  . Dyspnea   . Elevated hemoglobin A1c measurement   . Pain 03/2019   right chest/ breast area, worse with inspiration  . Tobacco abuse     SOCIAL HX:  Social History   Tobacco Use  . Smoking status: Current Every Day Smoker    Packs/day: 0.75    Years: 55.00    Pack years: 41.25    Types: Cigarettes  . Smokeless tobacco: Never Used  Substance Use Topics  . Alcohol use: No    ALLERGIES:  Allergies  Allergen Reactions  . Bisoprolol Other (See Comments)    Decreased Heart Rate  . Ace Inhibitors Cough  . Acyclovir And Related Rash and Other (See Comments)    Okay to take famvir  . Amoxil [Amoxicillin] Diarrhea  . Ciprofloxacin Diarrhea  . Crestor [Rosuvastatin] Other (See Comments)    Elevated LFT's and Myalgias  . Doxycycline Diarrhea  . Enalapril Cough  . Lipitor [Atorvastatin] Other (See Comments)    Elevated LFT's and Myalgias  . Metformin And Related Diarrhea  . Prednisone Other (See Comments)    Intolerant to High Dose Prednisone  . Vicodin [Hydrocodone-Acetaminophen] Other (See Comments)    Increased Anxiety and Nervousness  PERTINENT MEDICATIONS:  Outpatient Encounter Medications as of 05/24/2019  Medication Sig  . acetaminophen (TYLENOL) 500 MG tablet Take 500 mg by mouth every 6 (six) hours as needed (takes capsule).  Marland Kitchen albuterol (PROVENTIL HFA;VENTOLIN HFA) 108 (90 BASE) MCG/ACT inhaler Inhale 2 puffs into the lungs every 6 (six) hours as needed for wheezing or shortness  of breath.  Marland Kitchen aspirin 81 MG tablet Take 81 mg by mouth daily.    . busPIRone (BUSPAR) 5 MG tablet Take 1 tablet (5 mg total) by mouth 3 (three) times daily. For anxiety. (Patient not taking: Reported on 04/26/2019)  . diclofenac sodium (VOLTAREN) 1 % GEL Apply 4 g topically 4 (four) times daily. (Patient not taking: Reported on 04/26/2019)  . escitalopram (LEXAPRO) 10 MG tablet Take 1 tablet (10 mg total) by mouth daily.  . fenofibrate micronized (LOFIBRA) 134 MG capsule Take 1 capsule by mouth once daily  . fish oil-omega-3 fatty acids 1000 MG capsule Take 1 capsule by mouth 3 (three) times daily.   . Flaxseed, Linseed, (FLAX SEED OIL) 1000 MG CAPS Take 1,000 mg by mouth 3 (three) times daily.   . fluticasone (FLONASE) 50 MCG/ACT nasal spray Place 2 sprays into both nostrils daily. (Patient taking differently: Place 2 sprays into both nostrils as needed. )  . fluticasone furoate-vilanterol (BREO ELLIPTA) 100-25 MCG/INH AEPB Inhale 1 puff into the lungs daily. Rinse mouth with water after each use (Patient taking differently: Inhale 1 puff into the lungs as needed. Rinse mouth with water after each use)  . Magnesium 500 MG TABS Take 500 mg by mouth 2 (two) times daily.   . meloxicam (MOBIC) 15 MG tablet Take one daily with food for 2 weeks, can take with tylenol, can not take with aleve, iburpofen, then as needed daily for pain (Patient not taking: Reported on 02/22/2019)  . metFORMIN (GLUCOPHAGE-XR) 500 MG 24 hr tablet Take 2 tablets 2 x /day with meals for Diabetes  . naproxen sodium (ALEVE) 220 MG tablet Take 220 mg by mouth.  . sucralfate (CARAFATE) 1 g tablet Take 1 tablet (1 g total) by mouth 4 (four) times daily. Take with glass of water, or dissolve in 4 oz of water. Take before each meal and prior to bedtime. (Patient not taking: Reported on 04/26/2019)  . telmisartan (MICARDIS) 80 MG tablet Take 1 tablet by mouth once daily for blood pressure   No facility-administered encounter medications on  file as of 05/24/2019.     ROS/.PHYSICAL EXAM:   General: in no acute distress Cardiovascular: regular rate and rhythm Pulmonary: no adventitious lung sounds; normal respiratory effort, no SOB Abdomen: soft, nontender, + bowel sounds GU: no suprapubic tenderness Extremities: chronic edema to left lower extremity, non pitting. Skin: no rashes/wound on exposed skin Neurological: Weakness but otherwise nonfocal, alert and oriented x 3  Teodoro Spray, NP

## 2019-05-25 ENCOUNTER — Other Ambulatory Visit: Payer: Self-pay | Admitting: Internal Medicine

## 2019-05-27 ENCOUNTER — Encounter: Payer: Self-pay | Admitting: Internal Medicine

## 2019-05-27 ENCOUNTER — Ambulatory Visit (INDEPENDENT_AMBULATORY_CARE_PROVIDER_SITE_OTHER): Payer: PPO | Admitting: Internal Medicine

## 2019-05-27 ENCOUNTER — Other Ambulatory Visit: Payer: Self-pay

## 2019-05-27 VITALS — BP 160/84 | HR 64 | Temp 97.2°F | Resp 16

## 2019-05-27 DIAGNOSIS — Z79899 Other long term (current) drug therapy: Secondary | ICD-10-CM | POA: Diagnosis not present

## 2019-05-27 DIAGNOSIS — C787 Secondary malignant neoplasm of liver and intrahepatic bile duct: Secondary | ICD-10-CM

## 2019-05-27 DIAGNOSIS — R0600 Dyspnea, unspecified: Secondary | ICD-10-CM | POA: Diagnosis not present

## 2019-05-27 DIAGNOSIS — I10 Essential (primary) hypertension: Secondary | ICD-10-CM | POA: Diagnosis not present

## 2019-05-27 DIAGNOSIS — I82402 Acute embolism and thrombosis of unspecified deep veins of left lower extremity: Secondary | ICD-10-CM

## 2019-05-27 DIAGNOSIS — R609 Edema, unspecified: Secondary | ICD-10-CM

## 2019-05-27 DIAGNOSIS — R0609 Other forms of dyspnea: Secondary | ICD-10-CM

## 2019-05-27 MED ORDER — HYDROCHLOROTHIAZIDE 25 MG PO TABS: ORAL_TABLET | ORAL | 1 refills | Status: AC

## 2019-05-27 NOTE — Progress Notes (Signed)
Subjective:    Patient ID: [name], female    DOB: March 11, 1941, 78 y.o.   MRN: [id number]  HPI    This very nice 78 y.o. MWF  is brought in by her son Shiva Karis.   Patient has hx/o HTN, ASCVD/ Lt. CVA & Rt. HP  HLD, T2_DM and Vitamin D Deficiency. Patient also has hx/o Rt Mastectomy (1990) for Ca  and then a Lt Lumpectomy (2016) also for Breast Ca.        More recently in Sept, she had a Liver Bx (+) for Small Cell Ca - unknown primary.  She's scheduled for a Chest CT on Nov 9th by Dr [name].      Patient presents today with a 5-7 day incipient prodrome of lower extremity edema Lt >> Rt . Patient denies CP, SOB.  Medication Sig  . acetaminophen (TYLENOL) 500 MG tablet Take 500 mg by mouth every 6 (six) hours as needed (takes capsule).  Marland Kitchen albuterol (PROVENTIL HFA;VENTOLIN HFA) 108 (90 BASE) MCG/ACT inhaler Inhale 2 puffs into the lungs every 6 (six) hours as needed for wheezing or shortness of breath.  Marland Kitchen aspirin 81 MG tablet Take 81 mg by mouth daily.    . busPIRone (BUSPAR) 5 MG tablet Take 1 tablet (5 mg total) by mouth 3 (three) times daily. For anxiety.  . diclofenac sodium (VOLTAREN) 1 % GEL Apply 4 g topically 4 (four) times daily.  Marland Kitchen escitalopram (LEXAPRO) 10 MG tablet Take 1 tablet (10 mg total) by mouth daily.  . fenofibrate micronized (LOFIBRA) 134 MG capsule Take 1 capsule by mouth once daily  . fish oil-omega-3 fatty acids 1000 MG capsule Take 1 capsule by mouth 3 (three) times daily.   . Flaxseed, Linseed, (FLAX SEED OIL) 1000 MG CAPS Take 1,000 mg by mouth 3 (three) times daily.   . fluticasone (FLONASE) 50 MCG/ACT nasal spray Place 2 sprays into both nostrils daily. (Patient taking differently: Place 2 sprays into both nostrils as needed. )  . fluticasone furoate-vilanterol (BREO ELLIPTA) 100-25 MCG/INH AEPB Inhale 1 puff into the lungs daily. Rinse mouth with water after each use (Patient taking differently: Inhale 1 puff into the lungs as needed. Rinse mouth  with water after each use)  . Magnesium 500 MG TABS Take 500 mg by mouth 2 (two) times daily.   . meloxicam (MOBIC) 15 MG tablet Take one daily with food for 2 weeks, can take with tylenol, can not take with aleve, iburpofen, then as needed daily for pain  . metFORMIN (GLUCOPHAGE-XR) 500 MG 24 hr tablet Take 2 tablets 2 x /day with meals for Diabetes  . naproxen sodium (ALEVE) 220 MG tablet Take 220 mg by mouth.  . sucralfate (CARAFATE) 1 g tablet Take 1 tablet (1 g total) by mouth 4 (four) times daily. Take with glass of water, or dissolve in 4 oz of water. Take before each meal and prior to bedtime.  Marland Kitchen telmisartan (MICARDIS) 80 MG tablet Take 1 tablet by mouth once daily for blood pressure   No facility-administered medications prior to visit.    Allergies  Allergen Reactions  . Bisoprolol Other (See Comments)    Decreased Heart Rate  . Ace Inhibitors Cough  . Acyclovir And Related Rash and Other (See Comments)    Okay to take famvir  . Amoxil [Amoxicillin] Diarrhea  . Ciprofloxacin Diarrhea  . Crestor [Rosuvastatin] Other (See Comments)    Elevated LFT's and Myalgias  . Doxycycline Diarrhea  .  Enalapril Cough  . Lipitor [Atorvastatin] Other (See Comments)    Elevated LFT's and Myalgias  . Metformin And Related Diarrhea  . Prednisone Other (See Comments)    Intolerant to High Dose Prednisone  . Vicodin [Hydrocodone-Acetaminophen] Other (See Comments)    Increased Anxiety and Nervousness    Review of Systems   10 point systems review negative except as above.    Objective:   Physical Exam  BP (!) 160/84   Pulse 64   Temp (!) 97.2 F (36.2 C)   Resp 16   HEENT - WNL. Neck - supple.  Chest - Clear equal BS. Cor - Nl HS. RRR w/o sig MGR. PP 1(+). Asymmetric lower extremity edema - Lt >> Rt. PP are 1-2 +. Nl Capillary refill. (-) Homan's on the Left, but (+) tender with Lt. calf compression.  MS / Neuro - Mild spastic Rt Hemiparesisand limpinggait.    Assessment &  Plan:   1. Acute deep vein thrombosis (DVT) of left lower extremity (HCC)  - Given Sx Xarelto 15 mg #7, to start 2 tabs tonight, and then 1 tablet 2 x /day pending  Results of Venous Dopplers. Son also given Rx for Xarelto Starter pack to hold pending results.   - VAS [place] LOWER EXTREMITY VENOUS (DVT); Future  2. 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 Essential hypertension  - CBC with Diff - COMPLETE METABOLIC PANEL WITH GFR - Magnesium - hydrochlorothiazide (HYDRODIURIL) 25 MG tablet; Take 1 tablet daily for BP and fluid  Dispense: 90 tablet; Refill: 1  4. Edema, unspecified type  - COMPLETE METABOLIC PANEL WITH GFR - Brain natriuretic peptide  - hydrochlorothiazide (HYDRODIURIL) 25 MG tablet; Take 1 tablet daily for BP and fluid  Dispense: 90 tablet; Refill: 1  5. Dyspnea on exertion  - Brain natriuretic peptide   6. Medication management  - CBC with Diff - COMPLETE METABOLIC PANEL WITH GFR - Magnesium - Brain natriuretic peptide  25 minutes of counseling, chart review, and critical decision making was performed

## 2019-05-28 ENCOUNTER — Other Ambulatory Visit: Payer: Self-pay | Admitting: Oncology

## 2019-05-28 ENCOUNTER — Encounter (HOSPITAL_COMMUNITY): Payer: PPO

## 2019-05-28 ENCOUNTER — Ambulatory Visit (HOSPITAL_COMMUNITY)
Admission: RE | Admit: 2019-05-28 | Discharge: 2019-05-28 | Disposition: A | Discharge status: Home / Self Care | Payer: PPO | Source: Ambulatory Visit | Attending: Internal Medicine | Admitting: Internal Medicine

## 2019-05-28 ENCOUNTER — Encounter (HOSPITAL_COMMUNITY): Payer: Self-pay

## 2019-05-28 DIAGNOSIS — I82402 Acute embolism and thrombosis of unspecified deep veins of left lower extremity: Secondary | ICD-10-CM | POA: Diagnosis not present

## 2019-05-28 LAB — CBC WITH DIFFERENTIAL/PLATELET
Absolute Monocytes: 724 cells/uL (ref 200–950)
Basophils Absolute: 13 cells/uL (ref 0–200)
Basophils Relative: 0.1 %
Eosinophils Absolute: 13 cells/uL — ABNORMAL LOW (ref 15–500)
Eosinophils Relative: 0.1 %
HCT: 36 % (ref 35.0–45.0)
Hemoglobin: 12.1 g/dL (ref 11.7–15.5)
Lymphs Abs: 2540 cells/uL (ref 850–3900)
MCH: 30.5 pg (ref 27.0–33.0)
MCHC: 33.6 g/dL (ref 32.0–36.0)
MCV: 90.7 fL (ref 80.0–100.0)
MPV: 10.5 fL (ref 7.5–12.5)
Monocytes Relative: 5.7 %
Neutro Abs: 9411 cells/uL — ABNORMAL HIGH (ref 1500–7800)
Neutrophils Relative %: 74.1 %
Platelets: 280 10*3/uL (ref 140–400)
RBC: 3.97 10*6/uL (ref 3.80–5.10)
RDW: 13.6 % (ref 11.0–15.0)
Total Lymphocyte: 20 %
WBC: 12.7 10*3/uL — ABNORMAL HIGH (ref 3.8–10.8)

## 2019-05-28 LAB — COMPLETE METABOLIC PANEL WITH GFR
AG Ratio: 1.8 (calc) (ref 1.0–2.5)
ALT: 47 U/L — ABNORMAL HIGH (ref 6–29)
AST: 27 U/L (ref 10–35)
Albumin: 4.1 g/dL (ref 3.6–5.1)
Alkaline phosphatase (APISO): 57 U/L (ref 37–153)
BUN/Creatinine Ratio: 43 (calc) — ABNORMAL HIGH (ref 6–22)
BUN: 28 mg/dL — ABNORMAL HIGH (ref 7–25)
CO2: 30 mmol/L (ref 20–32)
Calcium: 9.8 mg/dL (ref 8.6–10.4)
Chloride: 102 mmol/L (ref 98–110)
Creat: 0.65 mg/dL (ref 0.60–0.93)
GFR, Est African American: 99 mL/min/{1.73_m2} (ref >=60)
GFR, Est Non African American: 85 mL/min/{1.73_m2} (ref >=60)
Globulin: 2.3 g/dL (calc) (ref 1.9–3.7)
Glucose, Bld: 142 mg/dL — ABNORMAL HIGH (ref 65–99)
Potassium: 3.4 mmol/L — ABNORMAL LOW (ref 3.5–5.3)
Sodium: 142 mmol/L (ref 135–146)
Total Bilirubin: 0.5 mg/dL (ref 0.2–1.2)
Total Protein: 6.4 g/dL (ref 6.1–8.1)

## 2019-05-28 LAB — BRAIN NATRIURETIC PEPTIDE: Brain Natriuretic Peptide: 158 pg/mL — ABNORMAL HIGH (ref <100)

## 2019-05-28 LAB — MAGNESIUM: Magnesium: 2.4 mg/dL (ref 1.5–2.5)

## 2019-05-28 NOTE — Progress Notes (Unsigned)
Left lower venous has been completed and is negative for DVT. Preliminary results can be found under CV proc through chart review.  Wilkie Aye RVT [facility]

## 2019-05-31 ENCOUNTER — Ambulatory Visit (HOSPITAL_COMMUNITY): Payer: PPO

## 2019-06-02 ENCOUNTER — Ambulatory Visit (HOSPITAL_COMMUNITY)
Admission: RE | Admit: 2019-06-02 | Discharge: 2019-06-02 | Disposition: A | Discharge status: Home / Self Care | Payer: PPO | Source: Ambulatory Visit | Attending: Oncology | Admitting: Oncology

## 2019-06-02 ENCOUNTER — Encounter: Payer: Self-pay | Admitting: Oncology

## 2019-06-02 ENCOUNTER — Other Ambulatory Visit: Payer: Self-pay

## 2019-06-02 ENCOUNTER — Other Ambulatory Visit: Payer: Self-pay | Admitting: Oncology

## 2019-06-02 DIAGNOSIS — C801 Malignant (primary) neoplasm, unspecified: Secondary | ICD-10-CM

## 2019-06-02 DIAGNOSIS — Z170 Estrogen receptor positive status [ER+]: Secondary | ICD-10-CM | POA: Insufficient documentation

## 2019-06-02 DIAGNOSIS — C50312 Malignant neoplasm of lower-inner quadrant of left female breast: Secondary | ICD-10-CM | POA: Diagnosis not present

## 2019-06-02 DIAGNOSIS — C3490 Malignant neoplasm of unspecified part of unspecified bronchus or lung: Secondary | ICD-10-CM | POA: Diagnosis not present

## 2019-06-02 MED ORDER — IOHEXOL 300 MG/ML  SOLN
75.0000 mL | Freq: Once | INTRAMUSCULAR | Status: AC | PRN
  Administered 2019-06-02: 75 mL via INTRAVENOUS

## 2019-06-02 MED ORDER — SODIUM CHLORIDE (PF) 0.9 % IJ SOLN
INTRAMUSCULAR | Status: AC
  Filled 2019-06-02: qty 50

## 2019-06-02 NOTE — Progress Notes (Unsigned)
I called [name] and spoke with her and her son regarding the CT scan.  Recall that she has refused all treatment.  She is currently under hospice care.  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  I will be glad to arrange that for her.  Of note her shoulder pain is much better after cortisone shot given by orthopedics.

## 2019-06-03 ENCOUNTER — Telehealth: Payer: Self-pay

## 2019-06-03 ENCOUNTER — Ambulatory Visit: Payer: PPO | Admitting: Radiation Oncology

## 2019-06-03 ENCOUNTER — Other Ambulatory Visit: Payer: Self-pay | Admitting: Oncology

## 2019-06-03 ENCOUNTER — Inpatient Hospital Stay: Payer: PPO | Attending: Oncology | Admitting: Adult Health

## 2019-06-03 NOTE — Telephone Encounter (Signed)
Spoke with Crystal at [facility] to inform pt has decided to start radiation therapy.  Crystal confirms that pt is being followed by [facility] for palliative care services therefore they will continue to follow.

## 2019-06-03 NOTE — Telephone Encounter (Signed)
Received message from [name] with triage/[facility] that [name] from Dr. Virgie Dad office had called to make Palliative Care aware that patient has decided to begin radiation. Will update Palliative NP

## 2019-06-08 ENCOUNTER — Encounter: Payer: Self-pay | Admitting: Radiation Oncology

## 2019-06-08 ENCOUNTER — Ambulatory Visit
Admission: RE | Admit: 2019-06-08 | Discharge: 2019-06-08 | Disposition: A | Discharge status: Home / Self Care | Payer: PPO | Source: Ambulatory Visit | Attending: Radiation Oncology | Admitting: Radiation Oncology

## 2019-06-08 ENCOUNTER — Other Ambulatory Visit: Payer: Self-pay

## 2019-06-08 DIAGNOSIS — F17210 Nicotine dependence, cigarettes, uncomplicated: Secondary | ICD-10-CM | POA: Diagnosis not present

## 2019-06-08 DIAGNOSIS — C342 Malignant neoplasm of middle lobe, bronchus or lung: Secondary | ICD-10-CM | POA: Diagnosis not present

## 2019-06-08 DIAGNOSIS — I871 Compression of vein: Secondary | ICD-10-CM | POA: Insufficient documentation

## 2019-06-08 DIAGNOSIS — C50312 Malignant neoplasm of lower-inner quadrant of left female breast: Secondary | ICD-10-CM

## 2019-06-08 DIAGNOSIS — Z853 Personal history of malignant neoplasm of breast: Secondary | ICD-10-CM | POA: Diagnosis not present

## 2019-06-08 DIAGNOSIS — C787 Secondary malignant neoplasm of liver and intrahepatic bile duct: Secondary | ICD-10-CM | POA: Diagnosis not present

## 2019-06-08 NOTE — Progress Notes (Signed)
Radiation Oncology         [phone] ________________________________  Initial Outpatient Consultation - Conducted via telephone due to current COVID-19 concerns for limiting patient exposure  I spoke with the patient to conduct this consult visit via telephone to spare the patient unnecessary potential exposure in the healthcare setting during the current COVID-19 pandemic. The patient was notified in advance and was offered a [place] meeting to allow for face to face communication but unfortunately reported that they did not have the appropriate resources/technology to support such a visit and instead preferred to proceed with a telephone consult.   ________________________________  Name: [name]        MRN: [id number]  Date of Service: 06/08/2019 DOB: Apr 12, 1941  CC:[name], MD  Magrinat, Virgie Dad, MD     REFERRING PHYSICIAN: Magrinat, Virgie Dad, MD   DIAGNOSIS: The primary encounter diagnosis was Malignant neoplasm of lower-inner quadrant of left breast in female, estrogen receptor positive ([place]). 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   HISTORY OF PRESENT ILLNESS: [name] is a 78 y.o. female seen at the request of Dr. 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 She has a history of a right breast cancer that was treated with mastectomy in 1990 and no adjuvant therapy was given. She apparently had a uterine cancer in her 36s. She also had a Stage IA [id number] ER/PR positive, HER2 negative left breast cancer. She was diagnosed in April 2016 and underwent a lumpectomy. No nodes were removed. She was offered adjuvant radiotherapy but declined, though she has taken antiestrogen medications. She was found to have abdominal tenderness in the right abdomen and a CXR  On 02/23/2019 revealed no active cardiopulmonary disease and US of the abdomen on 03/15/2019 revealed numerous  lesions in the liver the largest was 4.2 cm in the right hepatic lobe. She also had a biopsy of the liver on 04/08/2019 that revealed metastatic small cell carcinoma consistent with a lung primary. 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 A CT chest on 06/02/2019 revealed bulky disease in the right middle lobe measuring 5.9 x 5.9 cm and interstitial thickening at the right lung base and a nodule in the costodiaphragmatic sulcus measuring 2.2 x 1 cm. A smaller nodule adjacent that was about a centimeter was also noted. She did have bulky precarinal and AP windo adenopathy. There was right paratracheal nodal mass with narrowing of the SVC and potential invasion measuring 3 x 2.3 cm, and engorgement of the right external jugular vein. No edema of the neck was noted. She did also have mass along the posterior right heart extending from the aorta to the inferior aspect of the right hilum measuring 6.5 x 3. 5 cm contiguous with bulky subcarinal nodes. Bulky metastatic disease was also seen in the liver the largest in the right hepatic margin measuring 4.9 x 4.3 cm. She is contacted today by phone to discuss options of palliative radiotherapy as she has decided to forgo any systemic therapy.     PREVIOUS RADIATION THERAPY: No   PAST MEDICAL HISTORY:  Past Medical History:  Diagnosis Date   Allergic rhinitis    ASCVD (arteriosclerotic cardiovascular disease)    Atherosclerosis of aorta ([place])  2014   via CT AB/Pelvis- no AAA   COPD (chronic obstructive pulmonary disease) (HCC)    DJD (degenerative joint disease)    neck, lower back, feet, hands   Dyspnea    Elevated hemoglobin A1c measurement    Pain 03/2019   right chest/ breast area, worse with inspiration   Tobacco abuse        PAST SURGICAL HISTORY: Past Surgical  History:  Procedure Laterality Date   [place]  2005   lower back   bladder tact  1985   BREAST LUMPECTOMY Left 2016   BREAST LUMPECTOMY WITH RADIOACTIVE SEED LOCALIZATION Left 11/30/2014   Procedure: LEFT BREAST LUMPECTOMY WITH RADIOACTIVE SEED LOCALIZATION;  Surgeon: [name], MD;  Location: [facility];  Service: General;  Laterality: Left;   [place]   rightmastectomy-axillary nodes   CHOLECYSTECTOMY  2006   CHOLECYSTECTOMY N/A 2008   Dr. Rosana Hoes   excision of benign salivary tumor Left 1968   EYE SURGERY     bilateral cataract eye surgery - now low vision   LAPAROSCOPIC BILATERAL SALPINGO OOPHERECTOMY Bilateral 11/30/2013   Procedure: LAPAROSCOPIC RIGHT SALPINGO OOPHORECTOMY with extensive lysis of adhesions;  Surgeon: Allena Katz, MD;  Location: [facility];  Service: Gynecology;  Laterality: Bilateral;   MASTECTOMY  1990   Right, no adjuvant   MULTIPLE TOOTH EXTRACTIONS     SHOULDER SURGERY Left 2007   Dr. Percell Miller     FAMILY HISTORY:  Family History  Problem Relation Age of Onset   Heart disease Mother    Cancer Mother        breast   Emphysema Brother    Emphysema Brother    Emphysema Sister    Breast cancer Sister    Allergies Brother        all brothers and sisters   Asthma Sister    Breast cancer Sister    Asthma Sister    Heart disease Sister    Heart disease Brother    Cancer Sister        breast   Colon cancer Neg Hx      SOCIAL HISTORY:  reports that she has been smoking cigarettes. She has a 41.25 pack-year smoking history. She has never used smokeless tobacco. She reports that she does not drink alcohol or use drugs. The patient is widowed and lives in [place]. Her son is quite supportive with her care.   ALLERGIES: Bisoprolol, Ace inhibitors, Acyclovir and related, Amoxil [amoxicillin], Ciprofloxacin, Crestor [rosuvastatin], Doxycycline, Enalapril, Lipitor [atorvastatin],  Metformin and related, Prednisone, and Vicodin [hydrocodone-acetaminophen]   MEDICATIONS:  Current Outpatient Medications  Medication Sig Dispense Refill   acetaminophen (TYLENOL) 500 MG tablet Take 500 mg by mouth every 6 (six) hours as needed (takes capsule).     albuterol (PROVENTIL HFA;VENTOLIN HFA) 108 (90 BASE) MCG/ACT inhaler Inhale 2 puffs into the lungs every 6 (six) hours as needed for wheezing or shortness of breath.     aspirin 81 MG tablet Take 81 mg by mouth daily.       fenofibrate micronized (LOFIBRA) 134 MG capsule Take 1 capsule by mouth once daily 90 capsule 3   fluticasone (FLONASE) 50 MCG/ACT nasal spray Place 2 sprays into both nostrils daily. (Patient taking differently: Place 2 sprays into both nostrils as needed. ) 16 g 1   fluticasone furoate-vilanterol (BREO ELLIPTA) 100-25 MCG/INH AEPB Inhale 1 puff into the lungs daily. Rinse mouth with water after each  use (Patient taking differently: Inhale 1 puff into the lungs as needed. Rinse mouth with water after each use) 1 each 0   hydrochlorothiazide (HYDRODIURIL) 25 MG tablet Take 1 tablet daily for BP and fluid 90 tablet 1   metFORMIN (GLUCOPHAGE-XR) 500 MG 24 hr tablet Take 2 tablets 2 x /day with meals for Diabetes 360 tablet 1   naproxen sodium (ALEVE) 220 MG tablet Take 220 mg by mouth.     sucralfate (CARAFATE) 1 g tablet Take 1 tablet (1 g total) by mouth 4 (four) times daily. Take with glass of water, or dissolve in 4 oz of water. Take before each meal and prior to bedtime. 120 tablet 1   telmisartan (MICARDIS) 80 MG tablet Take 1 tablet by mouth once daily for blood pressure 90 tablet 3   busPIRone (BUSPAR) 5 MG tablet Take 1 tablet (5 mg total) by mouth 3 (three) times daily. For anxiety. (Patient not taking: Reported on 06/08/2019) 90 tablet 0   diclofenac sodium (VOLTAREN) 1 % GEL Apply 4 g topically 4 (four) times daily. (Patient not taking: Reported on 06/08/2019) 100 g 3   escitalopram (LEXAPRO)  10 MG tablet Take 1 tablet (10 mg total) by mouth daily. (Patient not taking: Reported on 06/08/2019) 90 tablet 1   fish oil-omega-3 fatty acids 1000 MG capsule Take 1 capsule by mouth 3 (three) times daily.      Flaxseed, Linseed, (FLAX SEED OIL) 1000 MG CAPS Take 1,000 mg by mouth 3 (three) times daily.      Magnesium 500 MG TABS Take 500 mg by mouth 2 (two) times daily.      meloxicam (MOBIC) 15 MG tablet Take one daily with food for 2 weeks, can take with tylenol, can not take with aleve, iburpofen, then as needed daily for pain (Patient not taking: Reported on 06/08/2019) 30 tablet 1   No current facility-administered medications for this encounter.      REVIEW OF SYSTEMS: On review of systems, the patient reports that she is doing okay. She does get short of breath and at times has some chest pain especially with exertion. She notes bilateral lower extremity edema. 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 She denies any progression of this. She also states her right shoulder is feeling better since her joint injection. She denies any hemoptysis or progressive cough, fevers, chills, night sweats, unintended weight changes. She denies any bowel or bladder disturbances, and denies abdominal pain, nausea or vomiting. She denies any new musculoskeletal or joint aches or pains. A complete review of systems is obtained and is otherwise negative.     PHYSICAL EXAM:  Pain Assessment Pain Score: 7  Pain Frequency: Constant Pain Loc: Generalized/10 Unable to assess due to encounter type.   ECOG = 1  0 - Asymptomatic (Fully active, able to carry on all predisease activities without restriction)  1 - Symptomatic but completely ambulatory (Restricted in physically strenuous activity but ambulatory and able to carry out work of a light or sedentary nature. For example, light housework, office work)  2 - Symptomatic, <50% in bed during the day (Ambulatory and capable of all self care but  unable to carry out any work activities. Up and about more than 50% of waking hours)  3 - Symptomatic, >50% in bed, but not bedbound (Capable of only limited self-care, confined to bed or chair 50% or more of waking hours)  4 - Bedbound (Completely disabled. Cannot carry on any self-care.  Totally confined to bed or chair)  5 - Death   Eustace Pen MM, Creech RH, Tormey DC, et al. [phone]). "Toxicity and response criteria of the [facility]". [place] Oncol. 5 (6): 649-55    LABORATORY DATA:  Lab Results  Component Value Date   WBC 12.7 (H) 05/27/2019   HGB 12.1 05/27/2019   HCT 36.0 05/27/2019   MCV 90.7 05/27/2019   PLT 280 05/27/2019   Lab Results  Component Value Date   NA 142 05/27/2019   K 3.4 (L) 05/27/2019   CL 102 05/27/2019   CO2 30 05/27/2019   Lab Results  Component Value Date   ALT 47 (H) 05/27/2019   AST 27 05/27/2019   ALKPHOS 77 04/08/2019   BILITOT 0.5 05/27/2019      RADIOGRAPHY: Ct Chest W Contrast  Result Date: 06/02/2019 CLINICAL DATA:  Small cell lung cancer, history of brain metastases. Assess for treatment response. EXAM: CT CHEST WITH CONTRAST TECHNIQUE: Multidetector CT imaging of the chest was performed during intravenous contrast administration. CONTRAST:  41m OMNIPAQUE IOHEXOL 300 MG/ML  SOLN COMPARISON:  12/14/2009, as chest x-ray of 02/23/2019 FINDINGS: Cardiovascular: Signs of SVC narrowing and potential SVC invasion due to adjacent nodal mass in the chest. Heart size is stable compared to prior study with invasion of the pericardium along the posterior margin of the heart adjacent to left atrium. Thoracic aortic atherosclerosis and ectasia is unchanged. Central pulmonary vasculature is unremarkable. Mediastinum/Nodes: Mediastinal masses with pericardial and potential vascular invasion with narrowing of the superior vena cava and invasion of the pre pericardial and pericardial fat adjacent to the left atrium near inferior  pulmonary venous ostium. Mass along the posterior right heart extends from the aorta to the inferior aspect of the right hilum measuring 6.5 x 3.5 cm in greatest axial dimension, (image 84, series 2) contiguous with bulky subcarinal lymph nodes, heterogeneous subcarinal lymph nodes measuring 1.8 cm in short axis (image 67, series 2) Bulky precarinal and AP window adenopathy. Right paratracheal nodal mass with SVC narrowing and potential invasion measuring 3.0 x 2.3 cm (image 41, series 2) engorgement of right external jugular vein supports some altered venous return in venous congestion. No signs of edema in the neck. Lungs/Pleura: Signs of pulmonary emphysema with paramediastinal mass suspected to be nodal disease along the right paratracheal chain extending along the mediastinal border into the right lung apex. The greatest portion of the mass is centered within mediastinum. Signs of interstitial thickening at the right lung base, suggested on today's scan. Nodule in the anterior costodiaphragmatic sulcus (image 102, series 7) 2.2 x 1.0 cm. Smaller nodule adjacent to this measuring approximately cm. (Image 67): 5.9 x 5.9 cm right middle lobe nodule just anterior to the major fissure. (Image 65, series 7): 1.3 x 1.2 cm lesion in the azygo esophageal recess. Signs of pleural nodularity (image 47, series 2) measuring approximately 5 mm in thickness in the posteromedial right chest with other subtle areas of nodularity scattered about. Left chest is clear aside from some basilar atelectasis. Upper Abdomen: Bulky hepatic metastatic lesions at least 12 visualized in the upper abdomen, largest along the right hepatic margin inferiorly, patent subsegment VI measuring 4.9 x 4.3 cm. Lesion along the anterior margin of the left hemi liver, lateral section (image 115, series 2) 3.2 cm in greatest axial dimension. No recent comparison imaging is available. Left adrenal thickening, medial limb measuring approximately 12 mm is  suspicious given other findings. No acute process in  the upper abdomen. Musculoskeletal: Diffuse heterogeneity and osteopenia of visualized bones. Findings similar to the study of 12/14/2009. IMPRESSION: 1. Findings of pulmonary lesions and mediastinal adenopathy with pericardial and SVC invasion and signs of SVC narrowing but without occlusion in this patient with known small cell lung cancer. 2. Signs of hepatic metastatic disease. 3. 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 4. Interstitial thickening at the right lung base, may represent lymphangitic tumor. 5. Signs of pulmonary emphysema. 6. Heterogeneity of osseous structures without destructive lesion not changed since 2011 May reflect extensive demineralization. Aortic Atherosclerosis (ICD10-I70.0) and Emphysema (ICD10-J43.9). Electronically Signed   By: Zetta Bills M.D.   On: 06/02/2019 17:08   Vas [place] Lower Extremity Venous (dvt)  Result Date: 05/28/2019  Lower Venous Study Other Indications: Patient complains of left leg swelling and pain for one week.                    She denies any shortness of breath and no history of a DVT. Risk Factors: None identified. Performing Technologist: Wilkie Aye RVT  Examination Guidelines: A complete evaluation includes B-mode imaging, spectral Doppler, color Doppler, and power Doppler as needed of all accessible portions of each vessel. Bilateral testing is considered an integral part of a complete examination. Limited examinations for reoccurring indications may be performed as noted.  +---------+---------------+---------+-----------+----------+--------------+  LEFT      Compressibility Phasicity Spontaneity Properties Thrombus Aging  +---------+---------------+---------+-----------+----------+--------------+  CFV       Full            Yes       Yes                                    +---------+---------------+---------+-----------+----------+--------------+  SFJ       Full            Yes        Yes                                    +---------+---------------+---------+-----------+----------+--------------+  FV Prox   Full            Yes       Yes                                    +---------+---------------+---------+-----------+----------+--------------+  FV Mid    Full            Yes       Yes                                    +---------+---------------+---------+-----------+----------+--------------+  FV Distal Full            Yes       Yes                                    +---------+---------------+---------+-----------+----------+--------------+  PFV       Full                                                             +---------+---------------+---------+-----------+----------+--------------+  POP       Full            Yes       Yes                                    +---------+---------------+---------+-----------+----------+--------------+  PTV       Full            Yes       Yes                                    +---------+---------------+---------+-----------+----------+--------------+  PERO      Full            Yes       Yes                                    +---------+---------------+---------+-----------+----------+--------------+  Gastroc   Full                                                             +---------+---------------+---------+-----------+----------+--------------+  GSV       Full            Yes       Yes                                    +---------+---------------+---------+-----------+----------+--------------+  Summary: Right: No evidence of common femoral vein obstruction. Left: No evidence of deep vein thrombosis in the lower extremity. No indirect evidence of obstruction proximal to the inguinal ligament. No cystic structure found in the popliteal fossa.  *See table(s) above for measurements and observations. Electronically signed by Jenkins Rouge MD on 05/28/2019 at 4:05:29 PM.    Final        IMPRESSION/PLAN: 1. Extensive Stage Small Cell Carcinoma of  the Right lung with liver metastases with SVC Syndrome. Dr. Lisbeth Renshaw discusses the pathology findings and reviews the nature of extensive stage small cell cancer and the scenario for SVC compression. While the patient is not interested in systemic therapy, she would like to focus on quality of life and is interested in palliative forms of treatment for her lung cancer. She knows that the goals of radiotherapy would be palliative and Dr. Lisbeth Renshaw offers a course to  We discussed the risks, benefits, short, and long term effects of radiotherapy, and the patient is interested in proceeding. Dr. Lisbeth Renshaw discusses the delivery and logistics of radiotherapy and anticipates a course of 2 weeks of radiotherapy. She will simulate tomorrow and sign written consent to proceed at that time.  2. Goals of care. The patient is enrolled in palliative care rather than hospice at this time after they met yesterday with the hospice team. They plan to follow up with Dr. [name] moving forward.  The above documentation reflects my direct findings during this shared patient visit. Please see the separate note by Dr. Lisbeth Renshaw on this date for the remainder of the patient's plan of care.    Bryson Ha  Margurite Auerbach, PAC

## 2019-06-08 NOTE — Patient Instructions (Signed)
Coronavirus (COVID-19) Are you at risk?  Are you at risk for the Coronavirus (COVID-19)?  To be considered HIGH RISK for Coronavirus (COVID-19), you have to meet the following criteria:  . Traveled to [place], [place], [place], [place] or [place]; or in the [place] to [place], [place], [place], or [place]; and have fever, cough, and shortness of breath within the last 2 weeks of travel OR . Been in close contact with a person diagnosed with COVID-19 within the last 2 weeks and have fever, cough, and shortness of breath . IF YOU DO NOT MEET THESE CRITERIA, YOU ARE CONSIDERED LOW RISK FOR COVID-19.  What to do if you are HIGH RISK for COVID-19?  . If you are having a medical emergency, call 911. . Seek medical care right away. Before you go to a doctor's office, urgent care or emergency department, call ahead and tell them about your recent travel, contact with someone diagnosed with COVID-19, and your symptoms. You should receive instructions from your physician's office regarding next steps of care.  . When you arrive at healthcare provider, tell the healthcare staff immediately you have returned from visiting [place], [place], [place], [place] or [place]; or traveled in the [place] to [place], [place], [place], or [place]; in the last two weeks or you have been in close contact with a person diagnosed with COVID-19 in the last 2 weeks.   . Tell the health care staff about your symptoms: fever, cough and shortness of breath. . After you have been seen by a medical provider, you will be either: o Tested for (COVID-19) and discharged home on quarantine except to seek medical care if symptoms worsen, and asked to  - Stay home and avoid contact with others until you get your results (4-5 days)  - Avoid travel on public transportation if possible (such as bus, train, or airplane) or o Sent to the Emergency Department by EMS for evaluation, COVID-19 testing, and possible  admission depending on your condition and test results.  What to do if you are LOW RISK for COVID-19?  Reduce your risk of any infection by using the same precautions used for avoiding the common cold or flu:  . Wash your hands often with soap and warm water for at least 20 seconds.  If soap and water are not readily available, use an alcohol-based hand sanitizer with at least 60% alcohol.  . If coughing or sneezing, cover your mouth and nose by coughing or sneezing into the elbow areas of your shirt or coat, into a tissue or into your sleeve (not your hands). . Avoid shaking hands with others and consider head nods or verbal greetings only. . Avoid touching your eyes, nose, or mouth with unwashed hands.  . Avoid close contact with people who are sick. . Avoid places or events with large numbers of people in one location, like concerts or sporting events. . Carefully consider travel plans you have or are making. . If you are planning any travel outside or inside the [place], visit the CDC's Travelers' Health webpage for the latest health notices. . If you have some symptoms but not all symptoms, continue to monitor at home and seek medical attention if your symptoms worsen. . If you are having a medical emergency, call 911.   ADDITIONAL HEALTHCARE OPTIONS FOR PATIENTS  [facility] / e-Visit: [email or web address]         [facility]: [phone]  [facility]: [phone]                   [facility]: [phone]   

## 2019-06-08 NOTE — Progress Notes (Signed)
Histology and Location of Primary Cancer: Small cell lung cancer with history of brain mets 1. Findings of pulmonary lesions and mediastinal adenopathy with pericardial and SVC invasion and signs of SVC narrowing but without occlusion in this patient with known small cell lung cancer. 2. Signs of hepatic metastatic disease. 3. 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 4. Interstitial thickening at the right lung base, may represent lymphangitic tumor. 5. Signs of pulmonary emphysema. 6. Heterogeneity of osseous structures without destructive lesion not changed since 2011 May reflect extensive demineralization.  Location(s) of Symptomatic tumor(s): SVC syndrome per CT chest  Past/Anticipated chemotherapy by medical oncology, if any: palliative  therapy only  Patient's main complaints related to symptomatic tumor(s) are: SOB  Pain on a scale of 0-10 is: 7 Ambulatory status? Walker? Wheelchair?: wheelchair  SAFETY ISSUES:  Prior radiation? no  Pacemaker/ICD? no  Possible current pregnancy? no  Is the patient on methotrexate? no  Additional Complaints / other details: Questions and concerns addressed.

## 2019-06-09 ENCOUNTER — Ambulatory Visit
Admission: RE | Admit: 2019-06-09 | Discharge: 2019-06-09 | Disposition: A | Discharge status: Home / Self Care | Payer: PPO | Source: Ambulatory Visit | Attending: Radiation Oncology | Admitting: Radiation Oncology

## 2019-06-09 ENCOUNTER — Other Ambulatory Visit: Payer: Self-pay

## 2019-06-09 DIAGNOSIS — C781 Secondary malignant neoplasm of mediastinum: Secondary | ICD-10-CM | POA: Insufficient documentation

## 2019-06-09 DIAGNOSIS — C342 Malignant neoplasm of middle lobe, bronchus or lung: Secondary | ICD-10-CM | POA: Diagnosis not present

## 2019-06-09 DIAGNOSIS — Z510 Encounter for antineoplastic radiation therapy: Secondary | ICD-10-CM | POA: Insufficient documentation

## 2019-06-11 DIAGNOSIS — C342 Malignant neoplasm of middle lobe, bronchus or lung: Secondary | ICD-10-CM | POA: Diagnosis not present

## 2019-06-11 DIAGNOSIS — Z510 Encounter for antineoplastic radiation therapy: Secondary | ICD-10-CM | POA: Diagnosis not present

## 2019-06-14 ENCOUNTER — Ambulatory Visit
Admission: RE | Admit: 2019-06-14 | Discharge: 2019-06-14 | Disposition: A | Discharge status: Home / Self Care | Payer: PPO | Source: Ambulatory Visit | Attending: Radiation Oncology | Admitting: Radiation Oncology

## 2019-06-14 ENCOUNTER — Other Ambulatory Visit: Payer: Self-pay

## 2019-06-14 DIAGNOSIS — C342 Malignant neoplasm of middle lobe, bronchus or lung: Secondary | ICD-10-CM | POA: Diagnosis not present

## 2019-06-14 DIAGNOSIS — Z510 Encounter for antineoplastic radiation therapy: Secondary | ICD-10-CM | POA: Diagnosis not present

## 2019-06-14 DIAGNOSIS — C50312 Malignant neoplasm of lower-inner quadrant of left female breast: Secondary | ICD-10-CM

## 2019-06-14 MED ORDER — SONAFINE EX EMUL: 1.0000 "application " | Freq: Two times a day (BID) | CUTANEOUS | Status: DC

## 2019-06-15 ENCOUNTER — Other Ambulatory Visit: Payer: Self-pay

## 2019-06-15 ENCOUNTER — Ambulatory Visit
Admission: RE | Admit: 2019-06-15 | Discharge: 2019-06-15 | Disposition: A | Discharge status: Home / Self Care | Payer: PPO | Source: Ambulatory Visit | Attending: Radiation Oncology | Admitting: Radiation Oncology

## 2019-06-15 DIAGNOSIS — Z510 Encounter for antineoplastic radiation therapy: Secondary | ICD-10-CM | POA: Diagnosis not present

## 2019-06-15 DIAGNOSIS — C342 Malignant neoplasm of middle lobe, bronchus or lung: Secondary | ICD-10-CM | POA: Diagnosis not present

## 2019-06-16 ENCOUNTER — Other Ambulatory Visit: Payer: Self-pay

## 2019-06-16 ENCOUNTER — Ambulatory Visit
Admission: RE | Admit: 2019-06-16 | Discharge: 2019-06-16 | Disposition: A | Discharge status: Home / Self Care | Payer: PPO | Source: Ambulatory Visit | Attending: Radiation Oncology | Admitting: Radiation Oncology

## 2019-06-16 DIAGNOSIS — Z510 Encounter for antineoplastic radiation therapy: Secondary | ICD-10-CM | POA: Diagnosis not present

## 2019-06-16 DIAGNOSIS — C342 Malignant neoplasm of middle lobe, bronchus or lung: Secondary | ICD-10-CM | POA: Diagnosis not present

## 2019-06-21 ENCOUNTER — Ambulatory Visit
Admission: RE | Admit: 2019-06-21 | Discharge: 2019-06-21 | Disposition: A | Discharge status: Home / Self Care | Payer: PPO | Source: Ambulatory Visit | Attending: Radiation Oncology | Admitting: Radiation Oncology

## 2019-06-21 ENCOUNTER — Other Ambulatory Visit: Payer: Self-pay

## 2019-06-21 DIAGNOSIS — C342 Malignant neoplasm of middle lobe, bronchus or lung: Secondary | ICD-10-CM | POA: Diagnosis not present

## 2019-06-21 DIAGNOSIS — Z510 Encounter for antineoplastic radiation therapy: Secondary | ICD-10-CM | POA: Diagnosis not present

## 2019-06-21 NOTE — Progress Notes (Signed)
Addendum: Note from 06/08/2019 was missing the following documentation:    Given current concerns for patient exposure during the COVID-19 pandemic, this encounter was conducted via telephone.  The patient has given verbal consent for this type of encounter. The time spent during this encounter was 30 minutes and 50% of that time was spent in the coordination of her care. The attendants for this meeting included Dr. Lisbeth Renshaw, Shona Simpson, [facility] and Newt Lukes and her son Safiyah Cisney. During the encounter, Dr. Lisbeth Renshaw and Shona Simpson [facility] were located at [facility] Radiation Oncology Department.  Newt Lukes and her son were located at home.     Carola Rhine, PAC

## 2019-06-21 NOTE — Addendum Note (Signed)
Encounter addended by: Hayden Pedro, PA-C on: 06/21/2019 9:14 AM  Actions taken: Level of Service modified, Clinical Note Signed

## 2019-06-22 ENCOUNTER — Ambulatory Visit
Admission: RE | Admit: 2019-06-22 | Discharge: 2019-06-22 | Disposition: A | Discharge status: Home / Self Care | Payer: PPO | Source: Ambulatory Visit | Attending: Radiation Oncology | Admitting: Radiation Oncology

## 2019-06-22 ENCOUNTER — Other Ambulatory Visit: Payer: Self-pay

## 2019-06-22 DIAGNOSIS — C781 Secondary malignant neoplasm of mediastinum: Secondary | ICD-10-CM | POA: Diagnosis not present

## 2019-06-22 DIAGNOSIS — Z510 Encounter for antineoplastic radiation therapy: Secondary | ICD-10-CM | POA: Diagnosis not present

## 2019-06-22 DIAGNOSIS — C342 Malignant neoplasm of middle lobe, bronchus or lung: Secondary | ICD-10-CM | POA: Diagnosis not present

## 2019-06-23 ENCOUNTER — Other Ambulatory Visit: Payer: Self-pay

## 2019-06-23 ENCOUNTER — Ambulatory Visit
Admission: RE | Admit: 2019-06-23 | Discharge: 2019-06-23 | Disposition: A | Discharge status: Home / Self Care | Payer: PPO | Source: Ambulatory Visit | Attending: Radiation Oncology | Admitting: Radiation Oncology

## 2019-06-23 DIAGNOSIS — C342 Malignant neoplasm of middle lobe, bronchus or lung: Secondary | ICD-10-CM | POA: Diagnosis not present

## 2019-06-23 DIAGNOSIS — Z510 Encounter for antineoplastic radiation therapy: Secondary | ICD-10-CM | POA: Diagnosis not present

## 2019-06-24 ENCOUNTER — Other Ambulatory Visit: Payer: Self-pay

## 2019-06-24 ENCOUNTER — Other Ambulatory Visit: Payer: PPO | Admitting: Hospice

## 2019-06-24 ENCOUNTER — Ambulatory Visit
Admission: RE | Admit: 2019-06-24 | Discharge: 2019-06-24 | Disposition: A | Discharge status: Home / Self Care | Payer: PPO | Source: Ambulatory Visit | Attending: Radiation Oncology | Admitting: Radiation Oncology

## 2019-06-24 DIAGNOSIS — C342 Malignant neoplasm of middle lobe, bronchus or lung: Secondary | ICD-10-CM | POA: Diagnosis not present

## 2019-06-24 DIAGNOSIS — Z510 Encounter for antineoplastic radiation therapy: Secondary | ICD-10-CM | POA: Diagnosis not present

## 2019-06-24 DIAGNOSIS — Z515 Encounter for palliative care: Secondary | ICD-10-CM

## 2019-06-24 DIAGNOSIS — Z170 Estrogen receptor positive status [ER+]: Secondary | ICD-10-CM

## 2019-06-24 NOTE — Progress Notes (Signed)
[facility] Consult Note Telephone: [phone]  Fax: [phone]  PATIENT NAME: [name] DOB: 1941/01/13 MRN: [id number]  PRIMARY CARE PROVIDER:   [name], MD  REFERRING PROVIDER:  [name], MD [address] [place],  [place] [address]  RESPONSIBLE PARTY:   Self Emergency contact/NOK: Rylan Kaufmann [phone]  TELEHEALTH VISIT STATEMENT Due to the COVID-19 crisis, this visit was done via telephone from my office. It was initiated and consented to by this patient and/or family.  RECOMMENDATIONS/PLAN:   Advance Care Planning/Goals of Care: Telehealth Visit consisted of building trust and fu on patient's decision whether to start chemo/radiation. She had said she did not want to do chemotherapy. Patient has started on radiation. She remains a full code. Goals of care include to maximize quality of life and symptom management. Symptom management: She continues to see her Oncologoist r/t to history ofEstrogen receptor positive breast cancer (s/p right mastectomy, left lumpectomy). 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 Right shoulder pain improved since she received cortisone shot last month. Patient said she is tolerating radiation fairly well.  Follow up: Palliative care will continue to follow patient for goals of care clarification and symptom management. I spent   minutes providing this consultation, from  More than 50% of the time in this consultation was spent on coordinating communication.  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  CODE STATUS: Full  PPS: 50% HOSPICE ELIGIBILITY/DIAGNOSIS: TBD  PAST MEDICAL HISTORY:  Past Medical History:  Diagnosis Date  . Allergic rhinitis   . ASCVD (arteriosclerotic cardiovascular disease)   . Atherosclerosis of aorta ([place]) 2014   via CT AB/Pelvis- no AAA  . COPD (chronic obstructive pulmonary disease) ([place])   . DJD (degenerative joint disease)    neck, lower back, feet, hands  . Dyspnea   . Elevated hemoglobin A1c measurement   . Pain 03/2019   right chest/ breast area, worse with inspiration  . Tobacco abuse     SOCIAL HX:  Social History   Tobacco Use  . Smoking status: Current Every Day Smoker    Packs/day: 0.75    Years: 55.00    Pack years: 41.25    Types: Cigarettes  . Smokeless tobacco: Never Used  Substance Use Topics  . Alcohol use: No    ALLERGIES:  Allergies  Allergen Reactions  . Bisoprolol Other (See Comments)    Decreased Heart Rate  . Ace Inhibitors Cough  . Acyclovir And Related Rash and Other (See Comments)    Okay to take famvir  . Amoxil [Amoxicillin] Diarrhea  . Ciprofloxacin Diarrhea  . Crestor [Rosuvastatin] Other (See Comments)    Elevated LFT's and Myalgias  . Doxycycline Diarrhea  . Enalapril Cough  . Lipitor [Atorvastatin] Other (See Comments)    Elevated LFT's and Myalgias  . Metformin And Related Diarrhea  . Prednisone Other (See Comments)    Intolerant to High Dose Prednisone  . Vicodin [Hydrocodone-Acetaminophen] Other (See Comments)    Increased Anxiety and Nervousness     PERTINENT MEDICATIONS:  Outpatient Encounter Medications as of 06/24/2019  Medication Sig  . acetaminophen (TYLENOL) 500 MG tablet Take 500 mg by mouth every  6 (six) hours as needed (takes capsule).  Marland Kitchen albuterol (PROVENTIL HFA;VENTOLIN HFA) 108 (90 BASE) MCG/ACT inhaler Inhale 2 puffs into the lungs every 6 (six) hours as needed for wheezing or shortness of breath.  Marland Kitchen aspirin 81 MG tablet Take 81 mg by mouth daily.    . busPIRone  (BUSPAR) 5 MG tablet Take 1 tablet (5 mg total) by mouth 3 (three) times daily. For anxiety. (Patient not taking: Reported on 06/08/2019)  . diclofenac sodium (VOLTAREN) 1 % GEL Apply 4 g topically 4 (four) times daily. (Patient not taking: Reported on 06/08/2019)  . escitalopram (LEXAPRO) 10 MG tablet Take 1 tablet (10 mg total) by mouth daily. (Patient not taking: Reported on 06/08/2019)  . fenofibrate micronized (LOFIBRA) 134 MG capsule Take 1 capsule by mouth once daily  . fish oil-omega-3 fatty acids 1000 MG capsule Take 1 capsule by mouth 3 (three) times daily.   . Flaxseed, Linseed, (FLAX SEED OIL) 1000 MG CAPS Take 1,000 mg by mouth 3 (three) times daily.   . fluticasone (FLONASE) 50 MCG/ACT nasal spray Place 2 sprays into both nostrils daily. (Patient taking differently: Place 2 sprays into both nostrils as needed. )  . fluticasone furoate-vilanterol (BREO ELLIPTA) 100-25 MCG/INH AEPB Inhale 1 puff into the lungs daily. Rinse mouth with water after each use (Patient taking differently: Inhale 1 puff into the lungs as needed. Rinse mouth with water after each use)  . hydrochlorothiazide (HYDRODIURIL) 25 MG tablet Take 1 tablet daily for BP and fluid  . Magnesium 500 MG TABS Take 500 mg by mouth 2 (two) times daily.   . meloxicam (MOBIC) 15 MG tablet Take one daily with food for 2 weeks, can take with tylenol, can not take with aleve, iburpofen, then as needed daily for pain (Patient not taking: Reported on 06/08/2019)  . metFORMIN (GLUCOPHAGE-XR) 500 MG 24 hr tablet Take 2 tablets 2 x /day with meals for Diabetes  . naproxen sodium (ALEVE) 220 MG tablet Take 220 mg by mouth.  . sucralfate (CARAFATE) 1 g tablet Take 1 tablet (1 g total) by mouth 4 (four) times daily. Take with glass of water, or dissolve in 4 oz of water. Take before each meal and prior to bedtime.  Marland Kitchen telmisartan (MICARDIS) 80 MG tablet Take 1 tablet by mouth once daily for blood pressure   No facility-administered encounter  medications on file as of 06/24/2019.     Teodoro Spray, NP

## 2019-06-25 ENCOUNTER — Other Ambulatory Visit: Payer: Self-pay | Admitting: Radiation Oncology

## 2019-06-25 ENCOUNTER — Other Ambulatory Visit: Payer: Self-pay

## 2019-06-25 ENCOUNTER — Ambulatory Visit
Admission: RE | Admit: 2019-06-25 | Discharge: 2019-06-25 | Disposition: A | Discharge status: Home / Self Care | Payer: PPO | Source: Ambulatory Visit | Attending: Radiation Oncology | Admitting: Radiation Oncology

## 2019-06-25 DIAGNOSIS — C342 Malignant neoplasm of middle lobe, bronchus or lung: Secondary | ICD-10-CM | POA: Diagnosis not present

## 2019-06-25 DIAGNOSIS — Z510 Encounter for antineoplastic radiation therapy: Secondary | ICD-10-CM | POA: Diagnosis not present

## 2019-06-25 MED ORDER — FLUCONAZOLE 100 MG PO TABS: 100.0000 mg | ORAL_TABLET | Freq: Every day | ORAL | 0 refills | Status: AC

## 2019-06-28 ENCOUNTER — Ambulatory Visit
Admission: RE | Admit: 2019-06-28 | Discharge: 2019-06-28 | Disposition: A | Discharge status: Home / Self Care | Payer: PPO | Source: Ambulatory Visit | Attending: Radiation Oncology | Admitting: Radiation Oncology

## 2019-06-28 ENCOUNTER — Other Ambulatory Visit: Payer: Self-pay

## 2019-06-28 DIAGNOSIS — C342 Malignant neoplasm of middle lobe, bronchus or lung: Secondary | ICD-10-CM | POA: Diagnosis not present

## 2019-06-28 DIAGNOSIS — Z510 Encounter for antineoplastic radiation therapy: Secondary | ICD-10-CM | POA: Diagnosis not present

## 2019-06-29 ENCOUNTER — Encounter: Payer: Self-pay | Admitting: Radiation Oncology

## 2019-06-29 ENCOUNTER — Ambulatory Visit
Admission: RE | Admit: 2019-06-29 | Discharge: 2019-06-29 | Disposition: A | Discharge status: Home / Self Care | Payer: PPO | Source: Ambulatory Visit | Attending: Radiation Oncology | Admitting: Radiation Oncology

## 2019-06-29 ENCOUNTER — Other Ambulatory Visit: Payer: Self-pay

## 2019-06-29 DIAGNOSIS — C342 Malignant neoplasm of middle lobe, bronchus or lung: Secondary | ICD-10-CM | POA: Diagnosis not present

## 2019-06-29 DIAGNOSIS — Z510 Encounter for antineoplastic radiation therapy: Secondary | ICD-10-CM | POA: Diagnosis not present

## 2019-06-30 ENCOUNTER — Other Ambulatory Visit: Payer: PPO | Admitting: Hospice

## 2019-06-30 DIAGNOSIS — Z170 Estrogen receptor positive status [ER+]: Secondary | ICD-10-CM

## 2019-06-30 DIAGNOSIS — Z515 Encounter for palliative care: Secondary | ICD-10-CM

## 2019-06-30 NOTE — Progress Notes (Signed)
[facility] Consult Note Telephone: [phone]  Fax: [phone]  PATIENT NAME: [name] DOB: 27-Oct-1940 MRN: [id number]  PRIMARY CARE PROVIDER:   [name], MD  REFERRING PROVIDER:  [name], MD [address] [place],  [place] [address]  RESPONSIBLE PARTY:   Self Emergency contact/NOK: [name] [phone]    TELEHEALTH VISIT STATEMENT Due to the COVID-19 crisis, this visit was done via telephone from my office. It was initiated and consented to by this patient and/or family.  RECOMMENDATIONS/PLAN:   Advance Care Planning/Goals of Care: Telehealth Visit consisted of building trust and follow up on goals of care clarification and symptom relief. Patient's last radiation was yesterday; she said she does not want to do chemotheraoy but does have a follow up appointment with Oncologist in a month. She remains a full code. Goals of care include to maximize quality of life and symptom management. Symptom management: Ongoing weakness and decline in functional status related to LUNG CA with mets to liver and radiation therapy. PPS down from 50% to 40%. Patient also has Estrogen receptor positive breast cancer (s/p right mastectomy, left lumpectomy). Right shoulder pain improved since she received cortisone shot last month.She had diarrhea in the past week, none since yesterday., She was managing it with Imodium. Education provided to her and son [name] on the correct use of Imodium. They verbalized understanding and expressed thanks.  Follow up: Palliative care will continue to follow patient for goals of care clarification and symptom management. I spent 30  minutes providing this consultation, from. 12pm to 12.30pm  More than 50% of the time in this consultation was spent on coordinating communication.  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  CODE STATUS: Full  PPS: 40% HOSPICE ELIGIBILITY/DIAGNOSIS: TBD  PAST MEDICAL HISTORY:  Past Medical History:  Diagnosis Date  . Allergic rhinitis   . ASCVD (arteriosclerotic cardiovascular disease)   . Atherosclerosis of aorta ([place]) 2014   via CT AB/Pelvis- no AAA  . COPD (chronic obstructive pulmonary disease) ([place])   . DJD (degenerative joint disease)    neck, lower back, feet, hands  . Dyspnea   . Elevated hemoglobin A1c measurement   . Pain 03/2019   right chest/ breast area, worse with inspiration  . Tobacco abuse     SOCIAL HX:  Social History   Tobacco Use  . Smoking status: Current Every Day Smoker    Packs/day: 0.75    Years: 55.00    Pack years: 41.25    Types: Cigarettes  . Smokeless tobacco: Never Used  Substance Use Topics  . Alcohol use: No    ALLERGIES:  Allergies  Allergen Reactions  . Bisoprolol Other (See Comments)    Decreased Heart Rate  . Ace Inhibitors Cough  . Acyclovir And Related Rash and Other (See Comments)    Okay to take famvir  . Amoxil [Amoxicillin] Diarrhea  . Ciprofloxacin Diarrhea  . Crestor [Rosuvastatin] Other (See Comments)    Elevated LFT's and Myalgias  . Doxycycline Diarrhea  . Enalapril Cough  . Lipitor [Atorvastatin] Other (See Comments)    Elevated LFT's and Myalgias  . Metformin And Related Diarrhea  . Prednisone Other (See Comments)    Intolerant to High Dose  Prednisone  . Vicodin [Hydrocodone-Acetaminophen] Other (See Comments)    Increased Anxiety and Nervousness     PERTINENT MEDICATIONS:  Outpatient Encounter Medications as of 06/30/2019  Medication Sig  . acetaminophen (TYLENOL) 500 MG tablet Take 500 mg by mouth every 6 (six) hours as needed (takes capsule).  Marland Kitchen  albuterol (PROVENTIL HFA;VENTOLIN HFA) 108 (90 BASE) MCG/ACT inhaler Inhale 2 puffs into the lungs every 6 (six) hours as needed for wheezing or shortness of breath.  Marland Kitchen aspirin 81 MG tablet Take 81 mg by mouth daily.    . busPIRone (BUSPAR) 5 MG tablet Take 1 tablet (5 mg total) by mouth 3 (three) times daily. For anxiety. (Patient not taking: Reported on 06/08/2019)  . diclofenac sodium (VOLTAREN) 1 % GEL Apply 4 g topically 4 (four) times daily. (Patient not taking: Reported on 06/08/2019)  . escitalopram (LEXAPRO) 10 MG tablet Take 1 tablet (10 mg total) by mouth daily. (Patient not taking: Reported on 06/08/2019)  . fenofibrate micronized (LOFIBRA) 134 MG capsule Take 1 capsule by mouth once daily  . fish oil-omega-3 fatty acids 1000 MG capsule Take 1 capsule by mouth 3 (three) times daily.   . Flaxseed, Linseed, (FLAX SEED OIL) 1000 MG CAPS Take 1,000 mg by mouth 3 (three) times daily.   . fluconazole (DIFLUCAN) 100 MG tablet Take 1 tablet (100 mg total) by mouth daily. Take 2 tabs x 1, then 1 tab QD x 9 additional days.  . fluticasone (FLONASE) 50 MCG/ACT nasal spray Place 2 sprays into both nostrils daily. (Patient taking differently: Place 2 sprays into both nostrils as needed. )  . fluticasone furoate-vilanterol (BREO ELLIPTA) 100-25 MCG/INH AEPB Inhale 1 puff into the lungs daily. Rinse mouth with water after each use (Patient taking differently: Inhale 1 puff into the lungs as needed. Rinse mouth with water after each use)  . hydrochlorothiazide (HYDRODIURIL) 25 MG tablet Take 1 tablet daily for BP and fluid  . Magnesium 500 MG TABS Take 500 mg by mouth 2 (two) times daily.   . meloxicam (MOBIC) 15 MG tablet Take one daily with food for 2 weeks, can take with tylenol, can not take with aleve, iburpofen, then as needed daily for pain (Patient not taking: Reported on 06/08/2019)  . metFORMIN (GLUCOPHAGE-XR) 500 MG 24 hr tablet Take 2 tablets 2 x /day with meals for Diabetes  . naproxen sodium  (ALEVE) 220 MG tablet Take 220 mg by mouth.  . sucralfate (CARAFATE) 1 g tablet Take 1 tablet (1 g total) by mouth 4 (four) times daily. Take with glass of water, or dissolve in 4 oz of water. Take before each meal and prior to bedtime.  Marland Kitchen telmisartan (MICARDIS) 80 MG tablet Take 1 tablet by mouth once daily for blood pressure   No facility-administered encounter medications on file as of 06/30/2019.     PHYSICAL EXAM:   General: NAD, frail appearing, thin Cardiovascular: regular rate and rhythm Pulmonary: clear ant fields Abdomen: soft, nontender, + bowel sounds GU: no suprapubic tenderness Extremities: no edema, no joint deformities Skin: no rashes Neurological: Weakness but otherwise nonfocal  Teodoro Spray, NP

## 2019-08-01 ENCOUNTER — Encounter: Payer: Self-pay | Admitting: Internal Medicine

## 2019-08-01 NOTE — Progress Notes (Signed)
                                                                                                                                                                                             C  A  N  C  E  L  L  E  D                                This very nice 79 y.o. recently widowed  (01/17/2019) WF who  presents for 6 month follow up with HTN, ASCVD/ Lt. CVA & Rt. HP  HLD, T2_DMand Vitamin D Deficiency.       Patient has hx/o Breast Cancer with  Rt  Mastectomy (1990) & Lt Lumpectomy (2016) and more recently was dx'd in Sept with Small Cell Lung Cancer metastatic to with Liver. In November, patient declined and aggressive treatments. Patient is also followed by palliative care.       Patient is treated for HTN circa 1991 & BP has been controlled at home. Today's  . 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 Patient has had no complaints of any cardiac type chest pain, palpitations, dyspnea / orthopnea / PND, dizziness, claudication, or dependent edema.      Hyperlipidemia is controlled with diet & meds. Patient denies myalgias or other med SE's. 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  Last A1c was not at goal:  Lab Results  Component Value Date   HGBA1C 6.3 (H) 04/26/2019        Further, the patient also has history of  Vitamin D Deficiency ("12"/2009) and supplements vitamin D without any suspected  side-effects. Last vitamin D was still very low & not at goal:  Lab Results  Component Value Date   VD25OH 26 (L) 04/16/2018

## 2019-08-03 ENCOUNTER — Other Ambulatory Visit: Payer: Self-pay | Admitting: Radiation Oncology

## 2019-08-03 ENCOUNTER — Other Ambulatory Visit: Payer: PPO | Admitting: Hospice

## 2019-08-03 ENCOUNTER — Other Ambulatory Visit: Payer: Self-pay

## 2019-08-03 ENCOUNTER — Telehealth: Payer: Self-pay | Admitting: Radiation Oncology

## 2019-08-03 DIAGNOSIS — Z515 Encounter for palliative care: Secondary | ICD-10-CM

## 2019-08-03 DIAGNOSIS — C342 Malignant neoplasm of middle lobe, bronchus or lung: Secondary | ICD-10-CM

## 2019-08-03 DIAGNOSIS — C50312 Malignant neoplasm of lower-inner quadrant of left female breast: Secondary | ICD-10-CM

## 2019-08-03 NOTE — Telephone Encounter (Signed)
  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 During treatment she did very well with radiotherapy and did not have significant desquamation. She is breathing much better per her son's report but has had increasing confusion. She is not complaining of pain, or weakness. No other complaints are noted.   Impression/Plan: 1. 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 The patient sounds like she's doing better clinically. She still is not interested in any systemic therapy. She may be showing symptoms of brain disease, but at this time, the goals of her care per her son are to be comfort and prioritize quality of life rather than any further active treatment. I let him know to keep [place] informed if her goals shifted in which case we could order an MRI of the brain, but at this time he feels like she doesn't wish to leave the house and wants to remain at home and able to do the things she enjoys there. We will reach out to her palliative care agency [facility].  2. Goals of care. The patient's son is interested more discussion with [facility] as she is not planning any systemic therapy. I will send a message to the NP who saw her and place a referral for Hospice rather than palliative care that she started with.     Carola Rhine, PAC

## 2019-08-03 NOTE — Progress Notes (Signed)
[facility] Consult Note Telephone: [phone]  Fax: [phone]  PATIENT NAME: [name] DOB: April 10, 1941 MRN: [id number]  PRIMARY CARE PROVIDER:   [name], MD  REFERRING PROVIDER:  [name], MD [address] [place],  [place] [address]  RESPONSIBLE PARTY:Self Emergency contact/NOK: Manmeet Arzola [phone]    TELEHEALTH VISIT STATEMENT Due to the COVID-19 crisis, this visit was done via telephone from my office. It was initiated and consented to by this patient and/or family.  RECOMMENDATIONS/PLAN:  Advance Care Planning/Goals of Care: Telehealth Visit consisted of building trust andfollow up discussions with patient/son regarding message from Rocco Serene PA referring patient for Hospice services. Patient affirmed she no longer wants aggressive treatments- chemo, radiation; she desires to be full code but no intubation.Goals of care include to maximize quality of life and symptom management. When asked if patient would be open to Hospice services if eligible, son said family will further discuss and get back to NP by tomorrow. Therapeutic listening and ample emotional support provided.  Symptom management:Worsening  weakness and  decline in functional status related to LUNG CA with mets to liver PPS down from 50% to a weak 40% in the past month; some confusion noted per son.  Patient denied pain during visit. Son explained patient now has round the clock care provided by family members. Encouraged ongoing supportive care. Follow UR:[id number] care will continue to follow patient for goals of care clarification and symptom management. I spent 5minutes providing this consultation, from. 12pm to 12.30pm More than 50% of the time in this consultation was spent on coordinating communication. HISTORY OF PRESENT ILLNESS:Kim K Buckneris a 79 y.o.femalewith multiple medical  problems including degenerative joint disease,Estrogen receptor positive breast cancer (s/p right mastectomy, left lumpectomy), COPD, Small celllungcarcinoma with mets to the liver.Palliative Care was asked to help address goals of care. care.   CODE STATUS: Full  PPS: weak 40% HOSPICE ELIGIBILITY/DIAGNOSIS: TBD  PAST MEDICAL HISTORY:  Past Medical History:  Diagnosis Date  . Allergic rhinitis   . ASCVD (arteriosclerotic cardiovascular disease)   . Atherosclerosis of aorta ([place]) 2014   via CT AB/Pelvis- no AAA  . COPD (chronic obstructive pulmonary disease) ([place])   . DJD (degenerative joint disease)    neck, lower back, feet, hands  . Dyspnea   . Elevated hemoglobin A1c measurement   . Pain 03/2019   right chest/ breast area, worse with inspiration  . Tobacco abuse     SOCIAL HX:  Social History   Tobacco Use  . Smoking status: Current Every Day Smoker    Packs/day: 0.75    Years: 55.00    Pack years: 41.25    Types: Cigarettes  . Smokeless tobacco: Never Used  Substance Use Topics  . Alcohol use: No    ALLERGIES:  Allergies  Allergen Reactions  . Bisoprolol Other (See Comments)    Decreased Heart Rate  . Ace Inhibitors Cough  . Acyclovir And Related Rash and Other (See Comments)    Okay to take famvir  . Amoxil [Amoxicillin] Diarrhea  . Ciprofloxacin Diarrhea  . Crestor [Rosuvastatin] Other (See Comments)    Elevated LFT's and Myalgias  . Doxycycline Diarrhea  . Enalapril Cough  . Lipitor [Atorvastatin] Other (See Comments)    Elevated LFT's and Myalgias  . Metformin And Related Diarrhea  . Prednisone Other (See Comments)    Intolerant to High Dose Prednisone  . Vicodin [Hydrocodone-Acetaminophen] Other (See  Comments)    Increased Anxiety and Nervousness     PERTINENT MEDICATIONS:  Outpatient Encounter Medications as of 08/03/2019  Medication Sig  . acetaminophen (TYLENOL) 500 MG tablet Take 500 mg by mouth every 6 (six) hours as needed (takes  capsule).  Marland Kitchen albuterol (PROVENTIL HFA;VENTOLIN HFA) 108 (90 BASE) MCG/ACT inhaler Inhale 2 puffs into the lungs every 6 (six) hours as needed for wheezing or shortness of breath.  Marland Kitchen aspirin 81 MG tablet Take 81 mg by mouth daily.    . busPIRone (BUSPAR) 5 MG tablet Take 1 tablet (5 mg total) by mouth 3 (three) times daily. For anxiety. (Patient not taking: Reported on 06/08/2019)  . diclofenac sodium (VOLTAREN) 1 % GEL Apply 4 g topically 4 (four) times daily. (Patient not taking: Reported on 06/08/2019)  . escitalopram (LEXAPRO) 10 MG tablet Take 1 tablet (10 mg total) by mouth daily. (Patient not taking: Reported on 06/08/2019)  . fenofibrate micronized (LOFIBRA) 134 MG capsule Take 1 capsule by mouth once daily  . fish oil-omega-3 fatty acids 1000 MG capsule Take 1 capsule by mouth 3 (three) times daily.   . Flaxseed, Linseed, (FLAX SEED OIL) 1000 MG CAPS Take 1,000 mg by mouth 3 (three) times daily.   . fluconazole (DIFLUCAN) 100 MG tablet Take 1 tablet (100 mg total) by mouth daily. Take 2 tabs x 1, then 1 tab QD x 9 additional days.  . fluticasone (FLONASE) 50 MCG/ACT nasal spray Place 2 sprays into both nostrils daily. (Patient taking differently: Place 2 sprays into both nostrils as needed. )  . fluticasone furoate-vilanterol (BREO ELLIPTA) 100-25 MCG/INH AEPB Inhale 1 puff into the lungs daily. Rinse mouth with water after each use (Patient taking differently: Inhale 1 puff into the lungs as needed. Rinse mouth with water after each use)  . hydrochlorothiazide (HYDRODIURIL) 25 MG tablet Take 1 tablet daily for BP and fluid  . Magnesium 500 MG TABS Take 500 mg by mouth 2 (two) times daily.   . meloxicam (MOBIC) 15 MG tablet Take one daily with food for 2 weeks, can take with tylenol, can not take with aleve, iburpofen, then as needed daily for pain (Patient not taking: Reported on 06/08/2019)  . metFORMIN (GLUCOPHAGE-XR) 500 MG 24 hr tablet Take 2 tablets 2 x /day with meals for Diabetes  .  naproxen sodium (ALEVE) 220 MG tablet Take 220 mg by mouth.  . sucralfate (CARAFATE) 1 g tablet Take 1 tablet (1 g total) by mouth 4 (four) times daily. Take with glass of water, or dissolve in 4 oz of water. Take before each meal and prior to bedtime.  Marland Kitchen telmisartan (MICARDIS) 80 MG tablet Take 1 tablet by mouth once daily for blood pressure   No facility-administered encounter medications on file as of 08/03/2019.    Teodoro Spray, NP

## 2019-08-04 ENCOUNTER — Ambulatory Visit: Payer: PPO | Admitting: Internal Medicine

## 2019-08-05 ENCOUNTER — Telehealth: Payer: Self-pay | Admitting: Hospice

## 2019-08-05 DIAGNOSIS — Z7189 Other specified counseling: Secondary | ICD-10-CM | POA: Diagnosis not present

## 2019-08-05 DIAGNOSIS — Z515 Encounter for palliative care: Secondary | ICD-10-CM | POA: Diagnosis not present

## 2019-08-05 NOTE — Telephone Encounter (Signed)
Follow up discussion with [name] on patient's decision on Hospice services. Patient said no to hospice services at this time. Family continue to provide round the clock care to patient to ensure her comfort. Therapeutic listening, validation and ample emotional support provided. Billy agreeable to communicate any need or change in decision

## 2019-08-06 ENCOUNTER — Telehealth: Payer: Self-pay | Admitting: *Deleted

## 2019-08-06 NOTE — Telephone Encounter (Signed)
This RN was contacted by Delsa Sale with [facility] inquiring about ok to transfer from Palliative to Hospice.   This RN informed her MD ok with above.

## 2019-08-11 NOTE — Telephone Encounter (Signed)
No entry 

## 2019-08-18 ENCOUNTER — Other Ambulatory Visit: Payer: Self-pay | Admitting: Internal Medicine

## 2019-08-18 ENCOUNTER — Telehealth: Payer: Self-pay | Admitting: *Deleted

## 2019-08-18 DIAGNOSIS — Z170 Estrogen receptor positive status [ER+]: Secondary | ICD-10-CM

## 2019-08-18 DIAGNOSIS — N390 Urinary tract infection, site not specified: Secondary | ICD-10-CM

## 2019-08-18 DIAGNOSIS — C3490 Malignant neoplasm of unspecified part of unspecified bronchus or lung: Secondary | ICD-10-CM

## 2019-08-18 DIAGNOSIS — C50312 Malignant neoplasm of lower-inner quadrant of left female breast: Secondary | ICD-10-CM

## 2019-08-18 DIAGNOSIS — H548 Legal blindness, as defined in USA: Secondary | ICD-10-CM

## 2019-08-18 NOTE — Telephone Encounter (Signed)
Son called and reported the patient is having UTI symptoms and asked to bring a urine sample in, since the patient is unable to walk and is confused at times.  Ok for a lab only to UA and culture, per Dr Melford Aase. Son also requested an evaluation for home health to visit patient.

## 2019-08-19 ENCOUNTER — Other Ambulatory Visit: Payer: PPO | Admitting: Hospice

## 2019-08-19 ENCOUNTER — Other Ambulatory Visit: Payer: Self-pay

## 2019-08-19 DIAGNOSIS — Z515 Encounter for palliative care: Secondary | ICD-10-CM | POA: Diagnosis not present

## 2019-08-19 DIAGNOSIS — C342 Malignant neoplasm of middle lobe, bronchus or lung: Secondary | ICD-10-CM

## 2019-08-19 NOTE — Progress Notes (Signed)
[facility] Pall[facility] Consult Note Telephone: [phone]  Fax: [phone]  PATIENT NAME: [name] DOB: 1941-04-09 MRN: [id number]  PRIMARY CARE PROVIDER:   [name], MD  REFERRING PROVIDER:  [name], MD [address] [place],   [address]  RESPONSIBLE PARTY:Self Emergency contact/NOK: Dannielle Baskins [phone]   TELEHEALTH VISIT STATEMENT Due to the COVID-19 crisis, this visit was done via telephone from my office. It was initiated and consented to by this patient and/or family.  RECOMMENDATIONS/PLAN:  Advance Care Planning/Goals of Care: Telehealth Visit consisted of building trust andfollow up on palliative care. Patient continues to decline, more forgetful, confused and no longer able to ambulate.  Rocco Serene PA had referred patient for Hospice services; patient and family not open to Hospice services at this time. [name] said 4 of his uncles died of CA and had Hospice service and that patient and family has come to associate Hospice with quick death. Ample education provided on the benefits of Hospice services; assuring [name] that Palliative with facilitate easy transition if and when family decides to utilize Hospice service. He was agreeable.  He endorsed PCP had ordered home health services and that [facility] has contacted him to commence service. Patient remains a full code but no intubation. Symptom management:Increasing confusion and occasional hallucination. Urine sample collected to send to PCP office. Worsening weakness and  decline in functional status related to LUNG CA with mets to liver PPS down from 50% to a 30% in the past month. [facility] on board. Encouraged ongoing supportive care. Follow WE:[id number] care will continue to follow patient for goals of care clarification and symptom management. I spent65minutes providing this  consultation.More than 50% of the time in this consultation was spent on coordinating communication. 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 care.  CODE STATUS: Full  PPS: 30% HOSPICE ELIGIBILITY/DIAGNOSIS: TBD  PAST MEDICAL HISTORY:  Past Medical History:  Diagnosis Date  . Allergic rhinitis   . ASCVD (arteriosclerotic cardiovascular disease)   . Atherosclerosis of aorta ([place]) 2014   via CT AB/Pelvis- no AAA  . COPD (chronic obstructive pulmonary disease) ([place])   . DJD (degenerative joint disease)    neck, lower back, feet, hands  . Dyspnea   . Elevated hemoglobin A1c measurement   . Pain 03/2019   right chest/ breast area, worse with inspiration  . Tobacco abuse     SOCIAL HX:  Social History   Tobacco Use  . Smoking status: Current Every Day Smoker    Packs/day: 0.75    Years: 55.00    Pack years: 41.25    Types: Cigarettes  . Smokeless tobacco: Never Used  Substance Use Topics  . Alcohol use: No    ALLERGIES:  Allergies  Allergen Reactions  . Bisoprolol Other (See Comments)    Decreased Heart Rate  . Ace Inhibitors Cough  . Acyclovir And Related Rash and Other (See Comments)    Okay to take famvir  . Amoxil [Amoxicillin] Diarrhea  . Ciprofloxacin Diarrhea  . Crestor [Rosuvastatin] Other (See Comments)    Elevated LFT's and Myalgias  . Doxycycline Diarrhea  . Enalapril Cough  . Lipitor [Atorvastatin] Other (See Comments)    Elevated LFT's and Myalgias  .  Metformin And Related Diarrhea  . Prednisone Other (See Comments)    Intolerant to High Dose Prednisone  . Vicodin [Hydrocodone-Acetaminophen] Other (See Comments)    Increased Anxiety and Nervousness     PERTINENT  MEDICATIONS:  Outpatient Encounter Medications as of 08/19/2019  Medication Sig  . acetaminophen (TYLENOL) 500 MG tablet Take 500 mg by mouth every 6 (six) hours as needed (takes capsule).  Marland Kitchen albuterol (PROVENTIL HFA;VENTOLIN HFA) 108 (90 BASE) MCG/ACT inhaler Inhale 2 puffs into the lungs every 6 (six) hours as needed for wheezing or shortness of breath.  Marland Kitchen aspirin 81 MG tablet Take 81 mg by mouth daily.    . fenofibrate micronized (LOFIBRA) 134 MG capsule Take 1 capsule by mouth once daily  . fish oil-omega-3 fatty acids 1000 MG capsule Take 1 capsule by mouth 3 (three) times daily.   . Flaxseed, Linseed, (FLAX SEED OIL) 1000 MG CAPS Take 1,000 mg by mouth 3 (three) times daily.   . fluconazole (DIFLUCAN) 100 MG tablet Take 1 tablet (100 mg total) by mouth daily. Take 2 tabs x 1, then 1 tab QD x 9 additional days.  . fluticasone (FLONASE) 50 MCG/ACT nasal spray Place 2 sprays into both nostrils daily. (Patient taking differently: Place 2 sprays into both nostrils as needed. )  . fluticasone furoate-vilanterol (BREO ELLIPTA) 100-25 MCG/INH AEPB Inhale 1 puff into the lungs daily. Rinse mouth with water after each use (Patient taking differently: Inhale 1 puff into the lungs as needed. Rinse mouth with water after each use)  . hydrochlorothiazide (HYDRODIURIL) 25 MG tablet Take 1 tablet daily for BP and fluid  . Magnesium 500 MG TABS Take 500 mg by mouth 2 (two) times daily.   . metFORMIN (GLUCOPHAGE-XR) 500 MG 24 hr tablet Take 2 tablets 2 x /day with meals for Diabetes  . naproxen sodium (ALEVE) 220 MG tablet Take 220 mg by mouth.  . sucralfate (CARAFATE) 1 g tablet Take 1 tablet (1 g total) by mouth 4 (four) times daily. Take with glass of water, or dissolve in 4 oz of water. Take before each meal and prior to bedtime.  Marland Kitchen telmisartan (MICARDIS) 80 MG tablet Take 1 tablet by mouth once daily for blood pressure   No facility-administered encounter medications on file as of 08/19/2019.      Teodoro Spray, NP

## 2019-08-23 DIAGNOSIS — I69351 Hemiplegia and hemiparesis following cerebral infarction affecting right dominant side: Secondary | ICD-10-CM | POA: Diagnosis not present

## 2019-08-23 DIAGNOSIS — L89320 Pressure ulcer of left buttock, unstageable: Secondary | ICD-10-CM | POA: Diagnosis not present

## 2019-08-23 DIAGNOSIS — C787 Secondary malignant neoplasm of liver and intrahepatic bile duct: Secondary | ICD-10-CM | POA: Diagnosis not present

## 2019-08-23 DIAGNOSIS — L89312 Pressure ulcer of right buttock, stage 2: Secondary | ICD-10-CM | POA: Diagnosis not present

## 2019-08-23 DIAGNOSIS — H548 Legal blindness, as defined in USA: Secondary | ICD-10-CM | POA: Diagnosis not present

## 2019-08-23 DIAGNOSIS — I2510 Atherosclerotic heart disease of native coronary artery without angina pectoris: Secondary | ICD-10-CM | POA: Diagnosis not present

## 2019-08-23 DIAGNOSIS — N390 Urinary tract infection, site not specified: Secondary | ICD-10-CM | POA: Diagnosis not present

## 2019-08-23 DIAGNOSIS — S31821D Laceration without foreign body of left buttock, subsequent encounter: Secondary | ICD-10-CM | POA: Diagnosis not present

## 2019-08-23 DIAGNOSIS — C3490 Malignant neoplasm of unspecified part of unspecified bronchus or lung: Secondary | ICD-10-CM | POA: Diagnosis not present

## 2019-08-23 DIAGNOSIS — I10 Essential (primary) hypertension: Secondary | ICD-10-CM | POA: Diagnosis not present

## 2019-08-23 DIAGNOSIS — I824Z2 Acute embolism and thrombosis of unspecified deep veins of left distal lower extremity: Secondary | ICD-10-CM | POA: Diagnosis not present

## 2019-08-23 DIAGNOSIS — L89152 Pressure ulcer of sacral region, stage 2: Secondary | ICD-10-CM | POA: Diagnosis not present

## 2019-08-23 DIAGNOSIS — L89150 Pressure ulcer of sacral region, unstageable: Secondary | ICD-10-CM | POA: Diagnosis not present

## 2019-08-23 DIAGNOSIS — E119 Type 2 diabetes mellitus without complications: Secondary | ICD-10-CM | POA: Diagnosis not present

## 2019-08-26 ENCOUNTER — Telehealth: Payer: Self-pay | Admitting: *Deleted

## 2019-08-26 ENCOUNTER — Other Ambulatory Visit: Payer: Self-pay | Admitting: Internal Medicine

## 2019-08-26 MED ORDER — NITROFURANTOIN MONOHYD MACRO 100 MG PO CAPS: ORAL_CAPSULE | ORAL | 0 refills | Status: AC

## 2019-08-26 NOTE — Telephone Encounter (Signed)
Family member advised an RX for the patient to treat a UTI has been sent to her pharmacy, per Dr Melford Aase.

## 2019-08-30 ENCOUNTER — Telehealth: Payer: Self-pay | Admitting: *Deleted

## 2019-08-30 NOTE — Telephone Encounter (Signed)
Amy from [facility] called and reported the patient's O2 sat was 87 % and BP low.  The patient has a decubitus ulcer on her coccyx that has worsened since her last home visit. The nurse advised the family to take her to the ED and may need a referral for Hospice. She reported the patient and her family were going to discuss the options. Dr Melford Aase is aware.

## 2019-08-31 ENCOUNTER — Telehealth: Payer: Self-pay | Admitting: *Deleted

## 2019-08-31 ENCOUNTER — Encounter: Payer: Self-pay | Admitting: Internal Medicine

## 2019-08-31 MED ORDER — LORAZEPAM 0.5 MG PO TABS: 0.5000 mg | ORAL_TABLET | Freq: Two times a day (BID) | ORAL | 0 refills | Status: AC | PRN

## 2019-08-31 NOTE — Telephone Encounter (Signed)
Newman Nickels, RN from [facility] called for orders. Reports pt's O2 sats are 88% and they are requesting O2 2 liters.    Patient has taken Ativan 0.5mg  prn anxiety (they were her husband's pills) . She states they have been very helpful and would appreciate a new prescription.

## 2019-08-31 NOTE — Telephone Encounter (Signed)
Verbal given for home O2. Prescription for ativan called into pharmacy.  Pam notified

## 2019-09-09 NOTE — Progress Notes (Signed)
  Radiation Oncology         [phone] ________________________________  Name: [name] MRN: [id number]  Date: 06/29/2019  DOB: [birth year]  End of Treatment Note  Diagnosis:   Extensive stage small cell lung cancer     Indication for treatment::  palliative       Radiation treatment dates:   06/14/19 - 06/29/19  Site/dose:   The mediastinum was treated to a dose of 30 Gy in 10 fractions using a 3-field 3D conformal technique.  Narrative: The patient tolerated radiation treatment relatively well.     Plan: The patient has completed radiation treatment. The patient will return to radiation oncology clinic for routine followup in one month. I advised the patient to call or return sooner if they have any questions or concerns related to their recovery or treatment. ________________________________  Jodelle Gross, M.D., Ph.D.

## 2019-09-10 ENCOUNTER — Other Ambulatory Visit: Payer: Self-pay | Admitting: Oncology

## 2019-09-10 NOTE — Progress Notes (Signed)
I called [name] and spoke with her and her son regarding the CT scan.  Recall that she has refused all treatment.  She is currently under hospice care.  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  I will be glad to arrange that for her.  Of note her shoulder pain is much better after cortisone shot given by orthopedics.

## 2019-09-15 ENCOUNTER — Other Ambulatory Visit: Payer: Self-pay | Admitting: Oncology

## 2019-09-20 DEATH — deceased

## 2019-09-23 NOTE — Progress Notes (Signed)
  Radiation Oncology         [phone] ________________________________  Name: [name] MRN: [id number]  Date: 06/09/2019  DOB: March 26, 1941  SIMULATION AND TREATMENT PLANNING NOTE  DIAGNOSIS:     ICD-10-CM   1. Small cell lung cancer, right middle lobe ([place])  C34.2      Site:  chest  NARRATIVE:  The patient was brought to the [facility].  Identity was confirmed.  All relevant records and images related to the planned course of therapy were reviewed.   Written consent to proceed with treatment was confirmed which was freely given after reviewing the details related to the planned course of therapy had been reviewed with the patient.  Then, the patient was set-up in a stable reproducible  supine position for radiation therapy.  CT images were obtained.  Surface markings were placed.    Medically necessary complex treatment device(s) for immobilization:  Customized vac-lock bag.   The CT images were loaded into the planning software.  Then the target and avoidance structures were contoured.  Treatment planning then occurred.  The radiation prescription was entered and confirmed.  A total of 2 complex treatment devices were fabricated which relate to the designed radiation treatment fields. Additional reduced fields will be used as necessary to improve the dose homogeneity of the plan. Each of these customized fields/ complex treatment devices will be used on a daily basis during the radiation course. I have requested : 3D Simulation  I have requested a DVH of the following structures: target volume, spinal cord, lungs, heart.   The patient will undergo daily image guidance to ensure accurate localization of the target, and adequate minimize dose to the normal surrounding structures in close proximity to the target.   PLAN:  The patient will receive 30 Gy in 10 fractions.    ________________________________   Jodelle Gross, MD, PhD

## 2019-11-15 ENCOUNTER — Ambulatory Visit: Payer: PPO | Admitting: Adult Health

## 2020-01-18 ENCOUNTER — Encounter: Payer: PPO | Admitting: Internal Medicine

## 2020-05-08 ENCOUNTER — Encounter: Payer: PPO | Admitting: Internal Medicine

## 2021-07-03 IMAGING — CR CHEST - 2 VIEW
2 series · 2 of 2 positions shown · non-contrast
Comparison: 09/14/2018

CLINICAL DATA: Right upper quadrant pain, shortness of breath

EXAM:
CHEST - 2 VIEW

[w chest pa]
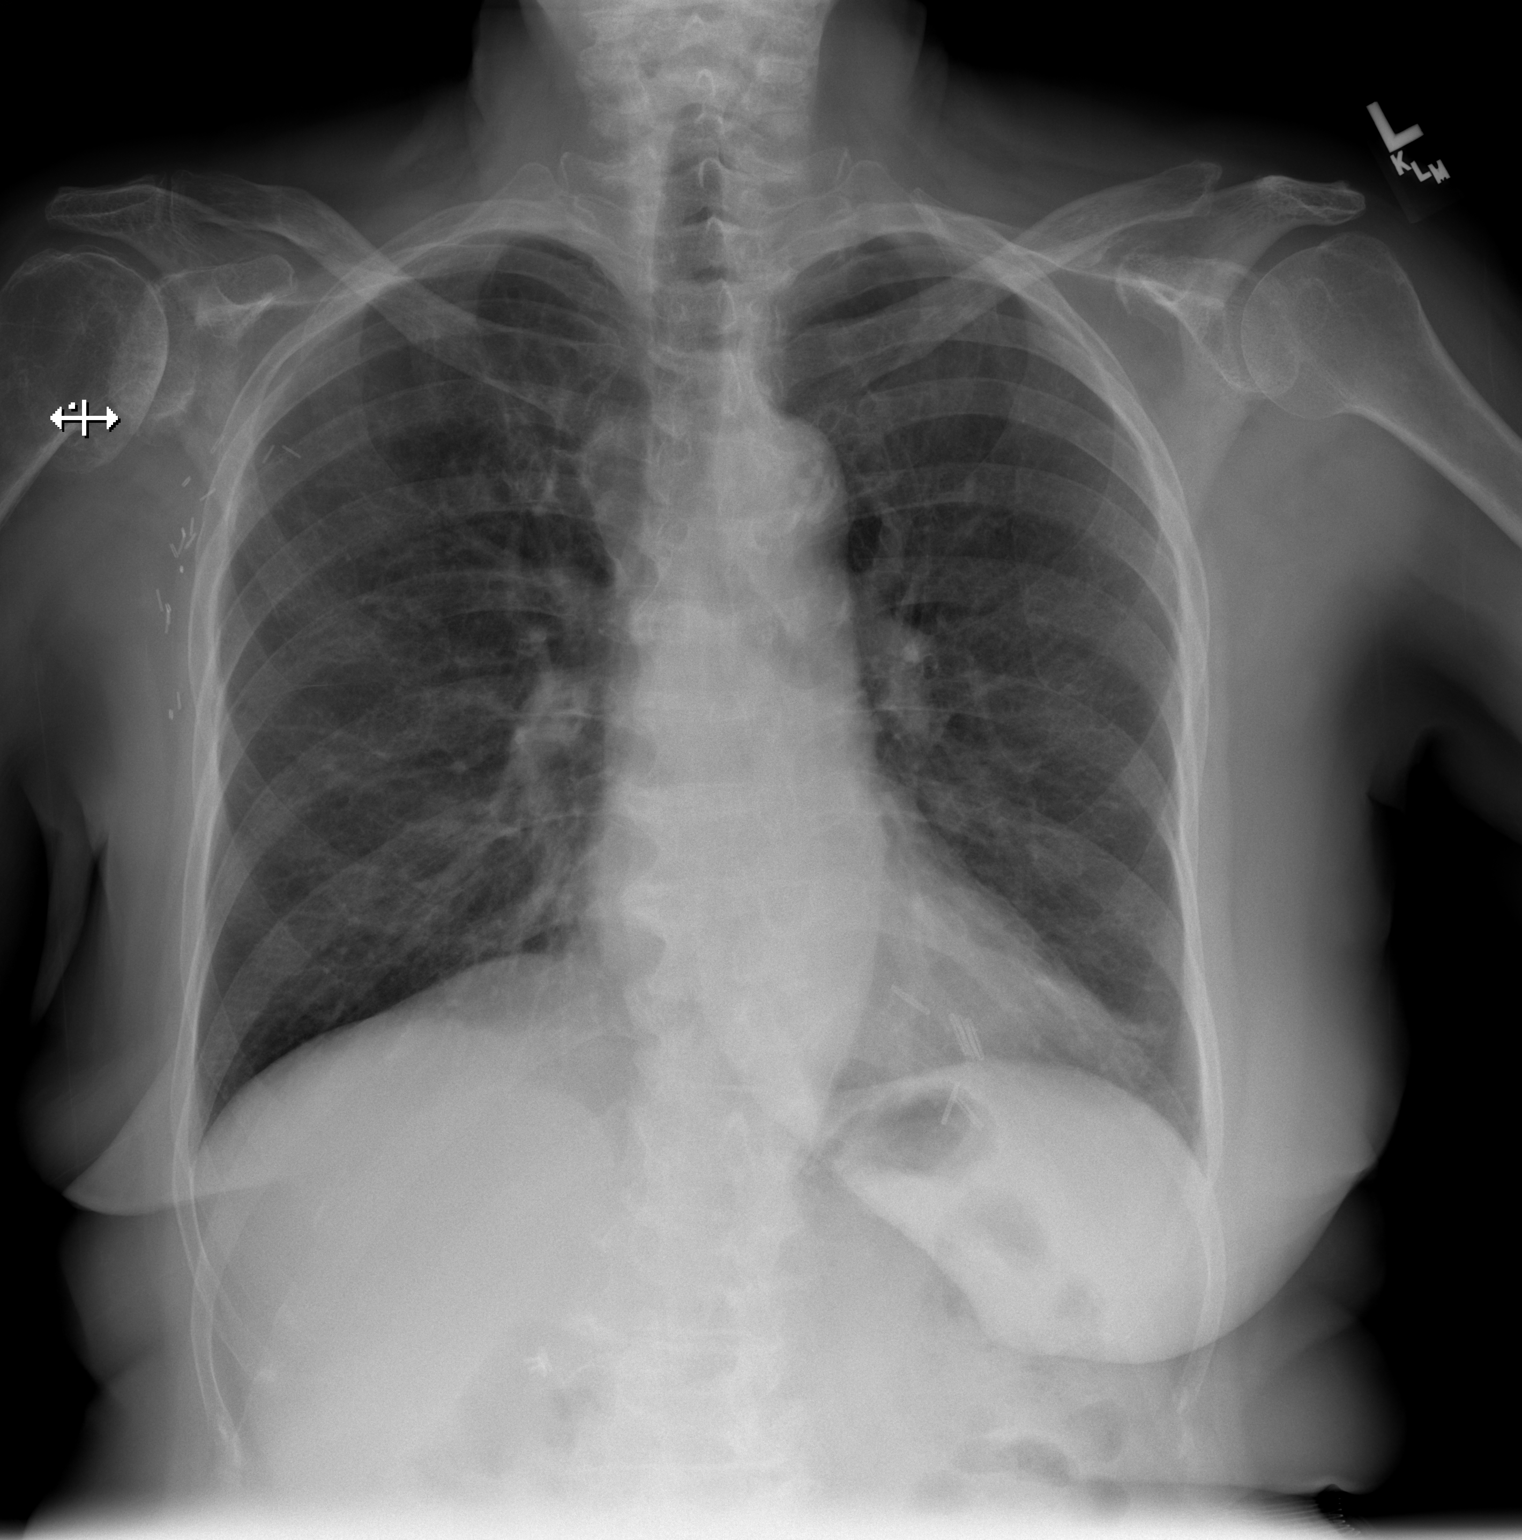

[w chest lat]
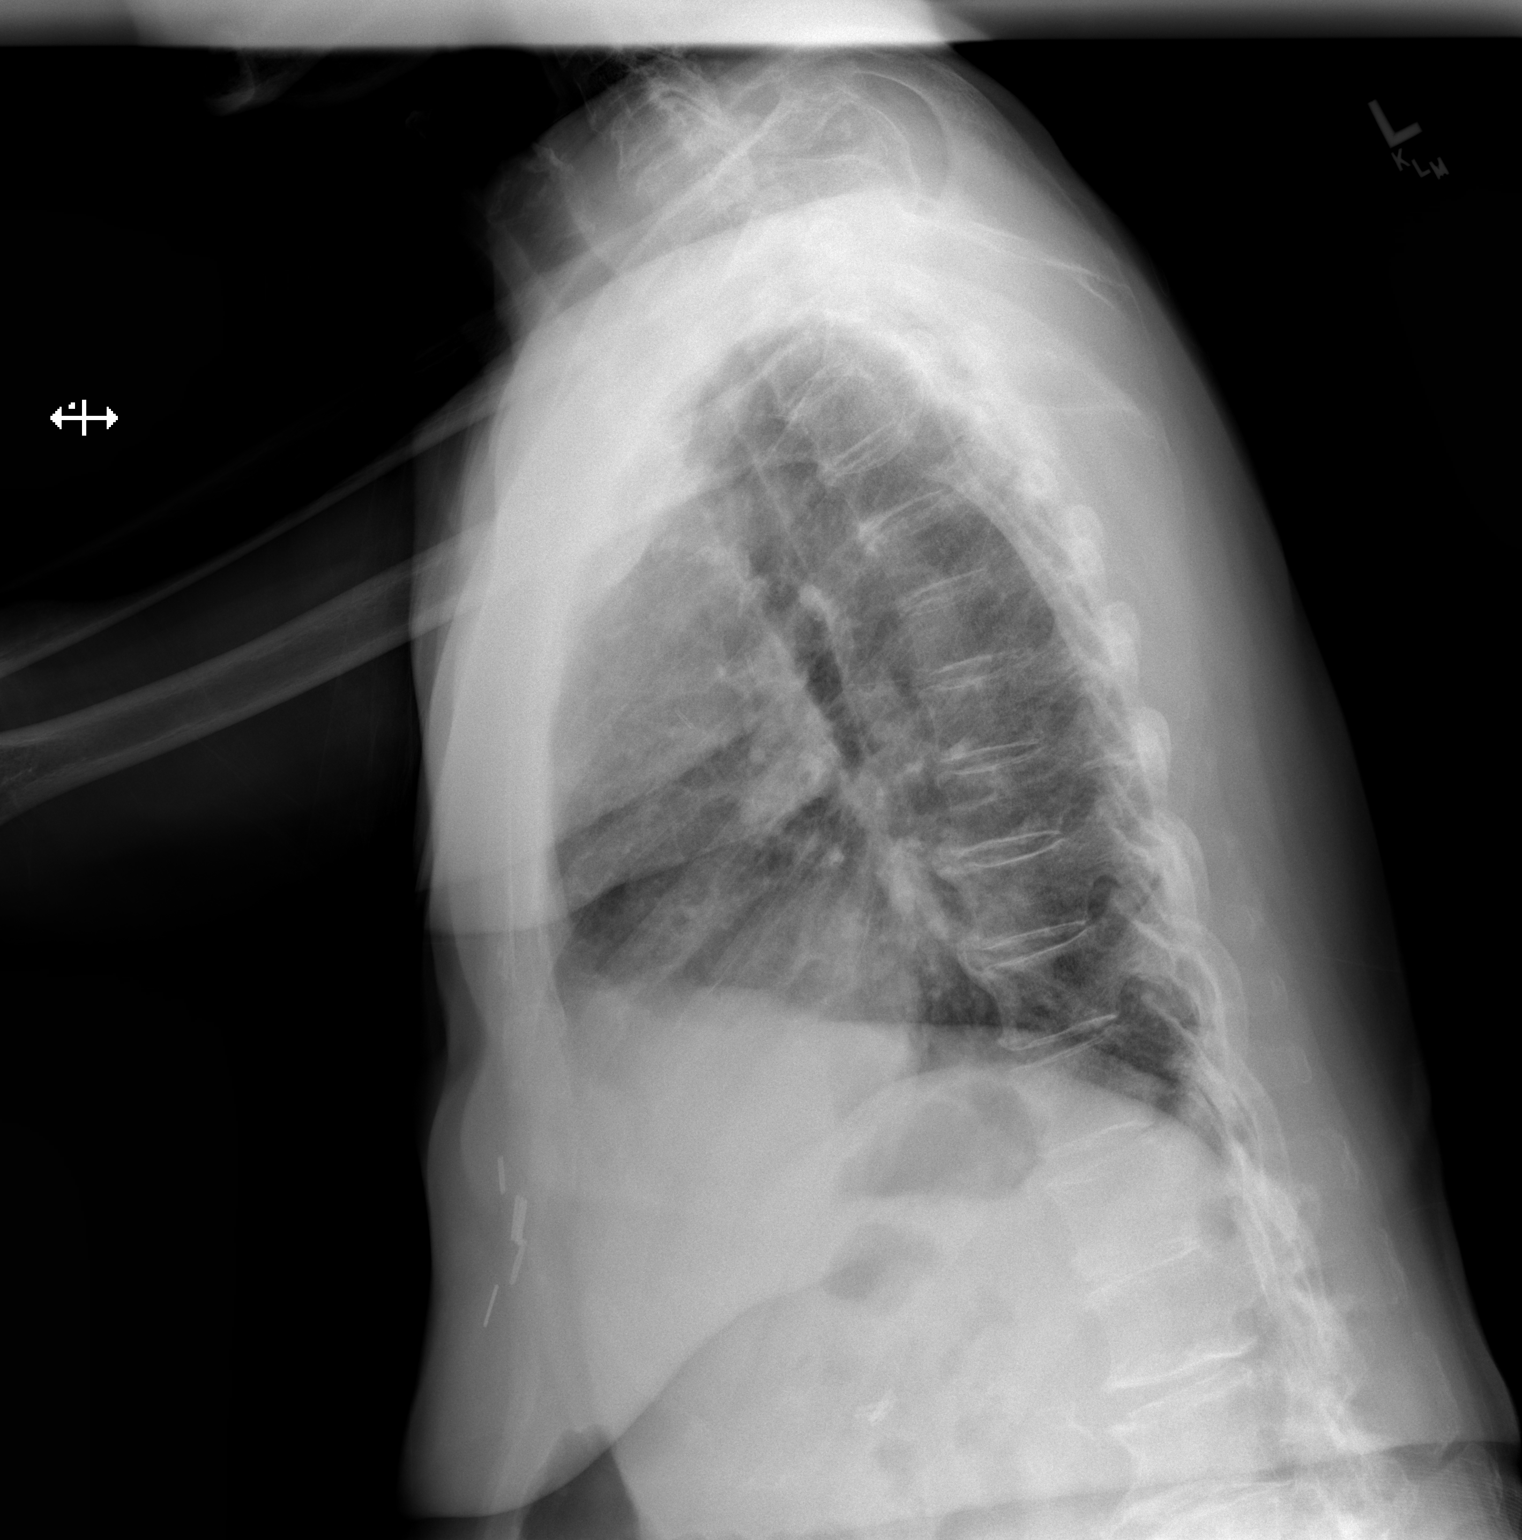

[2 of 2 positions shown; findings below may reference images not displayed]

FINDINGS: Heart and mediastinal contours are within normal limits. No focal
opacities or effusions. No acute bony abnormality. Aortic
atherosclerosis.
IMPRESSION: No active cardiopulmonary disease.

## 2021-09-12 IMAGING — MR MR SHOULDER*R* WO/W CM
8 series · 38 of 40 positions shown · IV contrast (gadavist)
Comparison: None.

CLINICAL DATA: Severe right shoulder pain. History of breast
cancer.

EXAM:
MRI OF THE RIGHT SHOULDER WITHOUT AND WITH CONTRAST
TECHNIQUE: Multiplanar, multisequence MR imaging of the right shoulder was
performed before and after the administration of intravenous
contrast.
CONTRAST:  6mL GADAVIST GADOBUTROL 1 MMOL/ML IV SOLN

[Series 5: PD fat-sat · axial · right · 4.0mm · 0.36mm/px · z∈[-43,+66]mm · 5 of 25 slices shown (1 of 2)]
[im 1/25]
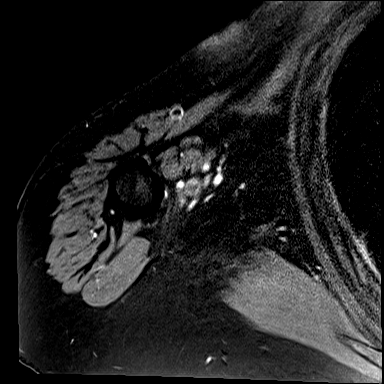
[im 7/25]
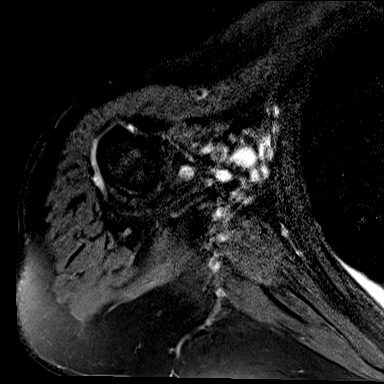
[im 13/25]
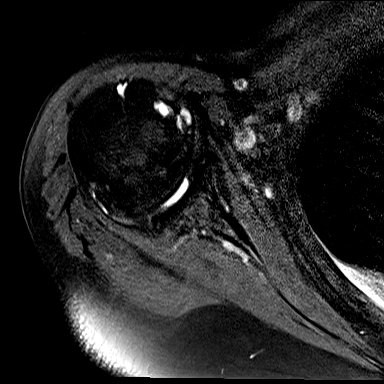
[im 19/25]
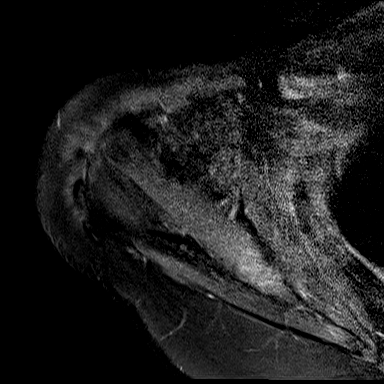
[im 25/25]
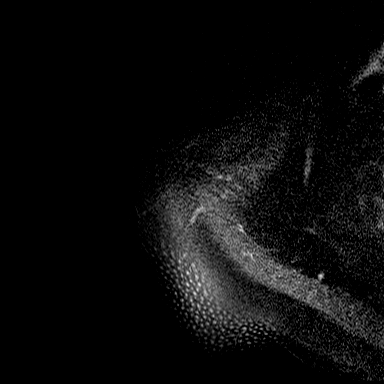

[Series 6: T2 fat-sat · oblique · right · 4.0mm · 0.44mm/px · 5 of 26 slices shown (1 of 2)]
[im 1/26]
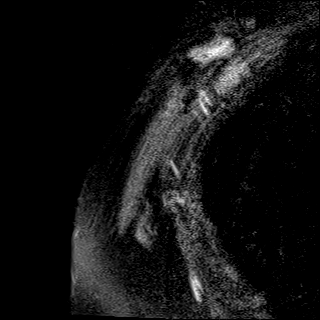
[im 7/26]
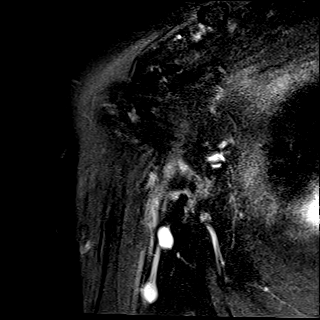
[im 13/26]
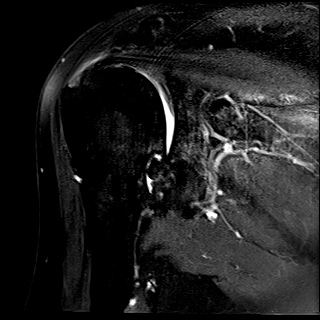
[im 19/26]
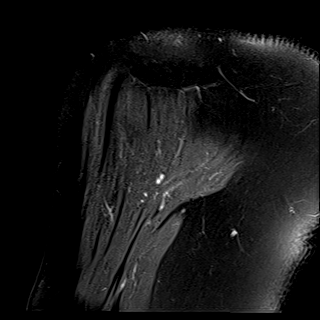
[im 26/26]
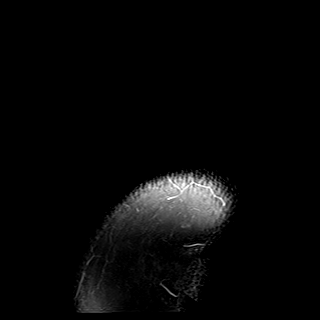

[Series 7: PD fat-sat · oblique · right · 4.0mm · 0.39mm/px · 5 of 26 slices shown (2 of 2)]
[im 1/26]
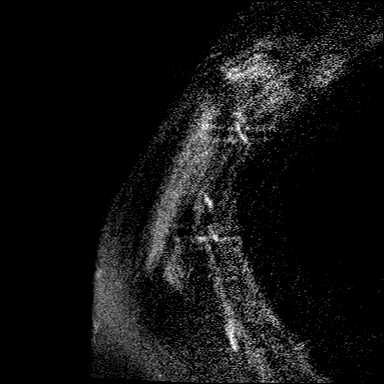
[im 7/26]
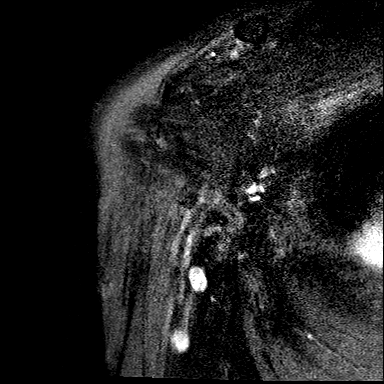
[im 13/26]
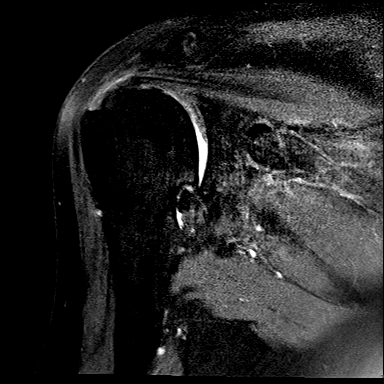
[im 19/26]
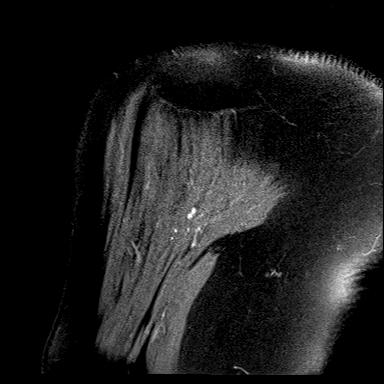
[im 26/26]
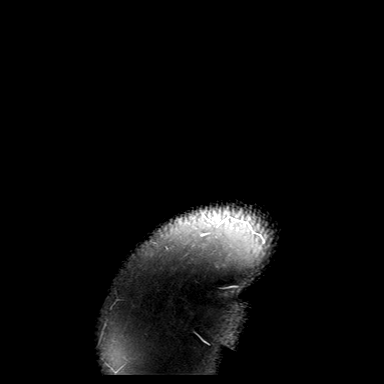

[Series 8: T2 fat-sat · oblique · right · 4.0mm · 0.44mm/px · 5 of 25 slices shown (2 of 2)]
[im 1/25]
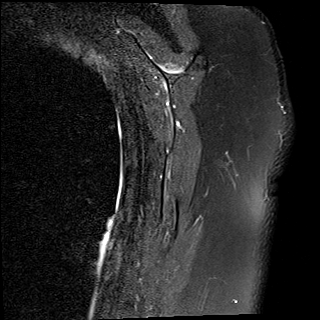
[im 7/25]
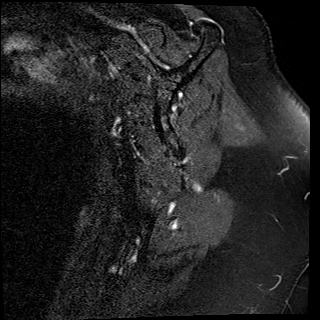
[im 13/25]
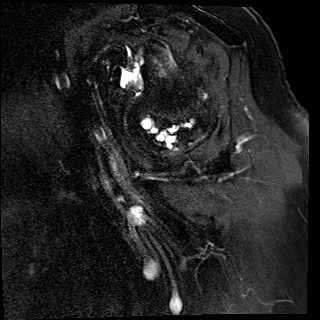
[im 19/25]
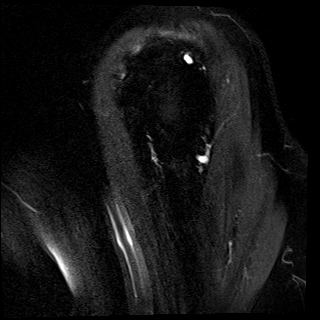
[im 25/25]
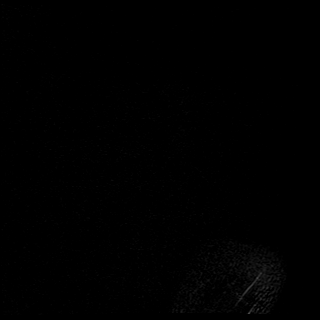

[Series 9: T1 · oblique · right · 4.0mm · 0.36mm/px · 5 of 25 slices shown]
[im 1/25]
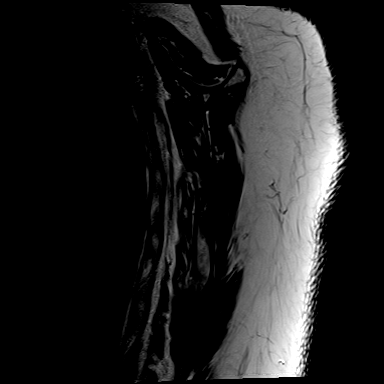
[im 7/25]
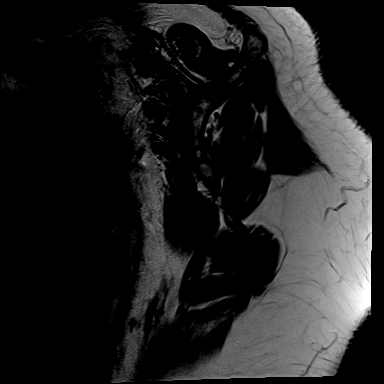
[im 13/25]
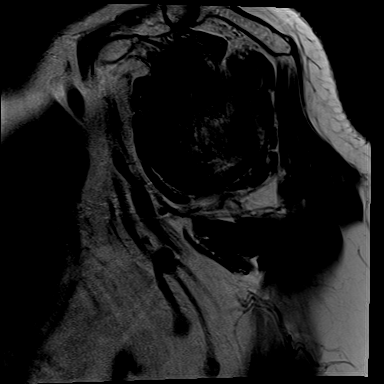
[im 19/25]
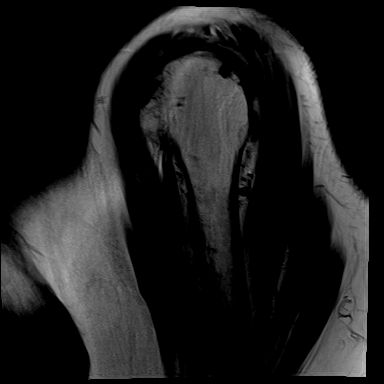
[im 25/25]
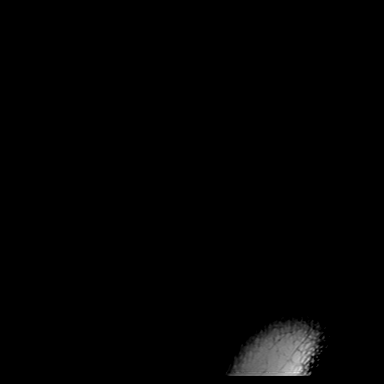

[Series 10: T1 fat-sat · axial · non-contrast · right · 4.0mm · 0.55mm/px · z∈[-43,+66]mm · 5 of 25 slices shown]
[im 1/25]
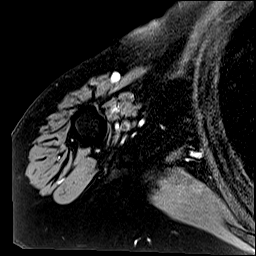
[im 7/25]
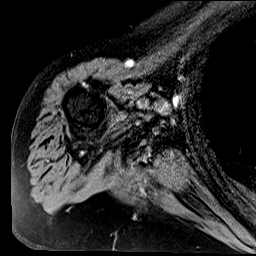
[im 13/25]
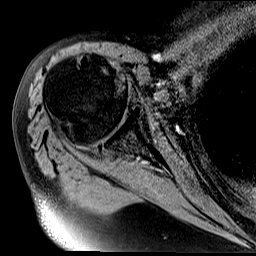
[im 19/25]
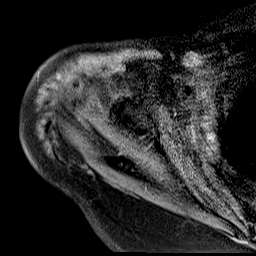
[im 25/25]
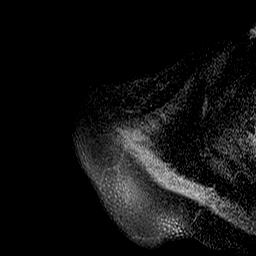

[Series 11: T1 fat-sat post-contrast · axial · right · 4.0mm · 0.55mm/px · z∈[-43,+66]mm · 5 of 25 slices shown (1 of 2)]
[im 1/25]
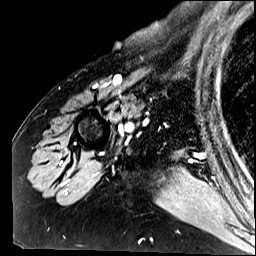
[im 7/25]
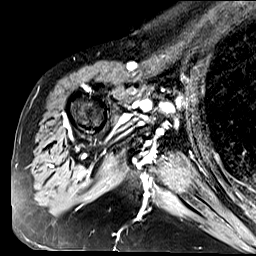
[im 13/25]
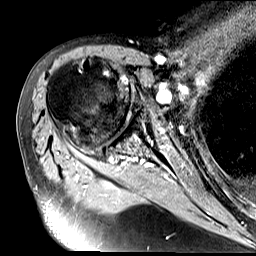
[im 19/25]
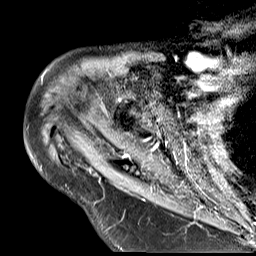
[im 25/25]
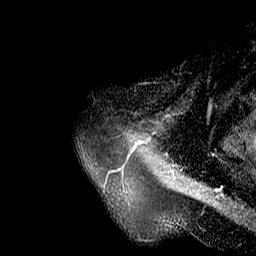

[Series 12: T1 fat-sat post-contrast · oblique · right · 4.0mm · 0.27mm/px · 3 of 26 slices shown (2 of 2)]
[im 1/26]
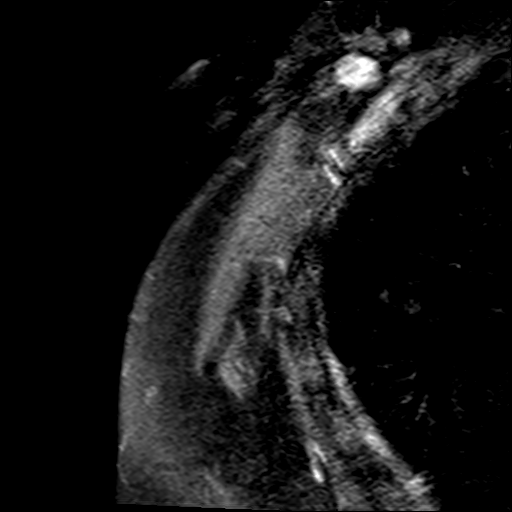
[im 7/26]
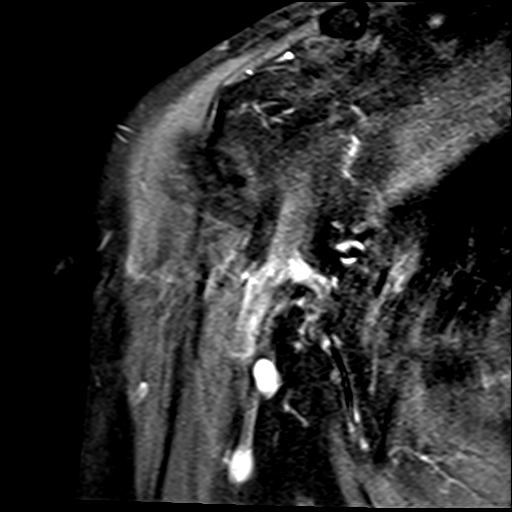
[im 13/26]
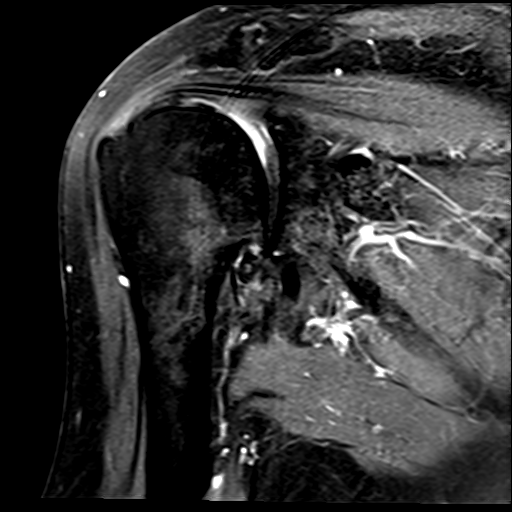

[38 of 40 positions shown; findings below may reference images not displayed]

FINDINGS: Rotator cuff: Intact supraspinatus, infraspinatus, teres minor and
subscapularis tendons.

Muscles: Slight atrophy of the subscapularis and supraspinatus
muscles. No muscle edema.

Biceps long head:  Properly located and intact.

Acromioclavicular Joint: Minimal degenerative changes. Type 3
acromion. No bursitis.

Glenohumeral Joint: The patient has moderately severe osteoarthritis
of the glenohumeral joint with denuding of the articular cartilage
of the humeral head and of the central and inferior aspects of the
glenoid. Small glenohumeral joint effusion. Prominent marginal
osteophytes on the inferior aspect of the humeral head.

Labrum: The labrum is not well seen but the visualized portions are
degenerated without a discrete tear.

Bones: Prominent arthritic changes of the humeral head with
extensive cystic degenerative changes in the lesser tuberosity and
at the inferior aspect of the humeral head in the prominent
osteophytes.

Other: No pathologic enhancement of the right shoulder after
contrast administration.
IMPRESSION: 1. Moderately severe osteoarthritis of the glenohumeral joint.
2. Intact rotator cuff.
3. No evidence of metastatic disease in or around the right
shoulder.
